# Patient Record
Sex: Female | Born: 1935 | Race: White | Hispanic: No | Smoking: Never smoker
Health system: Southern US, Community
[De-identification: ages and names within clinical notes are randomized; demographics above are authoritative.]

## PROBLEM LIST (undated history)

## (undated) DIAGNOSIS — K759 Inflammatory liver disease, unspecified: Secondary | ICD-10-CM

## (undated) DIAGNOSIS — M1990 Unspecified osteoarthritis, unspecified site: Secondary | ICD-10-CM

## (undated) DIAGNOSIS — E785 Hyperlipidemia, unspecified: Secondary | ICD-10-CM

## (undated) DIAGNOSIS — I10 Essential (primary) hypertension: Secondary | ICD-10-CM

## (undated) DIAGNOSIS — T7840XA Allergy, unspecified, initial encounter: Secondary | ICD-10-CM

## (undated) DIAGNOSIS — M899 Disorder of bone, unspecified: Secondary | ICD-10-CM

## (undated) DIAGNOSIS — D696 Thrombocytopenia, unspecified: Secondary | ICD-10-CM

## (undated) DIAGNOSIS — K219 Gastro-esophageal reflux disease without esophagitis: Secondary | ICD-10-CM

## (undated) DIAGNOSIS — F0390 Unspecified dementia without behavioral disturbance: Secondary | ICD-10-CM

## (undated) DIAGNOSIS — M949 Disorder of cartilage, unspecified: Secondary | ICD-10-CM

## (undated) HISTORY — DX: Hyperlipidemia, unspecified: E78.5

## (undated) HISTORY — DX: Disorder of cartilage, unspecified: M94.9

## (undated) HISTORY — PX: CARDIAC CATHETERIZATION: SHX172

## (undated) HISTORY — DX: Disorder of bone, unspecified: M89.9

## (undated) HISTORY — DX: Thrombocytopenia, unspecified: D69.6

## (undated) HISTORY — DX: Gastro-esophageal reflux disease without esophagitis: K21.9

## (undated) HISTORY — DX: Allergy, unspecified, initial encounter: T78.40XA

## (undated) HISTORY — DX: Essential (primary) hypertension: I10

## (undated) HISTORY — DX: Unspecified osteoarthritis, unspecified site: M19.90

## (undated) HISTORY — PX: ESOPHAGOGASTRODUODENOSCOPY: SHX1529

## (undated) HISTORY — DX: Inflammatory liver disease, unspecified: K75.9

---

## 2000-10-11 ENCOUNTER — Other Ambulatory Visit: Admission: RE | Admit: 2000-10-11 | Discharge: 2000-10-11 | Payer: Self-pay | Admitting: Family Medicine

## 2000-11-19 ENCOUNTER — Encounter: Admission: RE | Admit: 2000-11-19 | Discharge: 2000-11-19 | Payer: Self-pay | Admitting: Family Medicine

## 2000-11-19 ENCOUNTER — Encounter: Payer: Self-pay | Admitting: Family Medicine

## 2003-01-03 ENCOUNTER — Ambulatory Visit (HOSPITAL_COMMUNITY): Admission: RE | Admit: 2003-01-03 | Discharge: 2003-01-03 | Payer: Self-pay | Admitting: Gastroenterology

## 2003-01-03 ENCOUNTER — Encounter: Payer: Self-pay | Admitting: Gastroenterology

## 2004-12-29 ENCOUNTER — Ambulatory Visit: Payer: Self-pay | Admitting: Family Medicine

## 2005-01-01 ENCOUNTER — Ambulatory Visit: Payer: Self-pay | Admitting: Family Medicine

## 2005-01-27 ENCOUNTER — Ambulatory Visit: Payer: Self-pay | Admitting: Family Medicine

## 2005-04-09 ENCOUNTER — Ambulatory Visit: Payer: Self-pay | Admitting: Family Medicine

## 2005-06-03 ENCOUNTER — Ambulatory Visit: Payer: Self-pay | Admitting: Family Medicine

## 2005-07-16 ENCOUNTER — Ambulatory Visit: Payer: Self-pay | Admitting: Family Medicine

## 2005-09-04 ENCOUNTER — Ambulatory Visit: Payer: Self-pay | Admitting: Family Medicine

## 2005-09-07 ENCOUNTER — Ambulatory Visit: Payer: Self-pay | Admitting: Family Medicine

## 2005-09-21 ENCOUNTER — Ambulatory Visit: Payer: Self-pay | Admitting: Family Medicine

## 2005-10-08 ENCOUNTER — Ambulatory Visit: Payer: Self-pay | Admitting: Family Medicine

## 2005-11-24 ENCOUNTER — Ambulatory Visit: Payer: Self-pay | Admitting: Family Medicine

## 2006-02-09 ENCOUNTER — Ambulatory Visit: Payer: Self-pay | Admitting: Family Medicine

## 2006-03-10 ENCOUNTER — Ambulatory Visit: Payer: Self-pay | Admitting: Family Medicine

## 2006-06-04 ENCOUNTER — Ambulatory Visit: Payer: Self-pay | Admitting: Family Medicine

## 2006-09-09 ENCOUNTER — Ambulatory Visit: Payer: Self-pay | Admitting: Family Medicine

## 2006-10-01 ENCOUNTER — Ambulatory Visit: Payer: Self-pay | Admitting: Family Medicine

## 2007-10-04 ENCOUNTER — Ambulatory Visit: Payer: Self-pay | Admitting: Family Medicine

## 2008-08-10 ENCOUNTER — Ambulatory Visit: Payer: Self-pay | Admitting: Family Medicine

## 2008-08-10 DIAGNOSIS — D696 Thrombocytopenia, unspecified: Secondary | ICD-10-CM

## 2008-08-10 DIAGNOSIS — E785 Hyperlipidemia, unspecified: Secondary | ICD-10-CM | POA: Insufficient documentation

## 2008-08-10 DIAGNOSIS — M810 Age-related osteoporosis without current pathological fracture: Secondary | ICD-10-CM

## 2008-08-10 DIAGNOSIS — K219 Gastro-esophageal reflux disease without esophagitis: Secondary | ICD-10-CM | POA: Insufficient documentation

## 2008-08-10 DIAGNOSIS — I10 Essential (primary) hypertension: Secondary | ICD-10-CM | POA: Insufficient documentation

## 2008-08-10 DIAGNOSIS — J309 Allergic rhinitis, unspecified: Secondary | ICD-10-CM | POA: Insufficient documentation

## 2008-08-11 ENCOUNTER — Encounter: Payer: Self-pay | Admitting: Family Medicine

## 2008-08-14 ENCOUNTER — Encounter: Payer: Self-pay | Admitting: Family Medicine

## 2008-08-20 ENCOUNTER — Telehealth (INDEPENDENT_AMBULATORY_CARE_PROVIDER_SITE_OTHER): Payer: Self-pay | Admitting: *Deleted

## 2008-08-21 LAB — CONVERTED CEMR LAB
AST: 33 units/L (ref 0–37)
Albumin: 4.2 g/dL (ref 3.5–5.2)
Alkaline Phosphatase: 56 units/L (ref 39–117)
Basophils Absolute: 0 10*3/uL (ref 0.0–0.1)
Basophils Relative: 0.2 % (ref 0.0–3.0)
CO2: 28 meq/L (ref 19–32)
Calcium: 9.3 mg/dL (ref 8.4–10.5)
Cholesterol: 180 mg/dL (ref 0–200)
Creatinine, Ser: 0.9 mg/dL (ref 0.4–1.2)
Eosinophils Absolute: 0.2 10*3/uL (ref 0.0–0.7)
Eosinophils Relative: 2.7 % (ref 0.0–5.0)
GFR calc Af Amer: 78 mL/min
GFR calc non Af Amer: 64 mL/min
Hemoglobin: 13.1 g/dL (ref 12.0–15.0)
Lymphocytes Relative: 34.7 % (ref 12.0–46.0)
MCHC: 33.4 g/dL (ref 30.0–36.0)
MCV: 89.9 fL (ref 78.0–100.0)
Neutro Abs: 3.9 10*3/uL (ref 1.4–7.7)
Neutrophils Relative %: 53 % (ref 43.0–77.0)
RBC: 4.34 M/uL (ref 3.87–5.11)
Sodium: 139 meq/L (ref 135–145)
TSH: 1.78 microintl units/mL (ref 0.35–5.50)
Total Protein: 7.7 g/dL (ref 6.0–8.3)
WBC: 7.3 10*3/uL (ref 4.5–10.5)

## 2008-12-24 ENCOUNTER — Telehealth: Payer: Self-pay | Admitting: Family Medicine

## 2009-01-23 ENCOUNTER — Telehealth (INDEPENDENT_AMBULATORY_CARE_PROVIDER_SITE_OTHER): Payer: Self-pay | Admitting: *Deleted

## 2009-06-07 ENCOUNTER — Encounter (INDEPENDENT_AMBULATORY_CARE_PROVIDER_SITE_OTHER): Payer: Self-pay | Admitting: *Deleted

## 2009-07-11 ENCOUNTER — Telehealth: Payer: Self-pay | Admitting: Family Medicine

## 2009-07-17 ENCOUNTER — Telehealth: Payer: Self-pay | Admitting: Family Medicine

## 2009-12-09 ENCOUNTER — Telehealth: Payer: Self-pay | Admitting: Family Medicine

## 2009-12-11 ENCOUNTER — Ambulatory Visit: Payer: Self-pay | Admitting: Family Medicine

## 2009-12-14 LAB — CONVERTED CEMR LAB
AST: 33 units/L (ref 0–37)
Alkaline Phosphatase: 60 units/L (ref 39–117)
Basophils Relative: 0.6 % (ref 0.0–3.0)
Bilirubin, Direct: 0.1 mg/dL (ref 0.0–0.3)
Chloride: 102 meq/L (ref 96–112)
Cholesterol: 169 mg/dL (ref 0–200)
Eosinophils Absolute: 0.1 10*3/uL (ref 0.0–0.7)
Eosinophils Relative: 2.1 % (ref 0.0–5.0)
GFR calc non Af Amer: 64.03 mL/min (ref >60)
Glucose, Bld: 102 mg/dL — ABNORMAL HIGH (ref 70–99)
Hemoglobin: 13.1 g/dL (ref 12.0–15.0)
LDL Cholesterol: 57 mg/dL (ref 0–99)
Lymphocytes Relative: 27.3 % (ref 12.0–46.0)
MCHC: 33.2 g/dL (ref 30.0–36.0)
Monocytes Relative: 8.4 % (ref 3.0–12.0)
Neutro Abs: 4.2 10*3/uL (ref 1.4–7.7)
Neutrophils Relative %: 61.6 % (ref 43.0–77.0)
Phosphorus: 3.7 mg/dL (ref 2.3–4.6)
Potassium: 4.1 meq/L (ref 3.5–5.1)
RBC: 4.29 M/uL (ref 3.87–5.11)
Sodium: 137 meq/L (ref 135–145)
Total CHOL/HDL Ratio: 2
Total Protein: 8.3 g/dL (ref 6.0–8.3)
VLDL: 39.8 mg/dL (ref 0.0–40.0)
WBC: 6.8 10*3/uL (ref 4.5–10.5)

## 2009-12-16 ENCOUNTER — Encounter (INDEPENDENT_AMBULATORY_CARE_PROVIDER_SITE_OTHER): Payer: Self-pay | Admitting: *Deleted

## 2009-12-17 ENCOUNTER — Encounter: Payer: Self-pay | Admitting: Family Medicine

## 2010-02-25 ENCOUNTER — Telehealth: Payer: Self-pay | Admitting: Family Medicine

## 2010-02-25 ENCOUNTER — Ambulatory Visit: Payer: Self-pay | Admitting: Family Medicine

## 2010-02-26 ENCOUNTER — Encounter: Payer: Self-pay | Admitting: Family Medicine

## 2010-07-01 ENCOUNTER — Ambulatory Visit: Payer: Self-pay | Admitting: Family Medicine

## 2010-07-01 DIAGNOSIS — E559 Vitamin D deficiency, unspecified: Secondary | ICD-10-CM

## 2010-07-01 DIAGNOSIS — Z9181 History of falling: Secondary | ICD-10-CM

## 2010-07-03 LAB — CONVERTED CEMR LAB
ALT: 21 units/L (ref 0–35)
BUN: 14 mg/dL (ref 6–23)
Basophils Absolute: 0 10*3/uL (ref 0.0–0.1)
Chloride: 102 meq/L (ref 96–112)
Cholesterol: 173 mg/dL (ref 0–200)
Creatinine, Ser: 0.7 mg/dL (ref 0.4–1.2)
Direct LDL: 81 mg/dL
Eosinophils Absolute: 0.2 10*3/uL (ref 0.0–0.7)
GFR calc non Af Amer: 81.41 mL/min (ref >60)
HCT: 37.1 % (ref 36.0–46.0)
Lymphs Abs: 2.7 10*3/uL (ref 0.7–4.0)
MCV: 90.3 fL (ref 78.0–100.0)
Monocytes Absolute: 0.8 10*3/uL (ref 0.1–1.0)
Phosphorus: 3.8 mg/dL (ref 2.3–4.6)
Platelets: 120 10*3/uL — ABNORMAL LOW (ref 150.0–400.0)
RDW: 13.8 % (ref 11.5–14.6)
Sodium: 139 meq/L (ref 135–145)
Total CHOL/HDL Ratio: 3
Triglycerides: 228 mg/dL — ABNORMAL HIGH (ref 0.0–149.0)
VLDL: 45.6 mg/dL — ABNORMAL HIGH (ref 0.0–40.0)

## 2010-09-15 ENCOUNTER — Ambulatory Visit: Payer: Self-pay | Admitting: Family Medicine

## 2010-09-16 ENCOUNTER — Telehealth: Payer: Self-pay | Admitting: Family Medicine

## 2010-09-16 LAB — CONVERTED CEMR LAB
Basophils Absolute: 0 10*3/uL (ref 0.0–0.1)
Lymphocytes Relative: 32.9 % (ref 12.0–46.0)
Lymphs Abs: 2.4 10*3/uL (ref 0.7–4.0)
Monocytes Relative: 8.1 % (ref 3.0–12.0)
Neutrophils Relative %: 56.8 % (ref 43.0–77.0)
Platelets: 117 10*3/uL — ABNORMAL LOW (ref 150.0–400.0)
RDW: 13.6 % (ref 11.5–14.6)
Vit D, 25-Hydroxy: 38 ng/mL (ref 30–89)

## 2010-10-27 ENCOUNTER — Encounter: Payer: Self-pay | Admitting: Family Medicine

## 2010-12-29 ENCOUNTER — Encounter: Payer: Self-pay | Admitting: Family Medicine

## 2010-12-29 ENCOUNTER — Ambulatory Visit
Admission: RE | Admit: 2010-12-29 | Discharge: 2010-12-29 | Payer: Self-pay | Source: Home / Self Care | Attending: Family Medicine | Admitting: Family Medicine

## 2010-12-29 ENCOUNTER — Other Ambulatory Visit: Payer: Self-pay | Admitting: Family Medicine

## 2010-12-29 DIAGNOSIS — K120 Recurrent oral aphthae: Secondary | ICD-10-CM | POA: Insufficient documentation

## 2010-12-29 DIAGNOSIS — G3184 Mild cognitive impairment, so stated: Secondary | ICD-10-CM | POA: Insufficient documentation

## 2010-12-29 DIAGNOSIS — F0390 Unspecified dementia without behavioral disturbance: Secondary | ICD-10-CM | POA: Insufficient documentation

## 2010-12-30 LAB — CBC WITH DIFFERENTIAL/PLATELET
Basophils Absolute: 0 10*3/uL (ref 0.0–0.1)
Basophils Relative: 0.6 % (ref 0.0–3.0)
Eosinophils Absolute: 0.2 10*3/uL (ref 0.0–0.7)
Eosinophils Relative: 2.5 % (ref 0.0–5.0)
HCT: 39.6 % (ref 36.0–46.0)
Hemoglobin: 13.4 g/dL (ref 12.0–15.0)
Lymphocytes Relative: 31 % (ref 12.0–46.0)
Lymphs Abs: 2.1 10*3/uL (ref 0.7–4.0)
MCHC: 33.8 g/dL (ref 30.0–36.0)
MCV: 87.3 fl (ref 78.0–100.0)
Monocytes Absolute: 0.6 10*3/uL (ref 0.1–1.0)
Monocytes Relative: 8.8 % (ref 3.0–12.0)
Neutro Abs: 3.9 10*3/uL (ref 1.4–7.7)
Neutrophils Relative %: 57.1 % (ref 43.0–77.0)
Platelets: 126 10*3/uL — ABNORMAL LOW (ref 150.0–400.0)
RBC: 4.53 Mil/uL (ref 3.87–5.11)
RDW: 13.6 % (ref 11.5–14.6)
WBC: 6.8 10*3/uL (ref 4.5–10.5)

## 2010-12-30 LAB — SEDIMENTATION RATE: Sed Rate: 21 mm/hr (ref 0–22)

## 2010-12-30 LAB — T4, FREE: Free T4: 0.65 ng/dL (ref 0.60–1.60)

## 2010-12-30 LAB — LIPID PANEL
Cholesterol: 176 mg/dL (ref 0–200)
HDL: 61.8 mg/dL (ref >39.00)
LDL Cholesterol: 81 mg/dL (ref 0–99)
Total CHOL/HDL Ratio: 3
Triglycerides: 168 mg/dL — ABNORMAL HIGH (ref 0.0–149.0)
VLDL: 33.6 mg/dL (ref 0.0–40.0)

## 2010-12-30 LAB — RENAL FUNCTION PANEL
Albumin: 4.2 g/dL (ref 3.5–5.2)
BUN: 15 mg/dL (ref 6–23)
CO2: 27 mEq/L (ref 19–32)
Calcium: 9.6 mg/dL (ref 8.4–10.5)
Chloride: 103 mEq/L (ref 96–112)
Creatinine, Ser: 1 mg/dL (ref 0.4–1.2)
GFR: 59.99 mL/min — ABNORMAL LOW (ref >60.00)
Glucose, Bld: 85 mg/dL (ref 70–99)
Phosphorus: 3.9 mg/dL (ref 2.3–4.6)
Potassium: 4.4 mEq/L (ref 3.5–5.1)
Sodium: 139 mEq/L (ref 135–145)

## 2010-12-30 LAB — B12 AND FOLATE PANEL
Folate: >20 ng/mL
Vitamin B-12: 387 pg/mL (ref 211–911)

## 2010-12-30 LAB — AST: AST: 29 U/L (ref 0–37)

## 2010-12-30 LAB — ALT: ALT: 23 U/L (ref 0–35)

## 2010-12-30 LAB — TSH: TSH: 2.71 u[IU]/mL (ref 0.35–5.50)

## 2010-12-31 ENCOUNTER — Telehealth: Payer: Self-pay | Admitting: Family Medicine

## 2011-01-03 LAB — CONVERTED CEMR LAB: Vit D, 25-Hydroxy: 23 ng/mL — ABNORMAL LOW (ref 30–89)

## 2011-01-20 NOTE — Miscellaneous (Signed)
Summary: Flu vaccine   Clinical Lists Changes  Observations: Added new observation of FLU VAX: Historical (10/21/2010 16:53)      Influenza Immunization History:    Influenza # 1:  Historical (10/21/2010) Received form from [facility], [address] [place], [place]

## 2011-01-20 NOTE — Progress Notes (Signed)
Summary: Request med for allergies and sinus  Phone Note Call from Patient Call back at [phone]   Caller: Patient Call For: Judith Part MD Summary of Call: [facility] on [address] does not have Allergra as prescription drug since it went over the counter. I spoke with rebecca at [facility] aid and she said they could not get it since went OTC. Lurena Joiner said generic Claritin and generic Zyrtec are available for prescription if Dr Milinda Antis wanted to use those as an option. Pt said cannot afford OTC and wants [facility] to call in prescription med for sinus and allergies. Pt uses [facility] on [address]. [phone]. Please advise.  Initial call taken by: Lewanda Rife LPN,  September 16, 2010 4:28 PM  Follow-up for Phone Call        she is most likely going to have to use one of these meds otc ... just so she knows store brands (like [facility] or [facility] ) are ok and may be cheaper she can also use her nasonex ns (let me know if she needs px)- this should help as well Follow-up by: Judith Part MD,  September 16, 2010 4:53 PM  Additional Follow-up for Phone Call Additional follow up Details #1::        Patient notified as instructed by telephone. Lewanda Rife LPN  September 16, 2010 5:15 PM

## 2011-01-20 NOTE — Assessment & Plan Note (Signed)
Summary: 6 month follow up /rbh   Vital Signs:  Patient profile:   77 year old female Height:      60 inches Weight:      151.50 pounds BMI:     29.69 Temp:     98 degrees F oral Pulse rate:   72 / minute Pulse rhythm:   regular BP sitting:   146 / 70  (left arm) Cuff size:   regular  Vitals Entered By: Lewanda Rife LPN (July 01, 2010 2:09 PM) CC: six month f/u   History of Present Illness: here for f/u of chronic health problems incl HTN and lipids/ OP and low platelets and low vit D  had a fall the other day and got a knot on her head  slid out of her shoes with hose and tripped - was wearing sandals  she has since thrown the shoes away  tumbled face down on the floor  head feels better - lump is almost gone -- still a little tender no headache or nausea   otherwise doing ok -- more tired as she gets older    has had 2 rounds of high dose vit d currently takes none -- forgot   last LDL on zocor was 57- good control  bp at arrival is 146.70  OP - did not get her bone density test last time-- does not have a ride so cannot go for a while    last platelet ct was 140 improved  no bruising or easy bleeding that she has noticed   Allergies: 1)  ! Ace Inhibitors 2)  ! Fosamax  Past History:  Past Surgical History: Last updated: 08/10/2008 ENG 01 [place] abd nl 03 2D echo nl EF 95 heart cath 04 colonosc hemorroids 04 EGD gastritis, HH, duodenitis  05 cataract sx 05 abd [place] neg  comp fx LS -- OP (dexa 99 OP)   Family History: Last updated: 08/10/2008 mother- alzheimers   Social History: Last updated: 07/01/2010 does word puzzles to keep memory sharp  enjoys reading is active but no longer drives   Past Medical History: HTN  wrist fractures , OP hyperlipidemia all rhinitis  thrombocytomenia- mild  pulm HTN GERD (hx of duodenal ulcer) hepatitis  OA osteopenia  cardiol [facility]  [facility]o [facility]   Social History: does word puzzles to keep  memory sharp  enjoys reading is active but no longer drives   Review of Systems General:  Denies fatigue, loss of appetite, and malaise. Eyes:  Denies blurring and eye irritation. CV:  Denies chest pain or discomfort, palpitations, shortness of breath with exertion, and swelling of feet. Resp:  Denies cough, shortness of breath, and wheezing. GI:  Denies abdominal pain, change in bowel habits, and indigestion. GU:  Denies dysuria and urinary frequency. MS:  Complains of stiffness; denies cramps and muscle weakness. Derm:  Denies lesion(s), poor wound healing, and rash. Neuro:  Denies numbness and tingling. Psych:  Denies anxiety and depression; more trouble with short term memory- but mild. Endo:  Denies cold intolerance, excessive thirst, excessive urination, and heat intolerance. Heme:  Denies abnormal bruising and bleeding.  Physical Exam  General:  well appearing elderly female Head:  normocephalic and no abnormalities observed.  very small lump on L side of head - mildly tender without bruise no facial changes  Eyes:  vision grossly intact, pupils equal, pupils round, and pupils reactive to light.  no conjunctival pallor, injection or icterus  Nose:  no nasal discharge.  Mouth:  pharynx pink and moist.   Neck:  supple with full rom and no masses or thyromegally, no JVD or carotid bruit  Chest Wall:  No deformities, masses, or tenderness noted. Lungs:  Normal respiratory effort, chest expands symmetrically. Lungs are clear to auscultation, no crackles or wheezes. Heart:  Normal rate and regular rhythm. S1 and S2 normal without gallop, murmur, click, rub or other extra sounds. Abdomen:  Bowel sounds positive,abdomen soft and non-tender without masses, organomegaly or hernias noted. no renal bruits  Msk:  No deformity or scoliosis noted of thoracic or lumbar spine.  some OA changes  no joint tenderness Pulses:  plus one dp pulses  Extremities:  No clubbing, cyanosis, edema, or  deformity noted with normal full range of motion of all joints.   Neurologic:  sensation intact to light touch, gait normal, and DTRs symmetrical and normal.  balance is not comprimised  Skin:  Intact without suspicious lesions or rashes fair complexion without pallor  no ecchymoses Cervical Nodes:  No lymphadenopathy noted Inguinal Nodes:  No significant adenopathy Psych:  normal affect, talkative and pleasant  pt talks about general memory problems but is very mentally sharp today     Impression & Recommendations:  Problem # 1:  UNSPECIFIED VITAMIN D DEFICIENCY (ICD-268.9) Assessment Unchanged pt has not been compliant with dosing explained imp to her bone and overall health will start 2000 international units per day check level today (did have 2 rounds of high dose tx) Orders: Venipuncture (54098) TLB-Lipid Panel (80061-LIPID) TLB-Renal Function Panel (80069-RENAL) TLB-ALT (SGPT) (84460-ALT) TLB-AST (SGOT) (84450-SGOT) TLB-TSH (Thyroid Stimulating Hormone) (84443-TSH) T-Vitamin D (25-Hydroxy) (11914-78295) Specimen Handling (62130)  Problem # 2:  OSTEOPOROSIS (ICD-733.00) Assessment: Unchanged with need for f/u dexa - but pt unable to go until she has a ride she has the order when she gets the chance rev ca and vit D req  continues boniva Her updated medication list for this problem includes:    Boniva 150 Mg Tabs (Ibandronate sodium) .Marland Kitchen... 1 by mouth once monthly    Calcium 600/vitamin D 600-200 Mg-unit Tabs (Calcium carbonate-vitamin d) .Marland Kitchen... Take 1 tablet by mouth two times a day    Vitamin D (ergocalciferol) 50000 Unit Caps (Ergocalciferol) .Marland Kitchen... Take one by mouth weekly x 10 weeks    Vitamin D 2000 Unit Tabs (Cholecalciferol) .Marland Kitchen... Take one tablet daily  Orders: Venipuncture (86578) TLB-Lipid Panel (80061-LIPID) TLB-Renal Function Panel (80069-RENAL) TLB-ALT (SGPT) (84460-ALT) TLB-AST (SGOT) (84450-SGOT) TLB-TSH (Thyroid Stimulating Hormone)  (84443-TSH)  Problem # 3:  THROMBOCYTOPENIA, CHRONIC (ICD-287.5) Assessment: Improved check labs today- platelet up to 140 last time no bleeding or bruising  Orders: Venipuncture (46962) TLB-Lipid Panel (80061-LIPID) TLB-Renal Function Panel (80069-RENAL) TLB-ALT (SGPT) (84460-ALT) TLB-AST (SGOT) (84450-SGOT) TLB-TSH (Thyroid Stimulating Hormone) (84443-TSH) TLB-CBC Platelet - w/Differential (85025-CBCD)  Problem # 4:  ESSENTIAL HYPERTENSION (ICD-401.9) Assessment: Improved  this is stable without med change reminded to avoid salt in diet  Her updated medication list for this problem includes:    Toprol Xl 50 Mg Tb24 (Metoprolol succinate) .Marland Kitchen... Take 1 tablet by mouth once a day    Diovan 80 Mg Tabs (Valsartan) .Marland Kitchen... 1 by mouth once daily  Orders: Venipuncture (95284) TLB-Lipid Panel (80061-LIPID) TLB-Renal Function Panel (80069-RENAL) TLB-ALT (SGPT) (84460-ALT) TLB-AST (SGOT) (84450-SGOT) TLB-TSH (Thyroid Stimulating Hormone) (84443-TSH)  BP today: 146/70-- re check 140/80 at rest  Prior BP: 154/74 (12/11/2009)  Labs Reviewed: K+: 4.1 (12/11/2009) Creat: : 0.9 (12/11/2009)   Chol: 169 (12/11/2009)   HDL: 72.50 (12/11/2009)  LDL: 57 (12/11/2009)   TG: 199.0 (12/11/2009)  Problem # 5:  HYPERLIPIDEMIA (ICD-272.4) Assessment: Unchanged  this has been well controlled on simvastatin and diet  lab today and update  f/u 6 mo  Her updated medication list for this problem includes:    Simvastatin 20 Mg Tabs (Simvastatin) .Marland Kitchen... Take 1 tablet by mouth once a day  Orders: Venipuncture (95284) TLB-Lipid Panel (80061-LIPID) TLB-Renal Function Panel (80069-RENAL) TLB-ALT (SGPT) (84460-ALT) TLB-AST (SGOT) (84450-SGOT) TLB-TSH (Thyroid Stimulating Hormone) (84443-TSH)  Labs Reviewed: SGOT: 33 (12/11/2009)   SGPT: 27 (12/11/2009)   HDL:72.50 (12/11/2009), 67.6 (08/10/2008)  LDL:57 (12/11/2009), 83 (08/10/2008)  Chol:169 (12/11/2009), 180 (08/10/2008)  Trig:199.0 (12/11/2009),  145 (08/10/2008)  Problem # 6:  FALL, HX OF (ICD-V15.88) Assessment: New full recovery with bump on head diminished and no fx  disc safety in shoe wear and risk of fx with OP  overall balance is fair for her age   Complete Medication List: 1)  Boniva 150 Mg Tabs (Ibandronate sodium) .Marland Kitchen.. 1 by mouth once monthly 2)  Simvastatin 20 Mg Tabs (Simvastatin) .... Take 1 tablet by mouth once a day 3)  Cod Liver Oil Caps (Cod liver oil) .... Take 1 capsule by mouth once a day as needed 4)  Allegra 180 Mg Tabs (Fexofenadine hcl) .... Take 1 tablet by mouth once a day as needed 5)  Toprol Xl 50 Mg Tb24 (Metoprolol succinate) .... Take 1 tablet by mouth once a day 6)  Calcium 600/vitamin D 600-200 Mg-unit Tabs (Calcium carbonate-vitamin d) .... Take 1 tablet by mouth two times a day 7)  Nasonex 50 Mcg/act Susp (Mometasone furoate) .... As needed 8)  Relafen 500 Mg  .... As needed 9)  Diovan 80 Mg Tabs (Valsartan) .Marland Kitchen.. 1 by mouth once daily 10)  Vitamin D (ergocalciferol) 50000 Unit Caps (Ergocalciferol) .... Take one by mouth weekly x 10 weeks 11)  Vitamin D 2000 Unit Tabs (Cholecalciferol) .... Take one tablet daily  Patient Instructions: 1)  you absolutely have to take calcium 1200 mg daily ( 600 in am 600 in pm)  2)  and you need 2000 international units of vitamin D per day  3)  this is really important for your bones and may make you feel better too  4)  go for your bone density test when you have a chance 5)  labs today 6)  follow up with me in about 6 months   Current Allergies (reviewed today): ! ACE INHIBITORS ! [facility]

## 2011-01-20 NOTE — Miscellaneous (Signed)
Summary: Vitamin D 2000 iu update on med list  Medications Added VITAMIN D 2000 UNIT TABS (CHOLECALCIFEROL) take one tablet daily       Clinical Lists Changes  Medications: Added new medication of VITAMIN D 2000 UNIT TABS (CHOLECALCIFEROL) take one tablet daily     Current Allergies: ! ACE INHIBITORS ! FOSAMAX

## 2011-01-20 NOTE — Progress Notes (Signed)
Summary: rash on leg  Phone Note Call from Patient Call back at Home Phone [phone]   Caller: Patient Call For: Judith Part MD Summary of Call: Patient says that she has been having rash on her legs right above ankle. This has been going on for quite some time. She says that it gets worse the more she scratches it. I suggested that she make an appointment, but she denied it. She says that she would just like for you to either call her in soemething for the rash or giver her suggestions on what to use for it. Uses [facility] [address] if needed. Initial call taken by: Melody Comas,  February 25, 2010 2:17 PM  Follow-up for Phone Call        I cannot tx without evaluating to see what type of rash  she can try some cort aid otc but if no imp f/u  Follow-up by: Judith Part MD,  February 25, 2010 2:33 PM  Additional Follow-up for Phone Call Additional follow up Details #1::        Patient notified as instructed by telephone. Lewanda Rife LPN  February 26, 980 3:16 PM

## 2011-01-22 NOTE — Assessment & Plan Note (Signed)
Summary: ROA FOR 6 MONTH FOLLOW-UP/JRR   Vital Signs:  Patient profile:   77 year old female Height:      60 inches Weight:      147.25 pounds BMI:     28.86 Temp:     97.7 degrees F oral Pulse rate:   76 / minute Pulse rhythm:   regular BP sitting:   156 / 72  (left arm) Cuff size:   regular  Vitals Entered By: Lewanda Rife LPN (December 29, 2010 2:15 PM) CC: six month f/u, tongue bothers her and pt wants to discuss memory.   History of Present Illness: here for f/u of HTN and lpids and thrombocytopenia  also acute problem of toungue bothering her and memory problem   wt is down 4 lb  HTN - first bp check is 156/72 is not entirely sure she took her med this am    lipids were good in july on statin takes statin every day  diet is fair overall    due for platelet check - watching asymptomatic thrombocytopenia  not bruising easily   in past she used to take vit B12  will do some memory labs this am  forgetting to take her medicines  is a Product/process development scientist -- forgets more when she worries  occ forgets to leave light on in the kitchen   some trouble sleeping at times  gets confused when she gets upset   may have forgotton to take ca and vit D   stopped allegra since it went over the counter   her toungue is bothering her -- soreness , over sides of her toungue  occ burns her tougue         Allergies: 1)  ! Ace Inhibitors 2)  ! Fosamax  Past History:  Past Medical History: Last updated: 07/01/2010 HTN  wrist fractures , OP hyperlipidemia all rhinitis  thrombocytomenia- mild  pulm HTN GERD (hx of duodenal ulcer) hepatitis  OA osteopenia  cardiol [facility] [place] [facility]   Past Surgical History: Last updated: 08/10/2008 ENG 01 [place] abd nl 03 2D echo nl EF 95 heart cath 04 colonosc hemorroids 04 EGD gastritis, HH, duodenitis  05 cataract sx 05 abd [place] neg  comp fx LS -- OP (dexa 99 OP)   Family History: Last updated: 08/10/2008 mother-  alzheimers   Social History: Last updated: 07/01/2010 does word puzzles to keep memory sharp  enjoys reading is active but no longer drives   Review of Systems General:  Denies fatigue, loss of appetite, and malaise. Eyes:  Denies blurring and eye irritation. ENT:  Denies postnasal drainage and sore throat. CV:  Denies chest pain or discomfort, lightheadness, palpitations, shortness of breath with exertion, and swelling of feet. Resp:  Denies cough, shortness of breath, and wheezing. GI:  Denies abdominal pain, bloody stools, change in bowel habits, and indigestion. MS:  Denies cramps and muscle weakness. Derm:  Denies itching, lesion(s), poor wound healing, and rash. Neuro:  Complains of memory loss; denies falling down, headaches, numbness, and tingling. Psych:  Complains of anxiety; denies panic attacks, sense of great danger, and suicidal thoughts/plans. Endo:  Denies cold intolerance, excessive thirst, excessive urination, and heat intolerance. Heme:  Denies abnormal bruising and bleeding.  Physical Exam  General:  well appearing elderly female Head:  normocephalic, atraumatic, and no abnormalities observed.  no sinus tenderness  Eyes:  vision grossly intact, pupils equal, pupils round, and pupils reactive to light.  no conjunctival pallor, injection  or icterus  Ears:  R ear normal and L ear normal.   Nose:  no nasal discharge.   Mouth:  pharynx pink and moist.   some irritation on sides of tougue (suspect from malfitting dentures) throat is clear no thrush or coating noted  Neck:  supple with full rom and no masses or thyromegally, no JVD or carotid bruit  Chest Wall:  No deformities, masses, or tenderness noted. Lungs:  Normal respiratory effort, chest expands symmetrically. Lungs are clear to auscultation, no crackles or wheezes. Heart:  Normal rate and regular rhythm. S1 and S2 normal without gallop, murmur, click, rub or other extra sounds. Abdomen:  Bowel sounds  positive,abdomen soft and non-tender without masses, organomegaly or hernias noted. Msk:  No deformity or scoliosis noted of thoracic or lumbar spine.  some OA changes  no joint tenderness Pulses:  plus one dp pulses  Extremities:  No clubbing, cyanosis, edema, or deformity noted with normal full range of motion of all joints.   Neurologic:  sensation intact to light touch, gait normal, and DTRs symmetrical and normal.  balance is not comprimised  Skin:  Intact without suspicious lesions or rashes Cervical Nodes:  No lymphadenopathy noted Psych:  pt is forgetful - short term memory  is very confused about medicines- moreso when they are explained to her  tends not to listen or concentrate - with flight of ideas and frequent interruptions (interjects things randomly into conversation) no signs of depression - but may be a bit anxious     Impression & Recommendations:  Problem # 1:  MEMORY LOSS (ICD-780.93) Assessment New pt is concerned about this and seems easily confused- esp about meds  will start needing some help at home labs today for memory loss f/u with friend or family member to do MMS exam and disc further  Orders: Venipuncture (40981) TLB-Lipid Panel (80061-LIPID) TLB-Renal Function Panel (80069-RENAL) TLB-CBC Platelet - w/Differential (85025-CBCD) TLB-TSH (Thyroid Stimulating Hormone) (84443-TSH) TLB-B12 + Folate Pnl (19147_82956-O13/YQM) TLB-ALT (SGPT) (84460-ALT) TLB-AST (SGOT) (84450-SGOT) TLB-T4 (Thyrox), Free [id number]) TLB-Sedimentation Rate (ESR) (85652-ESR) T-Vitamin D (25-Hydroxy) (52841-32440)  Problem # 2:  UNSPECIFIED VITAMIN D DEFICIENCY (ICD-268.9) Assessment: Deteriorated pt forgets to take her vit D otc stressed imp of this  rev med list in detail check level Orders: Venipuncture (10272) TLB-Lipid Panel (80061-LIPID) TLB-Renal Function Panel (80069-RENAL) TLB-CBC Platelet - w/Differential (85025-CBCD) TLB-TSH (Thyroid Stimulating Hormone)  (84443-TSH) TLB-B12 + Folate Pnl (53664_40347-Q25/ZDG) TLB-ALT (SGPT) (84460-ALT) TLB-AST (SGOT) (84450-SGOT) TLB-T4 (Thyrox), Free (38756-EP3I) TLB-Sedimentation Rate (ESR) (85652-ESR) T-Vitamin D (25-Hydroxy) (95188-41660) Specimen Handling (63016)  Problem # 3:  THROMBOCYTOPENIA, CHRONIC (ICD-287.5) Assessment: Unchanged check cbc  no bruising or other symptoms mild and being observed   Problem # 4:  ESSENTIAL HYPERTENSION (ICD-401.9) Assessment: Deteriorated  bp up -- probably forgot her med this am- she is not sure  will re check at next visit  Her updated medication list for this problem includes:    Toprol Xl 50 Mg Tb24 (Metoprolol succinate) .Marland Kitchen... Take 1 tablet by mouth once a day    Diovan 80 Mg Tabs (Valsartan) .Marland Kitchen... 1 by mouth once daily  Orders: Venipuncture (01093) TLB-Lipid Panel (80061-LIPID) TLB-Renal Function Panel (80069-RENAL) TLB-CBC Platelet - w/Differential (85025-CBCD) TLB-TSH (Thyroid Stimulating Hormone) (84443-TSH) TLB-B12 + Folate Pnl (23557_32202-R42/HCW) TLB-ALT (SGPT) (84460-ALT) TLB-AST (SGOT) (84450-SGOT) TLB-T4 (Thyrox), Free [phone]) TLB-Sedimentation Rate (ESR) (85652-ESR) T-Vitamin D (25-Hydroxy) (17616-07371)  BP today: 156/72 Prior BP: 146/70 (07/01/2010)  Labs Reviewed: K+: 4.3 (07/01/2010) Creat: : 0.7 (07/01/2010)   Chol: 173 (  07/01/2010)   HDL: 66.30 (07/01/2010)   LDL: 57 (12/11/2009)   TG: 228.0 (07/01/2010)  Problem # 5:  HYPERLIPIDEMIA (ICD-272.4) Assessment: Unchanged  lipds today on statin and fair diet rev low sat fat diet  Her updated medication list for this problem includes:    Simvastatin 20 Mg Tabs (Simvastatin) .Marland Kitchen... Take 1 tablet by mouth once a day  Orders: Venipuncture (16109) TLB-Lipid Panel (80061-LIPID) TLB-Renal Function Panel (80069-RENAL) TLB-CBC Platelet - w/Differential (85025-CBCD) TLB-TSH (Thyroid Stimulating Hormone) (84443-TSH) TLB-B12 + Folate Pnl (60454_09811-B14/NWG) TLB-ALT (SGPT)  (84460-ALT) TLB-AST (SGOT) (84450-SGOT) TLB-T4 (Thyrox), Free [phone]) TLB-Sedimentation Rate (ESR) (85652-ESR) T-Vitamin D (25-Hydroxy) [id number])  Labs Reviewed: SGOT: 27 (07/01/2010)   SGPT: 21 (07/01/2010)   HDL:66.30 (07/01/2010), 72.50 (12/11/2009)  LDL:57 (12/11/2009), 83 (08/10/2008)  Chol:173 (07/01/2010), 169 (12/11/2009)  Trig:228.0 (07/01/2010), 199.0 (12/11/2009)  Problem # 6:  ORAL APHTHAE (ICD-528.2) Assessment: New I suspect this is intermittent from malfitting dentures  pt cannot afford new ones at this time she will see dentist to work out payment plan to get new dentures fitted  trial of dukes magic mouthwash also checking B12 and folate  Complete Medication List: 1)  Boniva 150 Mg Tabs (Ibandronate sodium) .Marland Kitchen.. 1 by mouth once monthly 2)  Simvastatin 20 Mg Tabs (Simvastatin) .... Take 1 tablet by mouth once a day 3)  Toprol Xl 50 Mg Tb24 (Metoprolol succinate) .... Take 1 tablet by mouth once a day 4)  Calcium 600/vitamin D 600-200 Mg-unit Tabs (Calcium carbonate-vitamin d) .... Take 1 tablet by mouth two times a day 5)  Nasonex 50 Mcg/act Susp (Mometasone furoate) .... As needed 6)  Diovan 80 Mg Tabs (Valsartan) .Marland Kitchen.. 1 by mouth once daily 7)  Vitamin D 2000 Unit Tabs (Cholecalciferol) .... Take one tablet daily 8)  Dukes Magic Mouthwash  .... Swish and spit 1 teaspoon two times a day as needed for mouth discomfort  Patient Instructions: 1)  let me know when you get home if you took your med or not -- just leave a messager with the triage nurse  2)  follow up with your dentist if you do not think dentures fit  3)  try the magic mouthwash for mouth discomfort - here is a px  4)  labs today- for cholesterol , platelet count, vitaminD and some labs for memory loss 5)  please follow up with a friend or family member (this is most important) to discuss memory loss when able (30 min appt)  6)  we will review your labs at next visit as well  7)  you must get  back on your calcium and vitamin D ! 8)  follow your medicine list  9)  put your pill box where you can see it more easily Prescriptions: DUKES MAGIC MOUTHWASH swish and spit 1 teaspoon two times a day as needed for mouth discomfort  #120cc x 0   Entered and Authorized by:   Judith Part MD   Signed by:   Judith Part MD on 12/29/2010   Method used:   Print then Give to Patient   RxID:   [id number]    Orders Added: 1)  Venipuncture [66440] 2)  TLB-Lipid Panel [80061-LIPID] 3)  TLB-Renal Function Panel [80069-RENAL] 4)  TLB-CBC Platelet - w/Differential [85025-CBCD] 5)  TLB-TSH (Thyroid Stimulating Hormone) [84443-TSH] 6)  TLB-B12 + Folate Pnl [82746_82607-B12/FOL] 7)  TLB-ALT (SGPT) [84460-ALT] 8)  TLB-AST (SGOT) [84450-SGOT] 9)  TLB-T4 (Thyrox), Free [34742-VZ5G] 10)  TLB-Sedimentation Rate (ESR) [85652-ESR] 11)  T-Vitamin  D (25-Hydroxy) [phone] 12)  Specimen Handling [99000] 13)  Est. Patient Level IV [27253]    Current Allergies (reviewed today): ! ACE INHIBITORS ! FOSAMAX

## 2011-01-22 NOTE — Progress Notes (Signed)
Summary: does pt need to be seen again?  Phone Note Call from Patient   Caller: Daughter Amanda Salas [phone] Call For: Judith Part MD Summary of Call: Pt's daughter is asking if pt needs to come in for memory test.  Pt was seen on monday. Initial call taken by: Lowella Petties CMA, AAMA,  December 31, 2010 10:49 AM  Follow-up for Phone Call        I asked her to follow up with friend or family membert to review labs and do memory test  I am worried about her memory -- and have a difficult time communicating things with her please f/u with pt whenever your schedule allows (please make appt 30 min)  Follow-up by: Judith Part MD,  December 31, 2010 12:15 PM  Additional Follow-up for Phone Call Additional follow up Details #1::        Left message for Amanda Salas  to call back. Lewanda Rife LPN  December 31, 2010 1:32 PM   Amanda Salas called back and said she would talk with pt and then call back and schedule a 30 min appt with Dr. Milinda Antis.Lewanda Rife LPN  December 31, 2010 1:48 PM

## 2011-01-23 NOTE — Letter (Signed)
Summary: Patient Assessment Form/[employer]  Patient Assessment Form/[employer]   Imported By: Lanelle Bal 12/23/2009 12:20:37  _____________________________________________________________________  External Attachment:    Type:   Image     Comment:   External Document

## 2011-06-06 ENCOUNTER — Other Ambulatory Visit: Payer: Self-pay | Admitting: Family Medicine

## 2011-08-07 ENCOUNTER — Other Ambulatory Visit: Payer: Self-pay | Admitting: Family Medicine

## 2011-08-07 NOTE — Telephone Encounter (Signed)
[facility] request refill for Diovan 80mg  #30 x 1.Spoke with pt and she has to get a ride to her appts; so she will ck with the person that will bring her to the appt about available times she could bring pt and pt will call back for appt.

## 2011-10-06 ENCOUNTER — Other Ambulatory Visit: Payer: Self-pay | Admitting: Family Medicine

## 2011-11-04 ENCOUNTER — Encounter: Payer: Self-pay | Admitting: Internal Medicine

## 2011-11-04 ENCOUNTER — Ambulatory Visit (INDEPENDENT_AMBULATORY_CARE_PROVIDER_SITE_OTHER): Payer: PRIVATE HEALTH INSURANCE | Admitting: Internal Medicine

## 2011-11-04 VITALS — BP 190/80 | HR 93 | Temp 98.2°F | Ht 60.0 in | Wt 145.0 lb

## 2011-11-04 DIAGNOSIS — L259 Unspecified contact dermatitis, unspecified cause: Secondary | ICD-10-CM

## 2011-11-04 DIAGNOSIS — R413 Other amnesia: Secondary | ICD-10-CM

## 2011-11-04 DIAGNOSIS — B351 Tinea unguium: Secondary | ICD-10-CM

## 2011-11-04 DIAGNOSIS — Z23 Encounter for immunization: Secondary | ICD-10-CM

## 2011-11-04 DIAGNOSIS — I10 Essential (primary) hypertension: Secondary | ICD-10-CM

## 2011-11-04 LAB — CBC WITH DIFFERENTIAL/PLATELET
Basophils Absolute: 0 10*3/uL (ref 0.0–0.1)
Eosinophils Absolute: 0.2 10*3/uL (ref 0.0–0.7)
HCT: 39.2 % (ref 36.0–46.0)
Hemoglobin: 12.9 g/dL (ref 12.0–15.0)
Lymphs Abs: 2.5 10*3/uL (ref 0.7–4.0)
MCHC: 32.9 g/dL (ref 30.0–36.0)
Monocytes Relative: 7.3 % (ref 3.0–12.0)
Neutro Abs: 5.1 10*3/uL (ref 1.4–7.7)
RDW: 14.9 % — ABNORMAL HIGH (ref 11.5–14.6)

## 2011-11-04 MED ORDER — METOPROLOL SUCCINATE ER 50 MG PO TB24: 50.0000 mg | ORAL_TABLET | Freq: Every day | ORAL | Status: DC

## 2011-11-04 MED ORDER — PREDNISONE 20 MG PO TABS: 40.0000 mg | ORAL_TABLET | Freq: Every day | ORAL | Status: AC

## 2011-11-04 MED ORDER — VALSARTAN 80 MG PO TABS: 40.0000 mg | ORAL_TABLET | Freq: Every day | ORAL | Status: DC

## 2011-11-04 NOTE — Assessment & Plan Note (Signed)
Persists Hasn't kept up with follow up Will stop statin now in case this could be related Follow up in 1 month with Dr Milinda Antis

## 2011-11-04 NOTE — Progress Notes (Signed)
Addended by: Sueanne Margarita on: 11/04/2011 12:37 PM   Modules accepted: Orders

## 2011-11-04 NOTE — Progress Notes (Signed)
  Subjective:    Patient ID: Amanda Salas, female    DOB: 10-13-1927, 77 y.o.   MRN: [id number]  HPI Rash started on calves about 1-2 weeks ago Mild and then worsened Knows she has poison oak or ivy in yard--may have been exposed right before rash started  Had dye from black shoe also come out on her foot   Bad itching Has tried soaking in hot water and various OTC creams  Bothered by thick right great toenail Unable to cut it herself  Still notes memory loss Not necessarily worse than last visit in January Ran out of one of her meds this week, but doesn't know which one  Current Outpatient Prescriptions on File Prior to Visit  Medication Sig Dispense Refill  . DIOVAN 80 MG tablet take 1 tablet by mouth once daily  --NEEDS APPOINTMENT  30 tablet  1    Allergies  Allergen Reactions  . Ace Inhibitors     REACTION: cough  . Alendronate Sodium     REACTION: pill got stuck    Past Medical History  Diagnosis Date  . Hypertension   . Hyperlipidemia   . Allergy   . Thrombocytopenia, unspecified   . GERD (gastroesophageal reflux disease)   . Arthritis   . Disorder of bone and cartilage, unspecified   . Hepatitis   . Cataract     Past Surgical History  Procedure Date  . Esophagogastroduodenoscopy   . Cardiac catheterization     Family History  Problem Relation Age of Onset  . Alzheimer's disease Mother     History   Social History  . Marital Status: Legally Separated    Spouse Name: N/A    Number of Children: 1  . Years of Education: N/A   Occupational History  . Not on file.   Social History Main Topics  . Smoking status: Never Smoker   . Smokeless tobacco: Never Used  . Alcohol Use: No  . Drug Use: No  . Sexually Active:    Other Topics Concern  . Not on file   Social History Narrative   does word puzzles to keep memory sharp enjoys readingis active but no longer drives    Review of Systems Sleep is not great---esp with itching No  problems with depression    Objective:   Physical Exam  Constitutional: She appears well-developed and well-nourished. No distress.  Cardiovascular:       Feet are warm  Neurological:       Knows November 15?,  Dr Royden Purl office Vague on all history details  Skin:       Multiple red papules that are not blanchable--mostly right calf Confluent rash on inner right ankle and distal dorsal right foot Slight rash on left calf  Psychiatric: She has a normal mood and affect. Her behavior is normal.          Assessment & Plan:

## 2011-11-04 NOTE — Assessment & Plan Note (Signed)
Likely diagnosis on right foot No generalized rash----does have petechial look but I think that is from the scratching Will treat with prednisone Check CBC now to be sure platelets are okay

## 2011-11-04 NOTE — Assessment & Plan Note (Signed)
BP Readings from Last 3 Encounters:  11/04/11 190/80  12/29/10 156/72  07/01/10 146/70   She ran out of BP med (I think) We will refill and have it rechecked at follow up

## 2011-11-04 NOTE — Assessment & Plan Note (Signed)
Right great toenail trimmed some

## 2011-11-04 NOTE — Patient Instructions (Signed)
Please stop the simvastatin Continue the other meds Recheck with Dr Milinda Antis in 1 month---please bring a friend or family member with you

## 2011-11-29 ENCOUNTER — Other Ambulatory Visit: Payer: Self-pay | Admitting: Family Medicine

## 2011-12-04 ENCOUNTER — Ambulatory Visit (INDEPENDENT_AMBULATORY_CARE_PROVIDER_SITE_OTHER): Payer: PRIVATE HEALTH INSURANCE | Admitting: Family Medicine

## 2011-12-04 ENCOUNTER — Encounter: Payer: Self-pay | Admitting: Family Medicine

## 2011-12-04 VITALS — BP 140/80 | HR 74 | Temp 98.5°F | Wt 145.0 lb

## 2011-12-04 DIAGNOSIS — L259 Unspecified contact dermatitis, unspecified cause: Secondary | ICD-10-CM

## 2011-12-04 DIAGNOSIS — R413 Other amnesia: Secondary | ICD-10-CM

## 2011-12-04 DIAGNOSIS — E559 Vitamin D deficiency, unspecified: Secondary | ICD-10-CM

## 2011-12-04 DIAGNOSIS — I10 Essential (primary) hypertension: Secondary | ICD-10-CM

## 2011-12-04 DIAGNOSIS — E785 Hyperlipidemia, unspecified: Secondary | ICD-10-CM

## 2011-12-04 LAB — COMPREHENSIVE METABOLIC PANEL
AST: 21 U/L (ref 0–37)
Albumin: 4 g/dL (ref 3.5–5.2)
Alkaline Phosphatase: 71 U/L (ref 39–117)
BUN: 14 mg/dL (ref 6–23)
Creat: 0.87 mg/dL (ref 0.50–1.10)
Potassium: 4.2 mEq/L (ref 3.5–5.3)
Total Bilirubin: 0.3 mg/dL (ref 0.3–1.2)

## 2011-12-04 LAB — LIPID PANEL
Cholesterol: 172 mg/dL (ref 0–200)
HDL: 64 mg/dL (ref >39)
LDL Cholesterol: 64 mg/dL (ref 0–99)
Triglycerides: 221 mg/dL — ABNORMAL HIGH (ref <150)
VLDL: 44 mg/dL — ABNORMAL HIGH (ref 0–40)

## 2011-12-04 LAB — SEDIMENTATION RATE: Sed Rate: 17 mm/hr (ref 0–22)

## 2011-12-04 MED ORDER — MOMETASONE FUROATE 0.1 % EX CREA: TOPICAL_CREAM | Freq: Every day | CUTANEOUS | Status: AC

## 2011-12-04 MED ORDER — MOMETASONE FUROATE 50 MCG/ACT NA SUSP: 2.0000 | NASAL | Status: DC | PRN

## 2011-12-04 NOTE — Progress Notes (Signed)
Subjective:    Patient ID: Amanda Salas, female    DOB: 09-02-1927, 77 y.o.   MRN: [id number]  HPI Here for f/u of memory loss/ HTN / dermatitis and other problems   Was tx for contact derm with prednisone Now her arms look red at night and wakes herself up scratching  Does not think it is anxiety  Foot is better  grandaughter had some thing like it from school --- ? Was scabies  Thinks that is what she had - is really unsure   No itching between fingers    Noted memory problems and likely not taking medicines  She notes memory is poor  Worse if she worry/ anxious  Living by herself  Does keep a pill box   Mother had alzheimers - and others in her family    bp is   140/80  Today-- overall better  No cp or palpitations or headaches or edema  No side effects to medicines    Wt is stable  Lab Results  Component Value Date   CHOL 176 12/29/2010   CHOL 173 07/01/2010   CHOL 169 12/11/2009   Lab Results  Component Value Date   HDL 61.80 12/29/2010   HDL 66.30 07/01/2010   HDL 91.47 12/11/2009   Lab Results  Component Value Date   LDLCALC 81 12/29/2010   LDLCALC 57 12/11/2009   LDLCALC 83 08/10/2008   Lab Results  Component Value Date   TRIG 168.0* 12/29/2010   TRIG 228.0* 07/01/2010   TRIG 199.0* 12/11/2009   Lab Results  Component Value Date   CHOLHDL 3 12/29/2010   CHOLHDL 3 07/01/2010   CHOLHDL 2 12/11/2009   Lab Results  Component Value Date   LDLDIRECT 81.0 07/01/2010    About due to check  Took her off chol med -- forgot to do that   Patient Active Problem List  Diagnoses  . UNSPECIFIED VITAMIN D DEFICIENCY  . HYPERLIPIDEMIA  . THROMBOCYTOPENIA, CHRONIC  . ESSENTIAL HYPERTENSION  . ALLERGIC RHINITIS  . GERD  . OSTEOPOROSIS  . FALL, HX OF  . ORAL APHTHAE  . MEMORY LOSS  . Contact dermatitis  . Mycotic toenails   Past Medical History  Diagnosis Date  . Hypertension   . Hyperlipidemia   . Allergy   . Thrombocytopenia, unspecified   . GERD  (gastroesophageal reflux disease)   . Arthritis   . Disorder of bone and cartilage, unspecified   . Hepatitis   . Cataract    Past Surgical History  Procedure Date  . Esophagogastroduodenoscopy   . Cardiac catheterization    History  Substance Use Topics  . Smoking status: Never Smoker   . Smokeless tobacco: Never Used  . Alcohol Use: No   Family History  Problem Relation Age of Onset  . Alzheimer's disease Mother    Allergies  Allergen Reactions  . Ace Inhibitors     REACTION: cough  . Alendronate Sodium     REACTION: pill got stuck   Current Outpatient Prescriptions on File Prior to Visit  Medication Sig Dispense Refill  . ibandronate (BONIVA) 150 MG tablet take 1 tablet by mouth MONTHLY  1 tablet  11  . metoprolol (TOPROL-XL) 50 MG 24 hr tablet Take 1 tablet (50 mg total) by mouth daily.  30 tablet  1  . valsartan (DIOVAN) 80 MG tablet Take 0.5 tablets (40 mg total) by mouth daily.  30 tablet  1  Review of Systems Review of Systems  Constitutional: Negative for fever, appetite change, fatigue and unexpected weight change.  Eyes: Negative for pain and visual disturbance.  Respiratory: Negative for cough and shortness of breath.   Cardiovascular: Negative for cp or palpitations    Gastrointestinal: Negative for nausea, diarrhea and constipation.  Genitourinary: Negative for urgency and frequency.  Skin: Negative for pallor or rash   Neurological: Negative for weakness, light-headedness, numbness and headaches. pos for short term memory loss and also low threshold to get confused  Hematological: Negative for adenopathy. Does not bruise/bleed easily.  Psychiatric/Behavioral: Negative for dysphoric mood. Pos for significant anxiety         Objective:   Physical Exam  Constitutional: She appears well-developed and well-nourished. No distress.       Elderly and well appearing  Gets confused easily  HENT:  Head: Normocephalic and atraumatic.    Mouth/Throat: Oropharynx is clear and moist.  Eyes: Conjunctivae and EOM are normal. Pupils are equal, round, and reactive to light. No scleral icterus.  Neck: Normal range of motion. Neck supple. No JVD present. Carotid bruit is not present. No thyromegaly present.  Cardiovascular: Normal rate, regular rhythm, normal heart sounds and intact distal pulses.  Exam reveals no gallop.   Pulmonary/Chest: Effort normal and breath sounds normal. No respiratory distress. She has no wheezes. She exhibits no tenderness.  Abdominal: Soft. Bowel sounds are normal. She exhibits no distension, no abdominal bruit and no mass. There is no tenderness.  Musculoskeletal: Normal range of motion. She exhibits no edema.  Lymphadenopathy:    She has no cervical adenopathy.  Neurological: She is alert. She has normal reflexes. She displays no tremor. No cranial nerve deficit. She exhibits normal muscle tone. Coordination normal.  Skin: Skin is warm and dry. No rash noted. No erythema. No pallor.  Psychiatric: She has a normal mood and affect.       Gets confused easily when talking about her medicines  Pleasant and talkative  Tangential/ changes subjects often and interrupts (? Due to hearing loss or innatention)          Assessment & Plan:

## 2011-12-04 NOTE — Assessment & Plan Note (Signed)
Discussed eliminating scented products alltogether and keeping cool  Had imp for a while on prednisone Given px for elocon cream to try  Will disc further at f/u ? Anxiety playing a role

## 2011-12-04 NOTE — Assessment & Plan Note (Signed)
Disc this in length Both easy confusion and short term memory loss I think she is confused about her meds  Will f/u 2-3 wk with her daughter to discuss it in detail and do MMS  Labs for memory today Expressed concern to keep her safe

## 2011-12-04 NOTE — Assessment & Plan Note (Signed)
D level today for bone health and general well being

## 2011-12-04 NOTE — Patient Instructions (Addendum)
Start using the elocon cream on rash as needed once daily  Keep cool- you will itch more if warm Get zyrtec 10 mg over the counter and take one daily for itching Labs today for memory loss and other problems Follow up in 2-3 weeks with a family member or friend -- for 30 minute visit -- to discuss memory problems -this is very important Stop the simvastatin/ zocor (your cholesterol medicine)

## 2011-12-04 NOTE — Assessment & Plan Note (Signed)
Due for labs On statin though told to stop it due to memory problem (she forgot to stop it ) Reiterated this  Disc at f/u

## 2011-12-04 NOTE — Assessment & Plan Note (Signed)
Much better now that she is taking her medicine Will continue to monitor Lab today

## 2011-12-05 LAB — CBC WITH DIFFERENTIAL/PLATELET
Basophils Absolute: 0 10*3/uL (ref 0.0–0.1)
Basophils Relative: 0 % (ref 0–1)
Eosinophils Absolute: 0.3 10*3/uL (ref 0.0–0.7)
HCT: 38.2 % (ref 36.0–46.0)
MCH: 29.3 pg (ref 26.0–34.0)
MCHC: 33.2 g/dL (ref 30.0–36.0)
Monocytes Absolute: 0.9 10*3/uL (ref 0.1–1.0)
Neutro Abs: 3.5 10*3/uL (ref 1.7–7.7)
RDW: 14.5 % (ref 11.5–15.5)

## 2011-12-06 ENCOUNTER — Telehealth: Payer: Self-pay | Admitting: Family Medicine

## 2011-12-06 DIAGNOSIS — E559 Vitamin D deficiency, unspecified: Secondary | ICD-10-CM

## 2011-12-06 MED ORDER — ERGOCALCIFEROL 1.25 MG (50000 UT) PO CAPS: 50000.0000 [IU] | ORAL_CAPSULE | ORAL | Status: AC

## 2011-12-06 NOTE — Telephone Encounter (Signed)
Labs are ok but vitamin D is low  Px written for call in  For weekly D for 12 weeks Will disc further at f/u

## 2011-12-07 NOTE — Telephone Encounter (Signed)
Patient notified as instructed by telephone. Medication phoned to [facility] pharmacy as instructed.Pt already has appt 12/25/11 at 3:15pm

## 2011-12-17 ENCOUNTER — Other Ambulatory Visit: Payer: Self-pay | Admitting: Internal Medicine

## 2011-12-25 ENCOUNTER — Ambulatory Visit: Payer: PRIVATE HEALTH INSURANCE | Admitting: Family Medicine

## 2012-02-01 ENCOUNTER — Other Ambulatory Visit: Payer: Self-pay | Admitting: Internal Medicine

## 2012-02-01 ENCOUNTER — Other Ambulatory Visit: Payer: Self-pay | Admitting: *Deleted

## 2012-02-01 NOTE — Telephone Encounter (Signed)
Patient called requesting a refill on her Diovan. Is it okay to refill medication?

## 2012-02-01 NOTE — Telephone Encounter (Signed)
I already did it

## 2012-02-01 NOTE — Telephone Encounter (Signed)
Will refill electronically  

## 2012-02-02 NOTE — Telephone Encounter (Signed)
No answer and no v/m at pts only contact #. Will try again.

## 2012-02-02 NOTE — Telephone Encounter (Signed)
Spoke with pt and she has been taking Diovan 80 mg one tablet daily since she started med. Instructions on bottle are 1/2 tab daily but pt never read instructions. Pt said today her vision is blurred and slight swimmy headed and head "feels funny"(pt can not explain any more than that) Pt said it might be allergies.Please advise about Diovan.

## 2012-02-02 NOTE — Telephone Encounter (Signed)
Have her go down to 1/2 pill - call in if needed  At last visit in dec we disc memory problems and she was supposed to f/u with a family member in 2-3 weeks - if not done already sched that appt

## 2012-02-19 ENCOUNTER — Telehealth: Payer: Self-pay | Admitting: Family Medicine

## 2012-02-19 MED ORDER — VALSARTAN 80 MG PO TABS: 80.0000 mg | ORAL_TABLET | Freq: Every day | ORAL | Status: DC

## 2012-02-19 NOTE — Telephone Encounter (Signed)
I think she should be taking 80 mg once daily - please call that in if needed

## 2012-02-19 NOTE — Telephone Encounter (Signed)
Patient's daughter Whitney Muse would like to have the dosage of her mother's DIOVAN clarified.  She said she has two different amounts that should be taken.

## 2012-02-19 NOTE — Telephone Encounter (Signed)
Spoke with pts daughter and pt has been taking Diovan 80 mg one tablet by mouth daily. Medication phoned to [facility] pharmacy as instructed.

## 2012-02-28 ENCOUNTER — Other Ambulatory Visit: Payer: Self-pay | Admitting: Internal Medicine

## 2012-07-19 ENCOUNTER — Other Ambulatory Visit: Payer: Self-pay | Admitting: *Deleted

## 2012-07-19 MED ORDER — METOPROLOL SUCCINATE ER 50 MG PO TB24: ORAL_TABLET | ORAL | Status: DC

## 2012-07-21 ENCOUNTER — Other Ambulatory Visit: Payer: Self-pay

## 2012-07-28 ENCOUNTER — Other Ambulatory Visit: Payer: Self-pay | Admitting: *Deleted

## 2012-07-28 MED ORDER — METOPROLOL SUCCINATE ER 50 MG PO TB24: ORAL_TABLET | ORAL | Status: DC

## 2012-08-15 ENCOUNTER — Other Ambulatory Visit: Payer: Self-pay | Admitting: *Deleted

## 2012-08-15 MED ORDER — METOPROLOL SUCCINATE ER 50 MG PO TB24: ORAL_TABLET | ORAL | Status: DC

## 2012-11-11 ENCOUNTER — Telehealth: Payer: Self-pay | Admitting: *Deleted

## 2012-11-11 NOTE — Telephone Encounter (Signed)
Faxed refill request from [place] [facility] for simvastatin 20 mg's, last filled 10/12/12.  This is no longer on patient's med list.  Please advise on refill.

## 2012-11-13 NOTE — Telephone Encounter (Signed)
Please talk to pt and a family member She was going to stop it with ? Of whether it affected her memory  Unsure if she stayed on it or not  If she is on it -please refil for 6 mo, thanks

## 2012-11-14 MED ORDER — SIMVASTATIN 20 MG PO TABS: 20.0000 mg | ORAL_TABLET | Freq: Every day | ORAL | Status: DC

## 2012-11-14 MED ORDER — IBANDRONATE SODIUM 150 MG PO TABS: 150.0000 mg | ORAL_TABLET | ORAL | Status: DC

## 2012-11-14 NOTE — Telephone Encounter (Signed)
Pt said she never stop taking the simvastatin, she stopped for 1 day and she said she felt bad off of the med so she restarted it the next day, Rx refill sent to [facility]

## 2012-12-08 ENCOUNTER — Other Ambulatory Visit: Payer: Self-pay | Admitting: *Deleted

## 2012-12-08 MED ORDER — METOPROLOL SUCCINATE ER 50 MG PO TB24: ORAL_TABLET | ORAL | Status: DC

## 2013-01-09 ENCOUNTER — Other Ambulatory Visit: Payer: Self-pay | Admitting: *Deleted

## 2013-02-01 ENCOUNTER — Other Ambulatory Visit: Payer: Self-pay | Admitting: *Deleted

## 2013-02-01 MED ORDER — METOPROLOL SUCCINATE ER 50 MG PO TB24: ORAL_TABLET | ORAL | Status: DC

## 2013-02-01 MED ORDER — VALSARTAN 80 MG PO TABS: 80.0000 mg | ORAL_TABLET | Freq: Every day | ORAL | Status: DC

## 2013-02-28 ENCOUNTER — Other Ambulatory Visit: Payer: Self-pay | Admitting: *Deleted

## 2013-02-28 MED ORDER — VALSARTAN 80 MG PO TABS: 80.0000 mg | ORAL_TABLET | Freq: Every day | ORAL | Status: DC

## 2013-02-28 MED ORDER — METOPROLOL SUCCINATE ER 50 MG PO TB24: ORAL_TABLET | ORAL | Status: DC

## 2013-02-28 NOTE — Addendum Note (Signed)
Addended by: Baldomero Lamy on: 02/28/2013 09:18 AM   Modules accepted: Orders

## 2013-03-03 ENCOUNTER — Encounter: Payer: Self-pay | Admitting: Family Medicine

## 2013-03-03 ENCOUNTER — Ambulatory Visit (INDEPENDENT_AMBULATORY_CARE_PROVIDER_SITE_OTHER): Payer: Medicare PPO | Admitting: Family Medicine

## 2013-03-03 ENCOUNTER — Ambulatory Visit: Payer: PRIVATE HEALTH INSURANCE | Admitting: Family Medicine

## 2013-03-03 VITALS — BP 146/74 | HR 76 | Temp 98.3°F | Ht 60.5 in | Wt 142.8 lb

## 2013-03-03 DIAGNOSIS — E559 Vitamin D deficiency, unspecified: Secondary | ICD-10-CM

## 2013-03-03 DIAGNOSIS — E785 Hyperlipidemia, unspecified: Secondary | ICD-10-CM

## 2013-03-03 DIAGNOSIS — D696 Thrombocytopenia, unspecified: Secondary | ICD-10-CM

## 2013-03-03 DIAGNOSIS — R413 Other amnesia: Secondary | ICD-10-CM

## 2013-03-03 DIAGNOSIS — M810 Age-related osteoporosis without current pathological fracture: Secondary | ICD-10-CM

## 2013-03-03 DIAGNOSIS — I10 Essential (primary) hypertension: Secondary | ICD-10-CM

## 2013-03-03 DIAGNOSIS — Z23 Encounter for immunization: Secondary | ICD-10-CM

## 2013-03-03 LAB — CBC WITH DIFFERENTIAL/PLATELET
Basophils Absolute: 0 10*3/uL (ref 0.0–0.1)
Eosinophils Absolute: 0.4 10*3/uL (ref 0.0–0.7)
Eosinophils Relative: 5 % (ref 0–5)
HCT: 36.3 % (ref 36.0–46.0)
Lymphocytes Relative: 35 % (ref 12–46)
Lymphs Abs: 2.4 10*3/uL (ref 0.7–4.0)
MCH: 28.4 pg (ref 26.0–34.0)
MCV: 83.8 fL (ref 78.0–100.0)
Monocytes Absolute: 0.8 10*3/uL (ref 0.1–1.0)
Platelets: 157 10*3/uL (ref 150–400)
RDW: 14.5 % (ref 11.5–15.5)

## 2013-03-03 LAB — COMPREHENSIVE METABOLIC PANEL
ALT: 16 U/L (ref 0–35)
BUN: 18 mg/dL (ref 6–23)
CO2: 29 mEq/L (ref 19–32)
Calcium: 9.3 mg/dL (ref 8.4–10.5)
Chloride: 105 mEq/L (ref 96–112)
Creat: 1.27 mg/dL — ABNORMAL HIGH (ref 0.50–1.10)
Total Bilirubin: 0.4 mg/dL (ref 0.3–1.2)

## 2013-03-03 LAB — LIPID PANEL
Cholesterol: 159 mg/dL (ref 0–200)
HDL: 58 mg/dL (ref >39)
Total CHOL/HDL Ratio: 2.7 Ratio
Triglycerides: 192 mg/dL — ABNORMAL HIGH (ref <150)

## 2013-03-03 LAB — TSH: TSH: 2.2 u[IU]/mL (ref 0.350–4.500)

## 2013-03-03 NOTE — Patient Instructions (Addendum)
Flu shot today if we have one  If you are interested in a shingles/zoster vaccine - call your insurance to check on coverage,( you should not get it within 1 month of other vaccines) , then call us for a prescription  for it to take to a pharmacy that gives the shot , or make a nurse visit to get it here depending on your coverage Get some vitamin D over the counter 1000 iu daily  No change in medicines Labs today

## 2013-03-03 NOTE — Progress Notes (Signed)
Subjective:    Patient ID: Amanda Salas, female    DOB: October 08, 1927, 79 y.o.   MRN: [id number]  HPI Here for f/u of chronic conditions Is doing very well   Wt is down 3 lb with bmi of 27 She really wanted to loose weight - but pretty much eats what she wants for the most part  Is not as active as she gets older    Memory- is gradually getting worse , but not too bothersome to her  Mood is very good - no depression   Was falling in the past -but not recently  Tends to climb - has stopped this   bp is stable today  No cp or palpitations or headaches or edema  No side effects to medicines  BP Readings from Last 3 Encounters:  03/03/13 146/74  12/04/11 140/80  11/04/11 190/80     Hyperlipidemia  On zocor-no problems with that  Eats what she wants  Due for labs   OP On boniva Has D Def Due for dexa - but she declines  Has a walker in the attic she needs to get down  Not taking her vitamin D  She does get up in night to go to the bathroom   She wants a flu shot if we have one   Declines a mammogram   Patient Active Problem List  Diagnosis  . UNSPECIFIED VITAMIN D DEFICIENCY  . HYPERLIPIDEMIA  . THROMBOCYTOPENIA, CHRONIC  . ESSENTIAL HYPERTENSION  . ALLERGIC RHINITIS  . GERD  . OSTEOPOROSIS  . FALL, HX OF  . ORAL APHTHAE  . MEMORY LOSS  . Contact dermatitis  . Mycotic toenails   Past Medical History  Diagnosis Date  . Hypertension   . Hyperlipidemia   . Allergy   . Thrombocytopenia, unspecified   . GERD (gastroesophageal reflux disease)   . Arthritis   . Disorder of bone and cartilage, unspecified   . Hepatitis   . Cataract    Past Surgical History  Procedure Laterality Date  . Esophagogastroduodenoscopy    . Cardiac catheterization     History  Substance Use Topics  . Smoking status: Never Smoker   . Smokeless tobacco: Never Used  . Alcohol Use: No   Family History  Problem Relation Age of Onset  . Alzheimer's disease Mother     Allergies  Allergen Reactions  . Ace Inhibitors     REACTION: cough  . Alendronate Sodium     REACTION: pill got stuck   Current Outpatient Prescriptions on File Prior to Visit  Medication Sig Dispense Refill  . ibandronate (BONIVA) 150 MG tablet Take 1 tablet (150 mg total) by mouth every 30 (thirty) days. Take in the morning with a full glass of water on empty stomach & do not take anything else by mouth or lie down for the next 30 min  1 tablet  5  . metoprolol succinate (TOPROL-XL) 50 MG 24 hr tablet Take one by mouth daily.  30 tablet  0  . mometasone (NASONEX) 50 MCG/ACT nasal spray Place 2 sprays into the nose as needed.  17 g  3  . simvastatin (ZOCOR) 20 MG tablet Take 1 tablet (20 mg total) by mouth at bedtime.  90 tablet  1  . valsartan (DIOVAN) 80 MG tablet Take 1 tablet (80 mg total) by mouth daily.  30 tablet  0   No current facility-administered medications on file prior to visit.        Review  of Systems Review of Systems  Constitutional: Negative for fever, appetite change, fatigue and unexpected weight change.  Eyes: Negative for pain and visual disturbance.  Respiratory: Negative for cough and shortness of breath.   Cardiovascular: Negative for cp or palpitations    Gastrointestinal: Negative for nausea, diarrhea and constipation.  Genitourinary: Negative for urgency and frequency.  Skin: Negative for pallor or rash   Neurological: Negative for weakness, light-headedness, numbness and headaches.  Hematological: Negative for adenopathy. Does not bruise/bleed easily.  Psychiatric/Behavioral: Negative for dysphoric mood. The patient is not nervous/anxious.  pos for poor short term memory       Objective:   Physical Exam  Constitutional: She appears well-developed and well-nourished. No distress.  Frail appearing elderly female  HENT:  Head: Normocephalic and atraumatic.  Right Ear: External ear normal.  Left Ear: External ear normal.  Mouth/Throat:  Oropharynx is clear and moist.  Eyes: Conjunctivae and EOM are normal. Pupils are equal, round, and reactive to light.  Neck: Normal range of motion. Neck supple. No JVD present. Carotid bruit is not present. No thyromegaly present.  Cardiovascular: Normal rate, regular rhythm and intact distal pulses.  Exam reveals no gallop.   Pulmonary/Chest: Effort normal and breath sounds normal. No respiratory distress. She has no wheezes.  Abdominal: Soft. Bowel sounds are normal. She exhibits no distension, no abdominal bruit and no mass. There is no tenderness.  Musculoskeletal: She exhibits no edema and no tenderness.  Lymphadenopathy:    She has no cervical adenopathy.  Neurological: She is alert. She has normal reflexes. No cranial nerve deficit. She exhibits normal muscle tone. Coordination normal.  Skin: Skin is warm and dry. No rash noted. No erythema. No pallor.  Psychiatric: She has a normal mood and affect.          Assessment & Plan:

## 2013-03-05 MED ORDER — METOPROLOL SUCCINATE ER 50 MG PO TB24: ORAL_TABLET | ORAL | Status: DC

## 2013-03-05 MED ORDER — VALSARTAN 80 MG PO TABS: 80.0000 mg | ORAL_TABLET | Freq: Every day | ORAL | Status: DC

## 2013-03-05 MED ORDER — MOMETASONE FUROATE 50 MCG/ACT NA SUSP: 2.0000 | NASAL | Status: DC | PRN

## 2013-03-05 MED ORDER — SIMVASTATIN 20 MG PO TABS: 20.0000 mg | ORAL_TABLET | Freq: Every day | ORAL | Status: DC

## 2013-03-05 NOTE — Assessment & Plan Note (Signed)
Slowly worsening - declines med at this time, gets help with medicines from family and doing fairly well

## 2013-03-05 NOTE — Assessment & Plan Note (Signed)
Lab today  No bruising or bleeding  

## 2013-03-05 NOTE — Addendum Note (Signed)
Addended by: Roxy Manns A on: 03/05/2013 07:33 PM   Modules accepted: Orders

## 2013-03-05 NOTE — Assessment & Plan Note (Signed)
bp in fair control at this time  No changes needed  Disc lifstyle change with low sodium diet and exercise  Lab today 

## 2013-03-05 NOTE — Assessment & Plan Note (Signed)
Level today-not taking D currently

## 2013-03-05 NOTE — Assessment & Plan Note (Signed)
Pt declines further dexa at this time Rev ca and D and she needs to start taking D and check level Disc fall risk and I do recommend using walker at all times now

## 2013-03-06 ENCOUNTER — Telehealth: Payer: Self-pay

## 2013-03-06 MED ORDER — ZOSTER VACCINE LIVE 19400 UNT/0.65ML ~~LOC~~ SOLR: 0.6500 mL | Freq: Once | SUBCUTANEOUS | Status: DC

## 2013-03-06 NOTE — Telephone Encounter (Signed)
Pt notified Rx sent to pharm

## 2013-03-06 NOTE — Telephone Encounter (Signed)
I sent it to [place] 

## 2013-03-06 NOTE — Telephone Encounter (Signed)
Pt request shingles vaccine rx sent to [facility] pharmacy. Pt request call back when done.

## 2013-03-29 ENCOUNTER — Telehealth: Payer: Self-pay

## 2013-03-29 NOTE — Telephone Encounter (Signed)
Pts daughter Kurtis Bushman request name of med that was discontinued. Left v/m for Jacki Cones to call back.

## 2013-03-29 NOTE — Telephone Encounter (Signed)
Just hold the boniva- in fact please take it off her med list to eliminate confusion-thanks

## 2013-03-29 NOTE — Telephone Encounter (Signed)
Jacki Cones, pts daughter wanted to verify which med pt is to hold; advised according to lab results pt to hold Boniva. Jacki Cones wants to know if that is the only med she needs to hold and when does Dr Milinda Antis think she will restart the Boniva.Please advise.

## 2013-03-29 NOTE — Telephone Encounter (Signed)
Pt's daughter notified and Boniva taken off of med list

## 2013-04-26 ENCOUNTER — Other Ambulatory Visit: Payer: Self-pay | Admitting: *Deleted

## 2013-04-26 MED ORDER — SIMVASTATIN 20 MG PO TABS: 20.0000 mg | ORAL_TABLET | Freq: Every day | ORAL | Status: DC

## 2013-05-02 ENCOUNTER — Other Ambulatory Visit: Payer: Medicare PPO

## 2013-05-03 ENCOUNTER — Other Ambulatory Visit (INDEPENDENT_AMBULATORY_CARE_PROVIDER_SITE_OTHER): Payer: Medicare PPO

## 2013-05-03 DIAGNOSIS — N289 Disorder of kidney and ureter, unspecified: Secondary | ICD-10-CM

## 2013-05-05 ENCOUNTER — Encounter: Payer: Self-pay | Admitting: *Deleted

## 2013-10-17 ENCOUNTER — Telehealth: Payer: Self-pay

## 2013-10-17 NOTE — Telephone Encounter (Signed)
Amanda Salas pts daughter left v/m; pt having back pain for 2 weeks with occas on and off lower abd pain; pt said she cannot come to office. Amanda Salas wants to bring urine specimen to office.pt has had no known injury. No burning or pain when urinates and no fever; no confusion.pt has noticed some frequency of urine.Please advise. [facility]

## 2013-10-17 NOTE — Telephone Encounter (Signed)
Spoke with pt's daughter and financial issues are not the problem, pt said she is in so much pain that she doesn't want to even get up and get dressed to come to an appt, I advise daughter that Dr. Milinda Antis really thinks she needs to be seen, daughter said she is going to go over to her house tomorrow and try to help her get up and dressed and when she is over there she will call to schedule an appt tomorrow, declined to schedule appt now until speaks with mother

## 2013-10-17 NOTE — Telephone Encounter (Signed)
If the pt can't come in due to copay we can let her set up a payment plan or waive the copay like Dr. Milinda Antis said.  Please see if finances are the reason she cannot come in.

## 2013-10-17 NOTE — Telephone Encounter (Signed)
I feel much more comfortable seeing her in the office - if this is a $ issue can a copay be waived or a payment plan established - we are not supposed to do ua's without recent visit ... Can you run this by Hansel Starling also ?

## 2013-10-18 NOTE — Telephone Encounter (Signed)
I understand-please keep me posted

## 2013-11-22 ENCOUNTER — Ambulatory Visit (INDEPENDENT_AMBULATORY_CARE_PROVIDER_SITE_OTHER): Payer: Medicare PPO | Admitting: Family Medicine

## 2013-11-22 ENCOUNTER — Ambulatory Visit (INDEPENDENT_AMBULATORY_CARE_PROVIDER_SITE_OTHER)
Admission: RE | Admit: 2013-11-22 | Discharge: 2013-11-22 | Disposition: A | Discharge status: Home / Self Care | Payer: Medicare PPO | Source: Ambulatory Visit | Attending: Family Medicine | Admitting: Family Medicine

## 2013-11-22 ENCOUNTER — Encounter: Payer: Self-pay | Admitting: Family Medicine

## 2013-11-22 VITALS — BP 138/78 | HR 71 | Temp 97.8°F | Ht 58.5 in | Wt 121.8 lb

## 2013-11-22 DIAGNOSIS — M545 Low back pain: Secondary | ICD-10-CM

## 2013-11-22 DIAGNOSIS — Z23 Encounter for immunization: Secondary | ICD-10-CM

## 2013-11-22 LAB — POCT URINALYSIS DIPSTICK
Bilirubin, UA: NEGATIVE
Blood, UA: NEGATIVE
Glucose, UA: NEGATIVE
Spec Grav, UA: <=1.005

## 2013-11-22 NOTE — Patient Instructions (Signed)
Tylenol regular strength - you can take 2 pills up to every 4 hours -no more than that  You can try some mild heat on back - for 10 minutes at a time  Walking is the best thing to do  I would feel better if you used your walker- due to both the back pain and also balance problems Back xray today  We will make a plan when the report returns- we will call you  Follow up for annual exam with labs prior in April please

## 2013-11-22 NOTE — Progress Notes (Signed)
Pre-visit discussion using our clinic review tool. No additional management support is needed unless otherwise documented below in the visit note.  

## 2013-11-22 NOTE — Progress Notes (Signed)
Subjective:    Patient ID: Amanda Salas, female    DOB: 1927-12-12, 79 y.o.   MRN: [id number]  HPI Here for low back pain  Has complaining for about 8 weeks   Back pain - is an ache in lower back - both sides  Some pain does go down to her legs  She stayed in bed for a while -- now is up and around and a bit better   No numbness or weakness  Using arthritis strength tylenol - every 8 hours or the short acting  No new bowel or bladder changes or incontinence   Not using heat or ice   Has not had flu shot yet- needs one    Not getting up and moving like she was with age  Also appetitie is less and she does not eat as much  Is happy that she lost wt Has cut back on ice cream and cookies and sweets   Having some trouble with meds- family is beginning to organize and admin those She does confuse easily   Patient Active Problem List   Diagnosis Date Noted  . Low back pain 11/22/2013  . Contact dermatitis 11/04/2011  . 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 UNSPECIFIED VITAMIN D DEFICIENCY 07/01/2010  . FALL, HX OF 07/01/2010  . HYPERLIPIDEMIA 08/10/2008  . THROMBOCYTOPENIA, CHRONIC 08/10/2008  . ESSENTIAL HYPERTENSION 08/10/2008  . ALLERGIC RHINITIS 08/10/2008  . GERD 08/10/2008  . OSTEOPOROSIS 08/10/2008   Past Medical History  Diagnosis Date  . Hypertension   . Hyperlipidemia   . Allergy   . Thrombocytopenia, unspecified   . GERD (gastroesophageal reflux disease)   . Arthritis   . Disorder of bone and cartilage, unspecified   . Hepatitis   . Cataract    Past Surgical History  Procedure Laterality Date  . Esophagogastroduodenoscopy    . Cardiac catheterization     History  Substance Use Topics  . Smoking status: Never Smoker   . Smokeless tobacco: Never Used  . Alcohol Use: No   Family History  Problem Relation Age of Onset  . Alzheimer's disease Mother    Allergies  Allergen Reactions  . Ace Inhibitors     REACTION: cough    . Alendronate Sodium     REACTION: pill got stuck   Current Outpatient Prescriptions on File Prior to Visit  Medication Sig Dispense Refill  . cholecalciferol (VITAMIN D) 1000 UNITS tablet Take 1,000 Units by mouth daily.      . metoprolol succinate (TOPROL-XL) 50 MG 24 hr tablet Take one by mouth daily.  30 tablet  11  . simvastatin (ZOCOR) 20 MG tablet Take 1 tablet (20 mg total) by mouth at bedtime.  90 tablet  3  . valsartan (DIOVAN) 80 MG tablet Take 1 tablet (80 mg total) by mouth daily.  30 tablet  11  . zoster vaccine live, PF, (ZOSTAVAX) 14782 UNT/0.65ML injection Inject 19,400 Units into the skin once.  1 vial  0   No current facility-administered medications on file prior to visit.    Review of Systems Review of Systems  Constitutional: Negative for fever, appetite change, fatigue and unexpected weight change.  Eyes: Negative for pain and visual disturbance.  Respiratory: Negative for cough and shortness of breath.   Cardiovascular: Negative for cp or palpitations    Gastrointestinal: Negative for nausea, diarrhea and constipation.  Genitourinary: Negative for urgency and frequency.  Skin: Negative for pallor or rash  MSK pos for back pain  Neurological: Negative for weakness, light-headedness, numbness and headaches.  Hematological: Negative for adenopathy. Does not bruise/bleed easily.  Psychiatric/Behavioral: Negative for dysphoric mood. The patient is nervous/anxious.  pos for short term memory loss        Objective:   Physical Exam  Constitutional: She appears well-developed and well-nourished. No distress.  Frail appearing elderly female  HENT:  Head: Normocephalic and atraumatic.  Mouth/Throat: Oropharynx is clear and moist.  Eyes: Conjunctivae and EOM are normal. Pupils are equal, round, and reactive to light. Right eye exhibits no discharge. Left eye exhibits no discharge. No scleral icterus.  Neck: Normal range of motion. Neck supple. No JVD present.  Carotid bruit is not present. No thyromegaly present.  Cardiovascular: Normal rate.   Pulmonary/Chest: Effort normal and breath sounds normal.  Abdominal: Soft. Bowel sounds are normal.  No suprapubic tenderness or fullness    Musculoskeletal: She exhibits tenderness. She exhibits no edema.       Lumbar back: She exhibits tenderness, bony tenderness and spasm. She exhibits no edema and no deformity.  Tender over L3-4 and also in para lumbar musculature bilaterally  Pos SLR on the L for some leg pain  Gait is labored No foot drop  Nl rom knees/ hips   No acute neuro changes  Lymphadenopathy:    She has no cervical adenopathy.  Neurological: She is alert. She has normal reflexes. No cranial nerve deficit. She exhibits normal muscle tone. Coordination normal.  Skin: Skin is warm and dry. No rash noted. No erythema. No pallor.  Psychiatric: Her mood appears anxious. Her affect is not blunt and not labile.          Assessment & Plan:

## 2013-11-23 NOTE — Assessment & Plan Note (Signed)
Suspect some deg disc/ joint pain or OA at her age - poss some radiculopathy but no acute neuro symptoms Films today Long disc re: safe use of tylenol and app dosing  Adv heat for 10 min intervals and walking with a walker

## 2013-11-26 ENCOUNTER — Telehealth: Payer: Self-pay | Admitting: Family Medicine

## 2013-11-26 MED ORDER — CALCITONIN (SALMON) 200 UNIT/ACT NA SOLN: 1.0000 | Freq: Every day | NASAL | Status: DC

## 2013-11-26 NOTE — Telephone Encounter (Signed)
I will send that to [facility] pharmacy  Have her f/u as planned and then we wil disc other therapies as well

## 2013-11-26 NOTE — Telephone Encounter (Signed)
Message copied by Judy Pimple on Sun Nov 26, 2013  4:43 PM ------      Message from: Shon Millet      Created: Fri Nov 24, 2013  5:06 PM       Pt's daughter notified of Dr. Royden Purl comments and f/u appt scheduled for 12/26/13, daughter does want her to try the nasal spray and another med to help with OP, pt uses [facility], please advise ------

## 2013-11-27 NOTE — Telephone Encounter (Signed)
Pt advised and states an understanding 

## 2013-12-26 ENCOUNTER — Encounter: Payer: Self-pay | Admitting: Family Medicine

## 2013-12-26 ENCOUNTER — Ambulatory Visit (INDEPENDENT_AMBULATORY_CARE_PROVIDER_SITE_OTHER): Payer: Medicare PPO | Admitting: Family Medicine

## 2013-12-26 ENCOUNTER — Ambulatory Visit: Payer: Medicare PPO | Admitting: Family Medicine

## 2013-12-26 VITALS — BP 132/76 | HR 54 | Temp 98.2°F | Ht 58.5 in | Wt 124.2 lb

## 2013-12-26 DIAGNOSIS — M810 Age-related osteoporosis without current pathological fracture: Secondary | ICD-10-CM

## 2013-12-26 DIAGNOSIS — S22009A Unspecified fracture of unspecified thoracic vertebra, initial encounter for closed fracture: Secondary | ICD-10-CM

## 2013-12-26 DIAGNOSIS — S22080A Wedge compression fracture of T11-T12 vertebra, initial encounter for closed fracture: Secondary | ICD-10-CM

## 2013-12-26 DIAGNOSIS — Z8781 Personal history of (healed) traumatic fracture: Secondary | ICD-10-CM | POA: Insufficient documentation

## 2013-12-26 DIAGNOSIS — S32010A Wedge compression fracture of first lumbar vertebra, initial encounter for closed fracture: Principal | ICD-10-CM

## 2013-12-26 MED ORDER — RALOXIFENE HCL 60 MG PO TABS: 60.0000 mg | ORAL_TABLET | Freq: Every day | ORAL | Status: DC

## 2013-12-26 NOTE — Patient Instructions (Signed)
Please start using a walker to prevent falls and help you get up when needed  Get at least 600 mg of calcium twice daily  Get at least 2000 iu of vitamin D daily  Continue the miacalcin nasal spray  Start evista one pill daily- if any side effects or problems let me know  Schedule annual exam for late April with labs prior (we will probably order a bone density test at that visit)    Osteoporosis Throughout your life, your body breaks down old bone and replaces it with new bone. As you get older, your body does not replace bone as quickly as it breaks it down. By the age of 26 years, most people begin to gradually lose bone because of the imbalance between bone loss and replacement. Some people lose more bone than others. Bone loss beyond a specified normal degree is considered osteoporosis.  Osteoporosis affects the strength and durability of your bones. The inside of the ends of your bones and your flat bones, like the bones of your pelvis, look like honeycomb, filled with tiny open spaces. As bone loss occurs, your bones become less dense. This means that the open spaces inside your bones become bigger and the walls between these spaces become thinner. This makes your bones weaker. Bones of a person with osteoporosis can become so weak that they can break (fracture) during minor accidents, such as a simple fall. CAUSES  The following factors have been associated with the development of osteoporosis:  Smoking.  Drinking more than 2 alcoholic drinks several days per week.  Long-term use of certain medicines:  Corticosteroids.  Chemotherapy medicines.  Thyroid medicines.  Antiepileptic medicines.  Gonadal hormone suppression medicine.  Immunosuppression medicine.  Being underweight.  Lack of physical activity.  Lack of exposure to the sun. This can lead to vitamin D deficiency.  Certain medical conditions:  Certain inflammatory bowel diseases, such as Crohn disease and  ulcerative colitis.  Diabetes.  Hyperthyroidism.  Hyperparathyroidism. RISK FACTORS Anyone can develop osteoporosis. However, the following factors can increase your risk of developing osteoporosis:  Gender Women are at higher risk than men.  Age Being older than 33 years increases your risk.  Ethnicity White and Asian people have an increased risk.  Weight Being extremely underweight can increase your risk of osteoporosis.  Family history of osteoporosis Having a family member who has developed osteoporosis can increase your risk. SYMPTOMS  Usually, people with osteoporosis have no symptoms.  DIAGNOSIS  Signs during a physical exam that may prompt your caregiver to suspect osteoporosis include:  Decreased height. This is usually caused by the compression of the bones that form your spine (vertebrae) because they have weakened and become fractured.  A curving or rounding of the upper back (kyphosis). To confirm signs of osteoporosis, your caregiver may request a procedure that uses 2 low-dose X-ray beams with different levels of energy to measure your bone mineral density (dual-energy X-ray absorptiometry [DXA]). Also, your caregiver may check your level of vitamin D. TREATMENT  The goal of osteoporosis treatment is to strengthen bones in order to decrease the risk of bone fractures. There are different types of medicines available to help achieve this goal. Some of these medicines work by slowing the processes of bone loss. Some medicines work by increasing bone density. Treatment also involves making sure that your levels of calcium and vitamin D are adequate. PREVENTION  There are things you can do to help prevent osteoporosis. Adequate intake of calcium and  vitamin D can help you achieve optimal bone mineral density. Regular exercise can also help, especially resistance and weight-bearing activities. If you smoke, quitting smoking is an important part of osteoporosis  prevention. MAKE SURE YOU:  Understand these instructions.  Will watch your condition.  Will get help right away if you are not doing well or get worse. FOR MORE INFORMATION www.osteo.org and [employer].ee Document Released: 09/16/2005 Document Revised: 04/03/2013 Document Reviewed: 11/21/2011 [facility] Patient Information 2014 [place], [place].

## 2013-12-26 NOTE — Progress Notes (Signed)
Subjective:    Patient ID: Amanda Salas, female    DOB: 07/14/1927, 80 y.o.   MRN: [id number]  HPI Here for f/u of back pain and OP  Spinal xrays showed partial comp fx - worst at T12-L2  Now on miacalcin ns She takes 2 regular strength tylenol several times per day  She feels most comfortable in a straight backed chair   Has hx of OP- had declined further dexa at last discussion Intol of fosamax in the past   Last D level was low at 21   She plans on starting to use a walker to prevent falls   Patient Active Problem List   Diagnosis Date Noted  . Compression fracture of thoracolumbar vertebra 12/26/2013  . Low back pain 11/22/2013  . Contact dermatitis 11/04/2011  . 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 UNSPECIFIED VITAMIN D DEFICIENCY 07/01/2010  . FALL, HX OF 07/01/2010  . HYPERLIPIDEMIA 08/10/2008  . THROMBOCYTOPENIA, CHRONIC 08/10/2008  . ESSENTIAL HYPERTENSION 08/10/2008  . ALLERGIC RHINITIS 08/10/2008  . GERD 08/10/2008  . OSTEOPOROSIS 08/10/2008   Past Medical History  Diagnosis Date  . Hypertension   . Hyperlipidemia   . Allergy   . Thrombocytopenia, unspecified   . GERD (gastroesophageal reflux disease)   . Arthritis   . Disorder of bone and cartilage, unspecified   . Hepatitis   . Cataract    Past Surgical History  Procedure Laterality Date  . Esophagogastroduodenoscopy    . Cardiac catheterization     History  Substance Use Topics  . Smoking status: Never Smoker   . Smokeless tobacco: Never Used  . Alcohol Use: No   Family History  Problem Relation Age of Onset  . Alzheimer's disease Mother    Allergies  Allergen Reactions  . Ace Inhibitors     REACTION: cough  . Alendronate Sodium     REACTION: pill got stuck   Current Outpatient Prescriptions on File Prior to Visit  Medication Sig Dispense Refill  . calcitonin, salmon, (MIACALCIN/FORTICAL) 200 UNIT/ACT nasal spray Place 1 spray into alternate nostrils  daily.  3.7 mL  12  . cholecalciferol (VITAMIN D) 1000 UNITS tablet Take 1,000 Units by mouth daily.      . metoprolol succinate (TOPROL-XL) 50 MG 24 hr tablet Take one by mouth daily.  30 tablet  11  . simvastatin (ZOCOR) 20 MG tablet Take 1 tablet (20 mg total) by mouth at bedtime.  90 tablet  3  . valsartan (DIOVAN) 80 MG tablet Take 1 tablet (80 mg total) by mouth daily.  30 tablet  11  . zoster vaccine live, PF, (ZOSTAVAX) 41962 UNT/0.65ML injection Inject 19,400 Units into the skin once.  1 vial  0   No current facility-administered medications on file prior to visit.    Review of Systems Review of Systems  Constitutional: Negative for fever, appetite change, fatigue and unexpected weight change.  Eyes: Negative for pain and visual disturbance.  Respiratory: Negative for cough and shortness of breath.   Cardiovascular: Negative for cp or palpitations    Gastrointestinal: Negative for nausea, diarrhea and constipation.  Genitourinary: Negative for urgency and frequency.  Skin: Negative for pallor or rash   MSK pos for mid to low back pain-worse after standing Neurological: Negative for weakness, light-headedness, numbness and headaches.  Hematological: Negative for adenopathy. Does not bruise/bleed easily.  Psychiatric/Behavioral: Negative for dysphoric mood. The patient is not nervous/anxious.  Objective:   Physical Exam  Constitutional: She appears well-developed and well-nourished. No distress.  Elderly frail app female  HENT:  Head: Normocephalic and atraumatic.  Eyes: Conjunctivae and EOM are normal. Pupils are equal, round, and reactive to light.  Neck: Normal range of motion. Neck supple. No thyromegaly present.  Cardiovascular: Normal rate and regular rhythm.   Pulmonary/Chest: Effort normal and breath sounds normal.  Musculoskeletal: She exhibits tenderness.  Low TS and upper LS tenderness with limited rom - improved from last time Mild kyphosis     Lymphadenopathy:    She has no cervical adenopathy.  Neurological: She is alert. She has normal reflexes.  Skin: Skin is warm and dry. No rash noted.  Psychiatric: She has a normal mood and affect.          Assessment & Plan:

## 2013-12-26 NOTE — Progress Notes (Signed)
Pre-visit discussion using our clinic review tool. No additional management support is needed unless otherwise documented below in the visit note.  

## 2013-12-27 NOTE — Assessment & Plan Note (Signed)
Long disc re: OP  Will continue miacalcin ns  Add evista  Tylenol prn pain  Will update if no further imp

## 2013-12-27 NOTE — Assessment & Plan Note (Signed)
Pt declines dexa lately  Intol of fosamax  Disc need for calcium/ vitamin D/ wt bearing exercise and bone density test every 2 y to monitor Disc safety/ fracture risk in detail   Continue miacalcin Add evista Add D 1000 iu  dexa in spring when feeling up to it  Disc pros / cons of different tx to prev fx

## 2014-01-03 ENCOUNTER — Telehealth: Payer: Self-pay | Admitting: *Deleted

## 2014-01-03 NOTE — Telephone Encounter (Signed)
Done and in IN box 

## 2014-01-03 NOTE — Telephone Encounter (Signed)
Received prior auth request. Auth paperwork obtained and placed in your inbox.   

## 2014-01-04 NOTE — Telephone Encounter (Signed)
Amanda Salas pts daughter left v/m to ck on status of prior auth; Amanda Salas request med to be approved thru ins or change medication. Amanda Salas did not leave contact #.

## 2014-01-05 NOTE — Telephone Encounter (Signed)
I have not received a response from [employer] of an approval or denial. I resubmitted paperwork via fax.

## 2014-03-09 ENCOUNTER — Other Ambulatory Visit: Payer: Self-pay | Admitting: Family Medicine

## 2014-03-23 ENCOUNTER — Other Ambulatory Visit: Payer: Self-pay | Admitting: Family Medicine

## 2014-04-09 ENCOUNTER — Telehealth: Payer: Self-pay | Admitting: Family Medicine

## 2014-04-09 DIAGNOSIS — I10 Essential (primary) hypertension: Secondary | ICD-10-CM

## 2014-04-09 DIAGNOSIS — M810 Age-related osteoporosis without current pathological fracture: Secondary | ICD-10-CM

## 2014-04-09 DIAGNOSIS — E785 Hyperlipidemia, unspecified: Secondary | ICD-10-CM

## 2014-04-09 DIAGNOSIS — D696 Thrombocytopenia, unspecified: Secondary | ICD-10-CM

## 2014-04-09 DIAGNOSIS — E559 Vitamin D deficiency, unspecified: Secondary | ICD-10-CM

## 2014-04-09 NOTE — Telephone Encounter (Signed)
Message copied by Abner Greenspan on Mon Apr 09, 2014  9:57 PM ------      Message from: Ellamae Sia      Created: Wed Apr 04, 2014  3:47 PM      Regarding: Lab orders for Tuesday, 4.21.15       Patient is scheduled for CPX labs, please order future labs, Thanks , Terri       ------

## 2014-04-10 ENCOUNTER — Other Ambulatory Visit: Payer: Medicare PPO

## 2014-04-12 ENCOUNTER — Other Ambulatory Visit: Payer: Medicare PPO

## 2014-04-12 ENCOUNTER — Other Ambulatory Visit (INDEPENDENT_AMBULATORY_CARE_PROVIDER_SITE_OTHER): Payer: Medicare HMO

## 2014-04-12 DIAGNOSIS — D696 Thrombocytopenia, unspecified: Secondary | ICD-10-CM | POA: Diagnosis not present

## 2014-04-12 DIAGNOSIS — E785 Hyperlipidemia, unspecified: Secondary | ICD-10-CM

## 2014-04-12 DIAGNOSIS — E559 Vitamin D deficiency, unspecified: Secondary | ICD-10-CM

## 2014-04-12 DIAGNOSIS — M810 Age-related osteoporosis without current pathological fracture: Secondary | ICD-10-CM

## 2014-04-12 DIAGNOSIS — I10 Essential (primary) hypertension: Secondary | ICD-10-CM | POA: Diagnosis not present

## 2014-04-12 LAB — CBC WITH DIFFERENTIAL/PLATELET
BASOS ABS: 0 10*3/uL (ref 0.0–0.1)
Basophils Relative: 0.6 % (ref 0.0–3.0)
EOS PCT: 1.7 % (ref 0.0–5.0)
Eosinophils Absolute: 0.1 10*3/uL (ref 0.0–0.7)
HCT: 34.5 % — ABNORMAL LOW (ref 36.0–46.0)
HEMOGLOBIN: 11.3 g/dL — AB (ref 12.0–15.0)
LYMPHS PCT: 28.7 % (ref 12.0–46.0)
Lymphs Abs: 2.2 10*3/uL (ref 0.7–4.0)
MCHC: 32.7 g/dL (ref 30.0–36.0)
MCV: 88.5 fl (ref 78.0–100.0)
MONOS PCT: 8.2 % (ref 3.0–12.0)
Monocytes Absolute: 0.6 10*3/uL (ref 0.1–1.0)
NEUTROS ABS: 4.7 10*3/uL (ref 1.4–7.7)
Neutrophils Relative %: 60.8 % (ref 43.0–77.0)
Platelets: 128 10*3/uL — ABNORMAL LOW (ref 150.0–400.0)
RBC: 3.89 Mil/uL (ref 3.87–5.11)
RDW: 13.9 % (ref 11.5–14.6)
WBC: 7.7 10*3/uL (ref 4.5–10.5)

## 2014-04-12 LAB — COMPREHENSIVE METABOLIC PANEL
ALT: 14 U/L (ref 0–35)
AST: 23 U/L (ref 0–37)
Albumin: 3.9 g/dL (ref 3.5–5.2)
Alkaline Phosphatase: 44 U/L (ref 39–117)
BUN: 15 mg/dL (ref 6–23)
CALCIUM: 9 mg/dL (ref 8.4–10.5)
CHLORIDE: 103 meq/L (ref 96–112)
CO2: 23 meq/L (ref 19–32)
CREATININE: 1 mg/dL (ref 0.4–1.2)
GFR: 54.21 mL/min — ABNORMAL LOW (ref >60.00)
Glucose, Bld: 94 mg/dL (ref 70–99)
Potassium: 4.4 mEq/L (ref 3.5–5.1)
Sodium: 135 mEq/L (ref 135–145)
Total Bilirubin: 0.5 mg/dL (ref 0.3–1.2)
Total Protein: 7 g/dL (ref 6.0–8.3)

## 2014-04-12 LAB — LIPID PANEL
Cholesterol: 124 mg/dL (ref 0–200)
HDL: 73.9 mg/dL (ref >39.00)
LDL Cholesterol: 31 mg/dL (ref 0–99)
Total CHOL/HDL Ratio: 2
Triglycerides: 96 mg/dL (ref 0.0–149.0)
VLDL: 19.2 mg/dL (ref 0.0–40.0)

## 2014-04-12 LAB — TSH: TSH: 1.19 u[IU]/mL (ref 0.35–5.50)

## 2014-04-13 LAB — VITAMIN D 25 HYDROXY (VIT D DEFICIENCY, FRACTURES): VIT D 25 HYDROXY: 31 ng/mL (ref 30–89)

## 2014-04-17 ENCOUNTER — Other Ambulatory Visit: Payer: Self-pay | Admitting: Family Medicine

## 2014-04-17 ENCOUNTER — Encounter: Payer: Self-pay | Admitting: Family Medicine

## 2014-04-17 ENCOUNTER — Ambulatory Visit (INDEPENDENT_AMBULATORY_CARE_PROVIDER_SITE_OTHER): Payer: Commercial Managed Care - HMO | Admitting: Family Medicine

## 2014-04-17 VITALS — BP 110/72 | HR 70 | Temp 98.1°F | Ht 58.75 in | Wt 125.5 lb

## 2014-04-17 DIAGNOSIS — D696 Thrombocytopenia, unspecified: Secondary | ICD-10-CM

## 2014-04-17 DIAGNOSIS — Z0000 Encounter for general adult medical examination without abnormal findings: Secondary | ICD-10-CM | POA: Insufficient documentation

## 2014-04-17 DIAGNOSIS — D649 Anemia, unspecified: Secondary | ICD-10-CM | POA: Insufficient documentation

## 2014-04-17 DIAGNOSIS — Z1211 Encounter for screening for malignant neoplasm of colon: Secondary | ICD-10-CM | POA: Insufficient documentation

## 2014-04-17 DIAGNOSIS — E785 Hyperlipidemia, unspecified: Secondary | ICD-10-CM

## 2014-04-17 DIAGNOSIS — M810 Age-related osteoporosis without current pathological fracture: Secondary | ICD-10-CM

## 2014-04-17 DIAGNOSIS — I10 Essential (primary) hypertension: Secondary | ICD-10-CM

## 2014-04-17 DIAGNOSIS — E559 Vitamin D deficiency, unspecified: Secondary | ICD-10-CM

## 2014-04-17 MED ORDER — SIMVASTATIN 20 MG PO TABS: 20.0000 mg | ORAL_TABLET | Freq: Every day | ORAL | Status: DC

## 2014-04-17 MED ORDER — ZOSTER VACCINE LIVE 19400 UNT/0.65ML ~~LOC~~ SOLR: 0.6500 mL | Freq: Once | SUBCUTANEOUS | Status: DC

## 2014-04-17 NOTE — Progress Notes (Signed)
Pre visit review using our clinic review tool, if applicable. No additional management support is needed unless otherwise documented below in the visit note. 

## 2014-04-17 NOTE — Patient Instructions (Addendum)
Take the shingles vaccine px to [facility] and get it if you have not had it  Make a nurse appt for 4-6 weeks for a prevnar vaccine  Also make non fasting lab appt in 4-6 weeks to re check anemia  Eat a healthy balanced diet  Work on a living will/ advance directive  Please do the IFOB stool card  If you are interested in trying aricept for memory- let us know

## 2014-04-17 NOTE — Progress Notes (Signed)
Subjective:    Patient ID: Amanda Salas, female    DOB: March 30, 1927, 80 y.o.   MRN: [id number]  HPI I have personally reviewed the Medicare Annual Wellness questionnaire and have noted 1. The patient's medical and social history 2. Their use of alcohol, tobacco or illicit drugs 3. Their current medications and supplements 4. The patient's functional ability including ADL's, fall risks, home safety risks and hearing or visual             impairment. 5. Diet and physical activities 6. Evidence for depression or mood disorders  The patients weight, height, BMI have been recorded in the chart and visual acuity is per eye clinic.  I have made referrals, counseling and provided education to the patient based review of the above and I have provided the pt with a written personalized care plan for preventive services.  She was anxious about coming to the doctor today  If her back did not hurt all the time - she would feel good  Tylenol helps - she watches her dose Also on miacalcin for her comp fx   See scanned forms.  Routine anticipatory guidance given to patient.  See health maintenance. Colon cancer screening-has been a while since colonosc -was clear , stopped due to age  Breast cancer screening- has not had a mammogram in a while , at her age she wants to stop  Self breast exam- no lumps or changes  Flu vaccine 12/14  Tetanus vaccine 12/10  Pneumovax 12/06  Zoster vaccine- never filled the px for shingles vaccine - needs a px for that   Advance directive - does not have - she wants to work on that with her daughter- and was given packet Cognitive function addressed- see scanned forms- and if abnormal then additional documentation follows. -- keeping an eye on her memory (we have discussed that)- short term memory declines with age but declines med or treatment- according to daughter she forgets little things and then remembers quickly after  Suspect mild cognitive impairment    Actually thinks it is age related and to be expected  Family is not worried about safety   PMH and SH reviewed  Meds, vitals, and allergies reviewed.   ROS: See HPI.  Otherwise negative.    Wt is up 1 lb  bmi is 25 Overall stable after some wt loss a year ago   OP Last visit put off dexa - she declines again  Has had a comp fx  D level is 31- better  On miacalcin and evista currently  bp is stable today  No cp or palpitations or headaches or edema  No side effects to medicines  BP Readings from Last 3 Encounters:  04/17/14 110/72  12/26/13 132/76  11/22/13 138/78     Hyperlipidemia Lab Results  Component Value Date   CHOL 124 04/12/2014   CHOL 159 03/03/2013   CHOL 172 12/04/2011   Lab Results  Component Value Date   HDL 73.90 04/12/2014   HDL 58 03/03/2013   HDL 64 12/04/2011   Lab Results  Component Value Date   LDLCALC 31 04/12/2014   LDLCALC 63 03/03/2013   LDLCALC 64 12/04/2011   Lab Results  Component Value Date   TRIG 96.0 04/12/2014   TRIG 192* 03/03/2013   TRIG 221* 12/04/2011   Lab Results  Component Value Date   CHOLHDL 2 04/12/2014   CHOLHDL 2.7 03/03/2013   CHOLHDL 2.7 12/04/2011   Lab Results  Component Value Date   LDLDIRECT 81.0 07/01/2010   zocor and diet   TTP Now also a bit anemic Lab Results  Component Value Date   WBC 7.7 04/12/2014   HGB 11.3* 04/12/2014   HCT 34.5* 04/12/2014   MCV 88.5 04/12/2014   PLT 128.0* 04/12/2014     Patient Active Problem List   Diagnosis Date Noted  . Encounter for Medicare annual wellness exam 04/17/2014  . Colon cancer screening 04/17/2014  . Anemia 04/17/2014  . Compression fracture of thoracolumbar vertebra 12/26/2013  . Low back pain 11/22/2013  . Contact dermatitis 11/04/2011  . 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 UNSPECIFIED VITAMIN D DEFICIENCY 07/01/2010  . FALL, HX OF 07/01/2010  . HYPERLIPIDEMIA 08/10/2008  . THROMBOCYTOPENIA, CHRONIC 08/10/2008  . ESSENTIAL  HYPERTENSION 08/10/2008  . ALLERGIC RHINITIS 08/10/2008  . GERD 08/10/2008  . OSTEOPOROSIS 08/10/2008   Past Medical History  Diagnosis Date  . Hypertension   . Hyperlipidemia   . Allergy   . Thrombocytopenia, unspecified   . GERD (gastroesophageal reflux disease)   . Arthritis   . Disorder of bone and cartilage, unspecified   . Hepatitis   . Cataract    Past Surgical History  Procedure Laterality Date  . Esophagogastroduodenoscopy    . Cardiac catheterization     History  Substance Use Topics  . Smoking status: Never Smoker   . Smokeless tobacco: Never Used  . Alcohol Use: No   Family History  Problem Relation Age of Onset  . Alzheimer's disease Mother    Allergies  Allergen Reactions  . Ace Inhibitors     REACTION: cough  . Alendronate Sodium     REACTION: pill got stuck   Current Outpatient Prescriptions on File Prior to Visit  Medication Sig Dispense Refill  . calcitonin, salmon, (MIACALCIN/FORTICAL) 200 UNIT/ACT nasal spray Place 1 spray into alternate nostrils daily.  3.7 mL  12  . cholecalciferol (VITAMIN D) 1000 UNITS tablet Take 1,000 Units by mouth daily.      . metoprolol succinate (TOPROL-XL) 50 MG 24 hr tablet TAKE 1 TABLET BY MOUTH ONCE A DAY  30 tablet  0  . raloxifene (EVISTA) 60 MG tablet Take 1 tablet (60 mg total) by mouth daily.  30 tablet  11  . valsartan (DIOVAN) 80 MG tablet TAKE 1 TABLET BY MOUTH ONCE A DAY  30 tablet  1   No current facility-administered medications on file prior to visit.    Review of Systems    Review of Systems  Constitutional: Negative for fever, appetite change, fatigue and unexpected weight change.  Eyes: Negative for pain and visual disturbance.  Respiratory: Negative for cough and shortness of breath.   Cardiovascular: Negative for cp or palpitations    Gastrointestinal: Negative for nausea, diarrhea and constipation.  Genitourinary: Negative for urgency and frequency.  Skin: Negative for pallor or rash   MSK  pos for chronic back pain  Neurological: Negative for weakness, light-headedness, numbness and headaches.  Hematological: Negative for adenopathy. Does not bruise/bleed easily.  Psychiatric/Behavioral: Negative for dysphoric mood. The patient is nervous/anxious.      Objective:   Physical Exam  Constitutional: She appears well-developed and well-nourished. No distress.  HENT:  Head: Normocephalic and atraumatic.  Right Ear: External ear normal.  Left Ear: External ear normal.  Mouth/Throat: Oropharynx is clear and moist.  Eyes: Conjunctivae and EOM are normal. Pupils are equal, round, and reactive to light. No scleral icterus.  Neck:  Normal range of motion. Neck supple. No JVD present. Carotid bruit is not present. No thyromegaly present.  Cardiovascular: Normal rate, regular rhythm, normal heart sounds and intact distal pulses.  Exam reveals no gallop.   Pulmonary/Chest: Effort normal and breath sounds normal. No respiratory distress. She has no wheezes. She exhibits no tenderness.  Abdominal: Soft. Bowel sounds are normal. She exhibits no distension, no abdominal bruit and no mass. There is no tenderness.  Genitourinary: No breast swelling, tenderness, discharge or bleeding.  Musculoskeletal: Normal range of motion. She exhibits no edema and no tenderness.  Lymphadenopathy:    She has no cervical adenopathy.  Neurological: She is alert. She has normal reflexes. No cranial nerve deficit. She exhibits normal muscle tone. Coordination normal.  Skin: Skin is warm and dry. No rash noted. No erythema. No pallor.  Fair complexion w/o pallor\  Thickened toenails that are well trimmed   Psychiatric: Her mood appears anxious.  Pleasant and talkative            Assessment & Plan:

## 2014-04-18 NOTE — Assessment & Plan Note (Signed)
New/ mild/ may be diet or age rel  No symptoms  Re check in a mo with iron studies IfOb card was given-not interested in colonosc at her age

## 2014-04-18 NOTE — Assessment & Plan Note (Signed)
IFOB card given  

## 2014-04-18 NOTE — Assessment & Plan Note (Signed)
Disc goals for lipids and reasons to control them Rev labs with pt Rev low sat fat diet in detail Statin and diet  

## 2014-04-18 NOTE — Assessment & Plan Note (Signed)
bp in fair control at this time  BP Readings from Last 1 Encounters:  04/17/14 110/72   No changes needed Disc lifstyle change with low sodium diet and exercise  Lab rev

## 2014-04-18 NOTE — Assessment & Plan Note (Signed)
Reviewed health habits including diet and exercise and skin cancer prevention Reviewed appropriate screening tests for age  Also reviewed health mt list, fam hx and immunization status , as well as social and family history   Declines much health mt at her age Amanda Salas to work on living will

## 2014-04-18 NOTE — Assessment & Plan Note (Signed)
Vitamin D level is therapeutic with current supplementation Disc importance of this to bone and overall health  

## 2014-04-18 NOTE — Assessment & Plan Note (Signed)
Stable to imp platelets but slt anemia  IFOB given  Re check in a mo with iron studies Disc need for a balanced diet

## 2014-04-18 NOTE — Assessment & Plan Note (Signed)
Pt declines dexa  Has had spinal comp fx On miacalcin and evista and ca and D D level is tx Disc need for calcium/ vitamin D/ wt bearing exercise and bone density test every 2 y to monitor Disc safety/ fracture risk in detail

## 2014-04-23 ENCOUNTER — Other Ambulatory Visit: Payer: Self-pay | Admitting: Family Medicine

## 2014-05-23 ENCOUNTER — Other Ambulatory Visit: Payer: Self-pay | Admitting: Family Medicine

## 2014-05-30 ENCOUNTER — Ambulatory Visit: Payer: Commercial Managed Care - HMO

## 2014-05-30 ENCOUNTER — Other Ambulatory Visit: Payer: Commercial Managed Care - HMO

## 2014-06-01 ENCOUNTER — Ambulatory Visit (INDEPENDENT_AMBULATORY_CARE_PROVIDER_SITE_OTHER): Payer: Commercial Managed Care - HMO

## 2014-06-01 ENCOUNTER — Other Ambulatory Visit (INDEPENDENT_AMBULATORY_CARE_PROVIDER_SITE_OTHER): Payer: Commercial Managed Care - HMO

## 2014-06-01 DIAGNOSIS — D649 Anemia, unspecified: Secondary | ICD-10-CM

## 2014-06-01 DIAGNOSIS — Z23 Encounter for immunization: Secondary | ICD-10-CM

## 2014-06-01 LAB — CBC WITH DIFFERENTIAL/PLATELET
Basophils Absolute: 0 10*3/uL (ref 0.0–0.1)
Basophils Relative: 0.7 % (ref 0.0–3.0)
Eosinophils Absolute: 0.2 10*3/uL (ref 0.0–0.7)
Eosinophils Relative: 2.2 % (ref 0.0–5.0)
HCT: 36.5 % (ref 36.0–46.0)
Hemoglobin: 11.9 g/dL — ABNORMAL LOW (ref 12.0–15.0)
Lymphocytes Relative: 28.3 % (ref 12.0–46.0)
Lymphs Abs: 2 10*3/uL (ref 0.7–4.0)
MCHC: 32.7 g/dL (ref 30.0–36.0)
MCV: 89.4 fl (ref 78.0–100.0)
Monocytes Absolute: 0.7 10*3/uL (ref 0.1–1.0)
Monocytes Relative: 10.4 % (ref 3.0–12.0)
NEUTROS ABS: 4.1 10*3/uL (ref 1.4–7.7)
Neutrophils Relative %: 58.4 % (ref 43.0–77.0)
Platelets: 150 10*3/uL (ref 150.0–400.0)
RBC: 4.08 Mil/uL (ref 3.87–5.11)
RDW: 15.4 % (ref 11.5–15.5)
WBC: 7.1 10*3/uL (ref 4.0–10.5)

## 2014-06-01 LAB — FERRITIN: Ferritin: 71.9 ng/mL (ref 10.0–291.0)

## 2014-06-01 LAB — IRON: Iron: 65 ug/dL (ref 42–145)

## 2014-06-04 ENCOUNTER — Encounter: Payer: Self-pay | Admitting: Family Medicine

## 2014-10-22 ENCOUNTER — Other Ambulatory Visit: Payer: Self-pay | Admitting: Family Medicine

## 2014-10-22 ENCOUNTER — Other Ambulatory Visit (INDEPENDENT_AMBULATORY_CARE_PROVIDER_SITE_OTHER): Payer: Commercial Managed Care - HMO

## 2014-10-22 DIAGNOSIS — Z1211 Encounter for screening for malignant neoplasm of colon: Secondary | ICD-10-CM

## 2014-10-22 LAB — FECAL OCCULT BLOOD, GUAIAC: FECAL OCCULT BLD: NEGATIVE

## 2014-10-23 LAB — FECAL OCCULT BLOOD, IMMUNOCHEMICAL: Fecal Occult Bld: NEGATIVE

## 2014-10-24 ENCOUNTER — Encounter: Payer: Self-pay | Admitting: *Deleted

## 2014-10-30 ENCOUNTER — Ambulatory Visit: Payer: Commercial Managed Care - HMO

## 2014-10-30 ENCOUNTER — Ambulatory Visit (INDEPENDENT_AMBULATORY_CARE_PROVIDER_SITE_OTHER): Payer: Commercial Managed Care - HMO | Admitting: *Deleted

## 2014-10-30 DIAGNOSIS — Z23 Encounter for immunization: Secondary | ICD-10-CM

## 2014-11-20 ENCOUNTER — Other Ambulatory Visit: Payer: Self-pay | Admitting: Family Medicine

## 2014-12-31 ENCOUNTER — Telehealth: Payer: Self-pay | Admitting: Family Medicine

## 2014-12-31 NOTE — Telephone Encounter (Signed)
Electronic refill request, no recent/future appt., please advise  

## 2014-12-31 NOTE — Telephone Encounter (Signed)
Please schedule annual exam after 4/28 and refill until then

## 2015-01-01 NOTE — Telephone Encounter (Signed)
Shapale, Can you please go ahead and schedule this so you know how long exactly to refill the medication for.  Thank you

## 2015-01-02 NOTE — Telephone Encounter (Signed)
Left voicemail requesting pt to call office back 

## 2015-01-03 NOTE — Telephone Encounter (Signed)
Left 2nd voicemail requesting pt to call office and also mailed letter requesting pt to call us back

## 2015-01-08 NOTE — Telephone Encounter (Signed)
CPE scheduled and med refill

## 2015-01-21 ENCOUNTER — Other Ambulatory Visit: Payer: Self-pay | Admitting: Family Medicine

## 2015-02-19 ENCOUNTER — Other Ambulatory Visit: Payer: Self-pay | Admitting: Family Medicine

## 2015-04-23 ENCOUNTER — Encounter: Payer: Self-pay | Admitting: Family Medicine

## 2015-04-23 ENCOUNTER — Ambulatory Visit (INDEPENDENT_AMBULATORY_CARE_PROVIDER_SITE_OTHER): Payer: Commercial Managed Care - HMO | Admitting: Family Medicine

## 2015-04-23 VITALS — BP 130/70 | HR 69 | Temp 98.3°F | Ht 59.0 in | Wt 124.2 lb

## 2015-04-23 DIAGNOSIS — I10 Essential (primary) hypertension: Secondary | ICD-10-CM | POA: Diagnosis not present

## 2015-04-23 DIAGNOSIS — M810 Age-related osteoporosis without current pathological fracture: Secondary | ICD-10-CM

## 2015-04-23 DIAGNOSIS — Z0000 Encounter for general adult medical examination without abnormal findings: Secondary | ICD-10-CM | POA: Diagnosis not present

## 2015-04-23 DIAGNOSIS — E559 Vitamin D deficiency, unspecified: Secondary | ICD-10-CM

## 2015-04-23 DIAGNOSIS — E785 Hyperlipidemia, unspecified: Secondary | ICD-10-CM | POA: Diagnosis not present

## 2015-04-23 DIAGNOSIS — L603 Nail dystrophy: Secondary | ICD-10-CM | POA: Diagnosis not present

## 2015-04-23 NOTE — Assessment & Plan Note (Signed)
Check D level today  Disc imp to bone and overall health Has OP with spinal comp fx in the past

## 2015-04-23 NOTE — Assessment & Plan Note (Signed)
Hx of comp fx  Declines further dexa  On miacalcin and evista-no problems Disc need for calcium/ vitamin D/ wt bearing exercise and bone density test every 2 y to monitor Disc safety/ fracture risk in detail   No recent falls or fx

## 2015-04-23 NOTE — Progress Notes (Signed)
Subjective:    Patient ID: Amanda Salas, female    DOB: 01-29-1927, 81 y.o.   MRN: [id number]  HPI Here for annual medicare wellness visit as well as chronic/acute medical problems   I have personally reviewed the Medicare Annual Wellness questionnaire and have noted 1. The patient's medical and social history 2. Their use of alcohol, tobacco or illicit drugs 3. Their current medications and supplements 4. The patient's functional ability including ADL's, fall risks, home safety risks and hearing or visual             impairment. 5. Diet and physical activities 6. Evidence for depression or mood disorders  The patients weight, height, BMI have been recorded in the chart and visual acuity is per eye clinic.  I have made referrals, counseling and provided education to the patient based review of the above and I have provided the pt with a written personalized care plan for preventive services. Reviewed and updated provider list, see scanned forms.  Feeling good overall   See scanned forms.  Routine anticipatory guidance given to patient.  See health maintenance. Colon cancer screening IFOB nov 2015 -no symptoms  Breast cancer screening declines mammogram -does not want to look for cancer  Self breast exam-no lumps or changes  Flu vaccine 11/15  Tetanus vaccine 12/10 Pneumovax 6/15- had both 23 and the 13  Zoster vaccine - was sent to [facility]- unsure if she had it  Declines bone density test - has OP and is being treated  Advance directive she has a living will /POA - unsure if it was notarized (daugher is POA) Cognitive function addressed- see scanned forms- and if abnormal then additional documentation follows.  Has some short term memory changes (family does not think it has changed)  Does very little math but does word puzzles  No confusion /has not become lost in familiar places  Her family thinks her memory is great for her age  30 up on current events and stays  social   PMH and SH reviewed  Meds, vitals, and allergies reviewed.   ROS: See HPI.  Otherwise negative.    Wt is down 1 lb with bmi of 25 Appetite is ok   bp is up today on first check  No cp or palpitations or headaches or edema  No side effects to medicines  BP Readings from Last 3 Encounters:  04/23/15 140/64  04/17/14 110/72  12/26/13 132/76      OP- on evista and also miacalcin  Vit D def- due for labs    Hyperlipidemia = due for labs  zocor and diet   Has a dystrophic toenail on R great toe- was removed in the past   Patient Active Problem List   Diagnosis Date Noted  . Encounter for Medicare annual wellness exam 04/17/2014  . Colon cancer screening 04/17/2014  . Anemia 04/17/2014  . Compression fracture of thoracolumbar vertebra 12/26/2013  . Low back pain 11/22/2013  . Contact dermatitis 11/04/2011  . 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 UNSPECIFIED VITAMIN D DEFICIENCY 07/01/2010  . FALL, HX OF 07/01/2010  . HYPERLIPIDEMIA 08/10/2008  . THROMBOCYTOPENIA, CHRONIC 08/10/2008  . ESSENTIAL HYPERTENSION 08/10/2008  . ALLERGIC RHINITIS 08/10/2008  . GERD 08/10/2008  . OSTEOPOROSIS 08/10/2008   Past Medical History  Diagnosis Date  . Hypertension   . Hyperlipidemia   . Allergy   . Thrombocytopenia, unspecified   . GERD (gastroesophageal reflux disease)   . Arthritis   .  Disorder of bone and cartilage, unspecified   . Hepatitis   . Cataract    Past Surgical History  Procedure Laterality Date  . Esophagogastroduodenoscopy    . Cardiac catheterization     History  Substance Use Topics  . Smoking status: Never Smoker   . Smokeless tobacco: Never Used  . Alcohol Use: No   Family History  Problem Relation Age of Onset  . Alzheimer's disease Mother    Allergies  Allergen Reactions  . Ace Inhibitors     REACTION: cough  . Alendronate Sodium     REACTION: pill got stuck   Current Outpatient Prescriptions on File Prior to  Visit  Medication Sig Dispense Refill  . calcitonin, salmon, (MIACALCIN/FORTICAL) 200 UNIT/ACT nasal spray USE 1 SPRAY INTO ALTERNATE NOSTRILS DAILY 3.7 mL 4  . cholecalciferol (VITAMIN D) 1000 UNITS tablet Take 1,000 Units by mouth daily.    . metoprolol succinate (TOPROL-XL) 50 MG 24 hr tablet TAKE 1 TABLET BY MOUTH ONCE A DAY 30 tablet 2  . raloxifene (EVISTA) 60 MG tablet TAKE 1 TABLET BY MOUTH ONCE A DAY 30 tablet 2  . simvastatin (ZOCOR) 20 MG tablet Take 1 tablet (20 mg total) by mouth at bedtime. 90 tablet 3  . valsartan (DIOVAN) 80 MG tablet TAKE 1 TABLET BY MOUTH ONCE A DAY 30 tablet 2  . zoster vaccine live, PF, (ZOSTAVAX) 69485 UNT/0.65ML injection Inject 19,400 Units into the skin once. 1 vial 0   No current facility-administered medications on file prior to visit.     Review of Systems    Review of Systems  Constitutional: Negative for fever, appetite change, fatigue and unexpected weight change.  Eyes: Negative for pain and visual disturbance.  Respiratory: Negative for cough and shortness of breath.   Cardiovascular: Negative for cp or palpitations    Gastrointestinal: Negative for nausea, diarrhea and constipation.  Genitourinary: Negative for urgency and frequency.  Skin: Negative for pallor or rash   MSK pos for chronic low back pain  Neurological: Negative for weakness, light-headedness, numbness and headaches.  Hematological: Negative for adenopathy. Does not bruise/bleed easily.  Psychiatric/Behavioral: Negative for dysphoric mood. The patient is not nervous/anxious.      Objective:   Physical Exam  Constitutional: She appears well-developed and well-nourished. No distress.  Frail appearing elderly female   HENT:  Head: Normocephalic and atraumatic.  Right Ear: External ear normal.  Left Ear: External ear normal.  Nose: Nose normal.  Mouth/Throat: Oropharynx is clear and moist.  Eyes: Conjunctivae and EOM are normal. Pupils are equal, round, and reactive  to light. Right eye exhibits no discharge. Left eye exhibits no discharge. No scleral icterus.  Neck: Normal range of motion. Neck supple. No JVD present. Carotid bruit is not present. No thyromegaly present.  Cardiovascular: Normal rate, regular rhythm, normal heart sounds and intact distal pulses.  Exam reveals no gallop.   Pulmonary/Chest: Effort normal and breath sounds normal. No respiratory distress. She has no wheezes. She has no rales.  Abdominal: Soft. Bowel sounds are normal. She exhibits no distension and no mass. There is no tenderness.  Genitourinary:  Declines breast exam  Musculoskeletal: She exhibits no edema or tenderness.  Lymphadenopathy:    She has no cervical adenopathy.  Neurological: She is alert. She has normal reflexes. No cranial nerve deficit. She exhibits normal muscle tone. Coordination normal.  Skin: Skin is warm and dry. No rash noted. No erythema. No pallor.  Right great toenail is dystrophic and thick-  growing straight up   Psychiatric: She has a normal mood and affect.  Mentally sharp for her age 66 and talkative           Assessment & Plan:   Problem List Items Addressed This Visit      Cardiovascular and Mediastinum   Essential hypertension    bp in fair control at this time  BP Readings from Last 1 Encounters:  04/23/15 130/70   No changes needed Disc lifstyle change with low sodium diet and exercise  Labs today       Relevant Orders   CBC with Differential/Platelet   Comprehensive metabolic panel   TSH   Lipid panel     Musculoskeletal and Integument   Dystrophic nail - Primary    Large overgrown R great toenail (removed in the past) Needs to be filed down  She does not want to - but still recommended f/u with podiatrist       Osteoporosis    Hx of comp fx  Declines further dexa  On miacalcin and evista-no problems Disc need for calcium/ vitamin D/ wt bearing exercise and bone density test every 2 y to monitor Disc  safety/ fracture risk in detail   No recent falls or fx           Other   Encounter for Medicare annual wellness exam    Reviewed health habits including diet and exercise and skin cancer prevention Reviewed appropriate screening tests for age  Also reviewed health mt list, fam hx and immunization status , as well as social and family history   See HPI Labs drawn today  Will see if she has had the zoster vaccine  Daughter thinks she has advance directive        Hyperlipidemia    Labs today  zocor and diet       Relevant Orders   Lipid panel   Vitamin D deficiency    Check D level today  Disc imp to bone and overall health Has OP with spinal comp fx in the past       Relevant Orders   Vit D  25 hydroxy (rtn osteoporosis monitoring)

## 2015-04-23 NOTE — Assessment & Plan Note (Signed)
Large overgrown R great toenail (removed in the past) Needs to be filed down  She does not want to - but still recommended f/u with podiatrist

## 2015-04-23 NOTE — Assessment & Plan Note (Signed)
Labs today  zocor and diet

## 2015-04-23 NOTE — Progress Notes (Signed)
Pre visit review using our clinic review tool, if applicable. No additional management support is needed unless otherwise documented below in the visit note. 

## 2015-04-23 NOTE — Assessment & Plan Note (Signed)
Reviewed health habits including diet and exercise and skin cancer prevention Reviewed appropriate screening tests for age  Also reviewed health mt list, fam hx and immunization status , as well as social and family history   See HPI Labs drawn today  Will see if she has had the zoster vaccine  Daughter thinks she has advance directive

## 2015-04-23 NOTE — Assessment & Plan Note (Signed)
bp in fair control at this time  BP Readings from Last 1 Encounters:  04/23/15 130/70   No changes needed Disc lifstyle change with low sodium diet and exercise  Labs today

## 2015-04-23 NOTE — Patient Instructions (Addendum)
Labs today  Blood pressure is better on 2nd check  See your podiatrist about the toenail  Take care of yourself

## 2015-04-24 LAB — CBC WITH DIFFERENTIAL/PLATELET
BASOS ABS: 0.1 10*3/uL (ref 0.0–0.1)
Basophils Relative: 1.1 % (ref 0.0–3.0)
EOS PCT: 1.9 % (ref 0.0–5.0)
Eosinophils Absolute: 0.1 10*3/uL (ref 0.0–0.7)
HCT: 34.6 % — ABNORMAL LOW (ref 36.0–46.0)
Hemoglobin: 11.6 g/dL — ABNORMAL LOW (ref 12.0–15.0)
LYMPHS PCT: 28.8 % (ref 12.0–46.0)
Lymphs Abs: 2 10*3/uL (ref 0.7–4.0)
MCHC: 33.5 g/dL (ref 30.0–36.0)
MCV: 88.9 fl (ref 78.0–100.0)
Monocytes Absolute: 0.6 10*3/uL (ref 0.1–1.0)
Monocytes Relative: 8.5 % (ref 3.0–12.0)
NEUTROS PCT: 59.7 % (ref 43.0–77.0)
Neutro Abs: 4.2 10*3/uL (ref 1.4–7.7)
PLATELETS: 152 10*3/uL (ref 150.0–400.0)
RBC: 3.89 Mil/uL (ref 3.87–5.11)
RDW: 15.6 % — ABNORMAL HIGH (ref 11.5–15.5)
WBC: 7 10*3/uL (ref 4.0–10.5)

## 2015-04-24 LAB — COMPREHENSIVE METABOLIC PANEL
ALK PHOS: 51 U/L (ref 39–117)
ALT: 12 U/L (ref 0–35)
AST: 21 U/L (ref 0–37)
Albumin: 4 g/dL (ref 3.5–5.2)
BILIRUBIN TOTAL: 0.3 mg/dL (ref 0.2–1.2)
BUN: 16 mg/dL (ref 6–23)
CO2: 28 mEq/L (ref 19–32)
CREATININE: 0.99 mg/dL (ref 0.40–1.20)
Calcium: 9.6 mg/dL (ref 8.4–10.5)
Chloride: 104 mEq/L (ref 96–112)
GFR: 56.61 mL/min — ABNORMAL LOW (ref >60.00)
GLUCOSE: 89 mg/dL (ref 70–99)
POTASSIUM: 4.3 meq/L (ref 3.5–5.1)
Sodium: 136 mEq/L (ref 135–145)
Total Protein: 7.5 g/dL (ref 6.0–8.3)

## 2015-04-24 LAB — LIPID PANEL
CHOLESTEROL: 136 mg/dL (ref 0–200)
HDL: 64.8 mg/dL (ref >39.00)
LDL Cholesterol: 45 mg/dL (ref 0–99)
NonHDL: 71.2
Total CHOL/HDL Ratio: 2
Triglycerides: 129 mg/dL (ref 0.0–149.0)
VLDL: 25.8 mg/dL (ref 0.0–40.0)

## 2015-04-24 LAB — VITAMIN D 25 HYDROXY (VIT D DEFICIENCY, FRACTURES): VITD: 34.56 ng/mL (ref 30.00–100.00)

## 2015-04-24 LAB — TSH: TSH: 2.26 u[IU]/mL (ref 0.35–4.50)

## 2015-04-25 ENCOUNTER — Encounter: Payer: Self-pay | Admitting: *Deleted

## 2015-05-10 ENCOUNTER — Other Ambulatory Visit: Payer: Self-pay | Admitting: Family Medicine

## 2015-07-10 ENCOUNTER — Other Ambulatory Visit: Payer: Self-pay | Admitting: Family Medicine

## 2015-10-28 ENCOUNTER — Other Ambulatory Visit: Payer: Self-pay | Admitting: Family Medicine

## 2016-01-21 ENCOUNTER — Other Ambulatory Visit: Payer: Self-pay | Admitting: Family Medicine

## 2016-02-19 ENCOUNTER — Other Ambulatory Visit: Payer: Self-pay | Admitting: Family Medicine

## 2016-04-15 ENCOUNTER — Other Ambulatory Visit: Payer: Self-pay | Admitting: Family Medicine

## 2016-04-15 NOTE — Telephone Encounter (Signed)
Electronic refill request, no recent or future appts, last OV was 04/23/15, please advise

## 2016-04-15 NOTE — Telephone Encounter (Signed)
Please schedule summer 30 min office visit and refill until then thanks

## 2016-04-16 NOTE — Telephone Encounter (Signed)
appt scheduled and med refilled 

## 2016-05-27 ENCOUNTER — Ambulatory Visit (INDEPENDENT_AMBULATORY_CARE_PROVIDER_SITE_OTHER): Payer: Commercial Managed Care - HMO | Admitting: Family Medicine

## 2016-05-27 ENCOUNTER — Encounter: Payer: Self-pay | Admitting: Family Medicine

## 2016-05-27 VITALS — BP 134/82 | HR 68 | Temp 98.0°F | Ht 59.0 in | Wt 126.8 lb

## 2016-05-27 DIAGNOSIS — E785 Hyperlipidemia, unspecified: Secondary | ICD-10-CM

## 2016-05-27 DIAGNOSIS — I10 Essential (primary) hypertension: Secondary | ICD-10-CM

## 2016-05-27 DIAGNOSIS — E559 Vitamin D deficiency, unspecified: Secondary | ICD-10-CM | POA: Diagnosis not present

## 2016-05-27 DIAGNOSIS — R413 Other amnesia: Secondary | ICD-10-CM

## 2016-05-27 DIAGNOSIS — L989 Disorder of the skin and subcutaneous tissue, unspecified: Secondary | ICD-10-CM

## 2016-05-27 DIAGNOSIS — M810 Age-related osteoporosis without current pathological fracture: Secondary | ICD-10-CM

## 2016-05-27 MED ORDER — METOPROLOL SUCCINATE ER 50 MG PO TB24: 50.0000 mg | ORAL_TABLET | Freq: Every day | ORAL | Status: DC

## 2016-05-27 MED ORDER — CALCITONIN (SALMON) 200 UNIT/ACT NA SOLN: NASAL | Status: DC

## 2016-05-27 MED ORDER — VALSARTAN 80 MG PO TABS: 80.0000 mg | ORAL_TABLET | Freq: Every day | ORAL | Status: DC

## 2016-05-27 MED ORDER — RALOXIFENE HCL 60 MG PO TABS: 60.0000 mg | ORAL_TABLET | Freq: Every day | ORAL | Status: DC

## 2016-05-27 MED ORDER — SIMVASTATIN 20 MG PO TABS: 20.0000 mg | ORAL_TABLET | Freq: Every day | ORAL | Status: DC

## 2016-05-27 NOTE — Patient Instructions (Addendum)
Stop at check out for referral to dermatology  Call [facility] to see if you got a shingles shot  Labs today  Take care of yourself Stay social and active  We will schedule an appointment with Katha Cabal for a medicare question visit

## 2016-05-27 NOTE — Progress Notes (Signed)
Pre visit review using our clinic review tool, if applicable. No additional management support is needed unless otherwise documented below in the visit note. 

## 2016-05-27 NOTE — Progress Notes (Signed)
Subjective:    Patient ID: Amanda Salas, female    DOB: [birth year], 82 y.o.   MRN: [id number]  HPI  Here for f/u of chronic medical problems   Good days and bad days  More sleepy lately  She is a night owl and stays up late 1-3 am and gets up at 7  Goes back and lies down  Does nap during the day   Not a lot of exercise  Does go out and walk the dog and take out the trash Getting outdoors some  Socializes-talking on the phone --loves to talk Family keeps her busy- life is built around the little kids  babysits a lot   Wt is up 2 lb with bmi of 25.5  Eating well and appetite is good but does not like to cook  Eats smaller portions Does like sweets   Zoster vaccine -was sent to [place] in 2015 - unsure if she got it  Family will call to check on that    OP- and pt declines further dexa scans  On evista and miacalcin  Has hx of compression fx  No falls since  No fractures since  Hx of D deficiency   Uses a walker to prevent falls if she is not with anyone (family is not sure she uses it all the time)  bp is stable today  No cp or palpitations or headaches or edema  No side effects to medicines  BP Readings from Last 3 Encounters:  05/27/16 134/82  04/23/15 130/70  04/17/14 110/72     Memory -pt says it is "terrible"  Mentally sharp in general however  Short term memory-forgets names and then remembers them minutes later occ misplaces things  Forgot what time her appt was  No big change in the past year  Overall doing quite well   Cholesterol Lab Results  Component Value Date   CHOL 136 04/23/2015   HDL 64.80 04/23/2015   LDLCALC 45 04/23/2015   LDLDIRECT 81.0 07/01/2010   TRIG 129.0 04/23/2015   CHOLHDL 2 04/23/2015  on zocor and diet  Ate a BBQ sandwhich early   Due for labs today -(not fasting)   Low back still bothers her  It does not really slow her down for the most part     Patient Active Problem List   Diagnosis Date  Noted  . Skin lesion 05/27/2016  . Dystrophic nail 04/23/2015  . Encounter for Medicare annual wellness exam 04/17/2014  . Colon cancer screening 04/17/2014  . Anemia 04/17/2014  . Compression fracture of thoracolumbar vertebra ([place]) 12/26/2013  . Low back pain 11/22/2013  . Contact dermatitis 11/04/2011  . 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 Vitamin D deficiency 07/01/2010  . FALL, HX OF 07/01/2010  . Hyperlipidemia 08/10/2008  . THROMBOCYTOPENIA, CHRONIC 08/10/2008  . Essential hypertension 08/10/2008  . ALLERGIC RHINITIS 08/10/2008  . GERD 08/10/2008  . Osteoporosis 08/10/2008   Past Medical History  Diagnosis Date  . Hypertension   . Hyperlipidemia   . Allergy   . Thrombocytopenia, unspecified ([employer])   . GERD (gastroesophageal reflux disease)   . Arthritis   . Disorder of bone and cartilage, unspecified   . Hepatitis   . Cataract    Past Surgical History  Procedure Laterality Date  . Esophagogastroduodenoscopy    . Cardiac catheterization     Social History  Substance Use Topics  . Smoking status: Never Smoker   . Smokeless tobacco:  Never Used  . Alcohol Use: No   Family History  Problem Relation Age of Onset  . Alzheimer's disease Mother    Allergies  Allergen Reactions  . Ace Inhibitors     REACTION: cough  . Alendronate Sodium     REACTION: pill got stuck   Current Outpatient Prescriptions on File Prior to Visit  Medication Sig Dispense Refill  . cholecalciferol (VITAMIN D) 1000 UNITS tablet Take 1,000 Units by mouth daily.    Marland Kitchen zoster vaccine live, PF, (ZOSTAVAX) 10272 UNT/0.65ML injection Inject 19,400 Units into the skin once. 1 vial 0   No current facility-administered medications on file prior to visit.    Review of Systems Review of Systems  Constitutional: Negative for fever, appetite change, fatigue and unexpected weight change.  Eyes: Negative for pain and visual disturbance.  Respiratory: Negative for cough and  shortness of breath.   Cardiovascular: Negative for cp or palpitations    Gastrointestinal: Negative for nausea, diarrhea and constipation.  Genitourinary: Negative for urgency and frequency.  Skin: Negative for pallor or rash   MSK pos for chronic low back pain  Neurological: Negative for weakness, light-headedness, numbness and headaches. pos for short term memory loss  Hematological: Negative for adenopathy. Does not bruise/bleed easily.  Psychiatric/Behavioral: Negative for dysphoric mood. The patient is sometimes  nervous/anxious.         Objective:   Physical Exam  Constitutional: She appears well-developed and well-nourished. No distress.  Well appearing elderly female  HENT:  Head: Normocephalic and atraumatic.  Mouth/Throat: Oropharynx is clear and moist.  Hearing loss noted  Eyes: Conjunctivae and EOM are normal. Pupils are equal, round, and reactive to light.  Neck: Normal range of motion. Neck supple. No JVD present. Carotid bruit is not present. No thyromegaly present.  Cardiovascular: Normal rate, regular rhythm, normal heart sounds and intact distal pulses.  Exam reveals no gallop.   Pulmonary/Chest: Effort normal and breath sounds normal. No respiratory distress. She has no wheezes. She has no rales.  No crackles  Abdominal: Soft. Bowel sounds are normal. She exhibits no distension, no abdominal bruit and no mass. There is no tenderness.  Musculoskeletal: She exhibits no edema.  Poor rom LS  Gait is steady  Kyphosis noted   Lymphadenopathy:    She has no cervical adenopathy.  Neurological: She is alert. She has normal reflexes. No cranial nerve deficit. She exhibits normal muscle tone. Coordination normal.  Skin: Skin is warm and dry. No rash noted. No pallor.  Abraded lesion resembling SK (brown and raised) on L nasal bridge under glasses     Psychiatric: She has a normal mood and affect.  Cheerful and talkative Attentive           Assessment & Plan:     Problem List Items Addressed This Visit      Cardiovascular and Mediastinum   Essential hypertension - Primary    bp in fair control at this time  BP Readings from Last 1 Encounters:  05/27/16 134/82   No changes needed Disc lifstyle change with low sodium diet and exercise  Labs today        Relevant Medications   valsartan (DIOVAN) 80 MG tablet   simvastatin (ZOCOR) 20 MG tablet   metoprolol succinate (TOPROL-XL) 50 MG 24 hr tablet   Other Relevant Orders   CBC with Differential/Platelet   Comprehensive metabolic panel   TSH   Lipid panel     Musculoskeletal and Integument   Skin  lesion    Resembles irritated SK on L nasal bridge but cannot r/o an early skin cancer  It is bothersome  Will ref to derm for eval  inst to keep it clean and dry       Relevant Orders   Ambulatory referral to Dermatology   Osteoporosis    Post menopausal female on evista and miacalcin with hx of fractures (none recent) and fall risk  She declines further dexa We will continue to tx to prevent fx  Disc need for calcium/ vitamin D/ wt bearing exercise and bone density test every 2 y to monitor Disc safety/ fracture risk in detail        Relevant Medications   raloxifene (EVISTA) 60 MG tablet   calcitonin, salmon, (MIACALCIN/FORTICAL) 200 UNIT/ACT nasal spray     Other   Vitamin D deficiency    Pt has OP Takes D daily Disc imp to bone and overall health      Relevant Orders   VITAMIN D 25 Hydroxy (Vit-D Deficiency, Fractures)   MEMORY LOSS    Pt has symptoms of MCI and family watches her carefully  Still independent with need for reminders  Disc imp of socialization and physical activity for memory preservation and help with cognition Declines medication       Hyperlipidemia    This has been controlled with simvastatin and diet  Disc goals for lipids and reasons to control them Rev labs with pt (from the past)  Rev low sat fat diet in detail       Relevant Medications     valsartan (DIOVAN) 80 MG tablet   simvastatin (ZOCOR) 20 MG tablet   metoprolol succinate (TOPROL-XL) 50 MG 24 hr tablet   Other Relevant Orders   Lipid panel

## 2016-05-28 LAB — CBC WITH DIFFERENTIAL/PLATELET
BASOS ABS: 0.1 10*3/uL (ref 0.0–0.1)
Basophils Relative: 1.2 % (ref 0.0–3.0)
Eosinophils Absolute: 0.2 10*3/uL (ref 0.0–0.7)
Eosinophils Relative: 2.1 % (ref 0.0–5.0)
HEMATOCRIT: 35.2 % — AB (ref 36.0–46.0)
HEMOGLOBIN: 11.5 g/dL — AB (ref 12.0–15.0)
Lymphocytes Relative: 34 % (ref 12.0–46.0)
Lymphs Abs: 2.6 10*3/uL (ref 0.7–4.0)
MCHC: 32.7 g/dL (ref 30.0–36.0)
MCV: 87.9 fl (ref 78.0–100.0)
Monocytes Absolute: 0.6 10*3/uL (ref 0.1–1.0)
Monocytes Relative: 7.6 % (ref 3.0–12.0)
NEUTROS PCT: 55.1 % (ref 43.0–77.0)
Neutro Abs: 4.1 10*3/uL (ref 1.4–7.7)
Platelets: 150 10*3/uL (ref 150.0–400.0)
RBC: 4.01 Mil/uL (ref 3.87–5.11)
RDW: 14.3 % (ref 11.5–15.5)
WBC: 7.5 10*3/uL (ref 4.0–10.5)

## 2016-05-28 LAB — COMPREHENSIVE METABOLIC PANEL
ALBUMIN: 4 g/dL (ref 3.5–5.2)
ALK PHOS: 47 U/L (ref 39–117)
ALT: 9 U/L (ref 0–35)
AST: 18 U/L (ref 0–37)
BILIRUBIN TOTAL: 0.2 mg/dL (ref 0.2–1.2)
BUN: 19 mg/dL (ref 6–23)
CO2: 26 mEq/L (ref 19–32)
Calcium: 9.3 mg/dL (ref 8.4–10.5)
Chloride: 106 mEq/L (ref 96–112)
Creatinine, Ser: 1.02 mg/dL (ref 0.40–1.20)
GFR: 54.55 mL/min — AB (ref >60.00)
GLUCOSE: 91 mg/dL (ref 70–99)
Potassium: 4.6 mEq/L (ref 3.5–5.1)
Sodium: 137 mEq/L (ref 135–145)
Total Protein: 7.1 g/dL (ref 6.0–8.3)

## 2016-05-28 LAB — LIPID PANEL
CHOL/HDL RATIO: 2
Cholesterol: 136 mg/dL (ref 0–200)
HDL: 57 mg/dL (ref >39.00)
LDL Cholesterol: 58 mg/dL (ref 0–99)
NONHDL: 79.17
TRIGLYCERIDES: 104 mg/dL (ref 0.0–149.0)
VLDL: 20.8 mg/dL (ref 0.0–40.0)

## 2016-05-28 LAB — TSH: TSH: 2.69 u[IU]/mL (ref 0.35–4.50)

## 2016-05-28 LAB — VITAMIN D 25 HYDROXY (VIT D DEFICIENCY, FRACTURES): VITD: 39.78 ng/mL (ref 30.00–100.00)

## 2016-05-28 NOTE — Assessment & Plan Note (Signed)
Pt has symptoms of MCI and family watches her carefully  Still independent with need for reminders  Disc imp of socialization and physical activity for memory preservation and help with cognition Declines medication

## 2016-05-28 NOTE — Assessment & Plan Note (Signed)
This has been controlled with simvastatin and diet  Disc goals for lipids and reasons to control them Rev labs with pt (from the past)  Rev low sat fat diet in detail

## 2016-05-28 NOTE — Assessment & Plan Note (Signed)
Pt has OP Takes D daily Disc imp to bone and overall health

## 2016-05-28 NOTE — Assessment & Plan Note (Signed)
Resembles irritated SK on L nasal bridge but cannot r/o an early skin cancer  It is bothersome  Will ref to derm for eval  inst to keep it clean and dry

## 2016-05-28 NOTE — Assessment & Plan Note (Signed)
Post menopausal female on evista and miacalcin with hx of fractures (none recent) and fall risk  She declines further dexa We will continue to tx to prevent fx  Disc need for calcium/ vitamin D/ wt bearing exercise and bone density test every 2 y to monitor Disc safety/ fracture risk in detail

## 2016-05-28 NOTE — Assessment & Plan Note (Signed)
bp in fair control at this time  BP Readings from Last 1 Encounters:  05/27/16 134/82   No changes needed Disc lifstyle change with low sodium diet and exercise  Labs today

## 2016-05-29 ENCOUNTER — Encounter: Payer: Self-pay | Admitting: *Deleted

## 2016-06-11 ENCOUNTER — Telehealth: Payer: Self-pay | Admitting: Family Medicine

## 2016-06-11 NOTE — Telephone Encounter (Signed)
LM for pt to sch AWV with Katha Cabal, [place]

## 2016-07-14 ENCOUNTER — Encounter: Payer: Self-pay | Admitting: Family Medicine

## 2016-08-12 ENCOUNTER — Telehealth: Payer: Self-pay | Admitting: Family Medicine

## 2016-08-12 NOTE — Telephone Encounter (Signed)
Left message on home phone asking pt to call office °Pt needs to schedule medicare wellness appointment before 09/19/16 per brian email dated 08/12/16 °

## 2016-08-28 ENCOUNTER — Ambulatory Visit (INDEPENDENT_AMBULATORY_CARE_PROVIDER_SITE_OTHER): Payer: Commercial Managed Care - HMO

## 2016-08-28 VITALS — BP 124/70 | HR 69 | Temp 97.8°F | Ht 59.0 in | Wt 124.2 lb

## 2016-08-28 DIAGNOSIS — Z0000 Encounter for general adult medical examination without abnormal findings: Secondary | ICD-10-CM | POA: Diagnosis not present

## 2016-08-28 DIAGNOSIS — Z23 Encounter for immunization: Secondary | ICD-10-CM

## 2016-08-28 NOTE — Progress Notes (Signed)
PCP notes:   Health maintenance:  Flu vaccine - administered Shingles - postponed/insurance Bone density - previously declined  Abnormal screenings:   Hearing - failed Mini-Cog score: 11/20  Patient concerns:   Pt has developed another non-healing wound to left side of face near nose. PCP notified. Pt was encouraged to speak to Referral Coordinator at checkout to schedule dermatology referral per PCP instructions.   Nurse concerns:  None  Next PCP appt:   Pt will return in 1 yr to see PCP.   I reviewed health advisor's note, was available for consultation, and agree with documentation and plan.  Loura Pardon MD

## 2016-08-28 NOTE — Patient Instructions (Signed)
Amanda Salas , Thank you for taking time to come for your Medicare Wellness Visit. I appreciate your ongoing commitment to your health goals. Please review the following plan we discussed and let me know if I can assist you in the future.   These are the goals we discussed: Goals    . Increase water intake          Starting 08/28/2016, I will attempt to drink at least 6-8 glasses of water daily.        This is a list of the screening recommended for you and due dates:  Health Maintenance  Topic Date Due  . Shingles Vaccine  08/28/2017*  . DEXA scan (bone density measurement)  08/28/2026*  . Tetanus Vaccine  12/12/2019  . Flu Shot  Completed  . Pneumonia vaccines  Completed  *Topic was postponed. The date shown is not the original due date.   Preventive Care for Adults  A healthy lifestyle and preventive care can promote health and wellness. Preventive health guidelines for adults include the following key practices.  . A routine yearly physical is a good way to check with your health care provider about your health and preventive screening. It is a chance to share any concerns and updates on your health and to receive a thorough exam.  . Visit your dentist for a routine exam and preventive care every 6 months. Brush your teeth twice a day and floss once a day. Good oral hygiene prevents tooth decay and gum disease.  . The frequency of eye exams is based on your age, health, family medical history, use  of contact lenses, and other factors. Follow your health care provider's ecommendations for frequency of eye exams.  . Eat a healthy diet. Foods like vegetables, fruits, whole grains, low-fat dairy products, and lean protein foods contain the nutrients you need without too many calories. Decrease your intake of foods high in solid fats, added sugars, and salt. Eat the right amount of calories for you. Get information about a proper diet from your health care provider, if necessary.  .  Regular physical exercise is one of the most important things you can do for your health. Most adults should get at least 150 minutes of moderate-intensity exercise (any activity that increases your heart rate and causes you to sweat) each week. In addition, most adults need muscle-strengthening exercises on 2 or more days a week.  Silver Sneakers may be a benefit available to you. To determine eligibility, you may visit the website: www.silversneakers.com or contact program at 718-709-5256 Mon-Fri between 8AM-8PM.   . Maintain a healthy weight. The body mass index (BMI) is a screening tool to identify possible weight problems. It provides an estimate of body fat based on height and weight. Your health care provider can find your BMI and can help you achieve or maintain a healthy weight.   For adults 20 years and older: ? A BMI below 18.5 is considered underweight. ? A BMI of 18.5 to 24.9 is normal. ? A BMI of 25 to 29.9 is considered overweight. ? A BMI of 30 and above is considered obese.   . Maintain normal blood lipids and cholesterol levels by exercising and minimizing your intake of saturated fat. Eat a balanced diet with plenty of fruit and vegetables. Blood tests for lipids and cholesterol should begin at age 54 and be repeated every 5 years. If your lipid or cholesterol levels are high, you are over 50, or you are at  high risk for heart disease, you may need your cholesterol levels checked more frequently. Ongoing high lipid and cholesterol levels should be treated with medicines if diet and exercise are not working.  . If you smoke, find out from your health care provider how to quit. If you do not use tobacco, please do not start.  . If you choose to drink alcohol, please do not consume more than 2 drinks per day. One drink is considered to be 12 ounces (355 mL) of beer, 5 ounces (148 mL) of wine, or 1.5 ounces (44 mL) of liquor.  . If you are 71-37 years old, ask your health care  provider if you should take aspirin to prevent strokes.  . Use sunscreen. Apply sunscreen liberally and repeatedly throughout the day. You should seek shade when your shadow is shorter than you. Protect yourself by wearing long sleeves, pants, a wide-brimmed hat, and sunglasses year round, whenever you are outdoors.  . Once a month, do a whole body skin exam, using a mirror to look at the skin on your back. Tell your health care provider of new moles, moles that have irregular borders, moles that are larger than a pencil eraser, or moles that have changed in shape or color.

## 2016-08-28 NOTE — Progress Notes (Signed)
Subjective:   Amanda Salas is a 82 y.o. female who presents for Medicare Annual (Subsequent) preventive examination.  Review of Systems:  N/A Cardiac Risk Factors include: advanced age (>64men, >16 women);dyslipidemia;hypertension;sedentary lifestyle     Objective:     Vitals: BP 124/70 (BP Location: Left Arm, Patient Position: Sitting, Cuff Size: Normal)   Pulse 69   Temp 97.8 F (36.6 C) (Oral)   Ht 4\' 11"  (1.499 m)   Wt 124 lb 4 oz (56.4 kg)   SpO2 97%   BMI 25.10 kg/m   Body mass index is 25.1 kg/m.   Tobacco History  Smoking Status  . Never Smoker  Smokeless Tobacco  . Never Used     Counseling given: No   Past Medical History:  Diagnosis Date  . Allergy   . Arthritis   . Cataract   . Disorder of bone and cartilage, unspecified   . GERD (gastroesophageal reflux disease)   . Hepatitis   . Hyperlipidemia   . Hypertension   . Thrombocytopenia, unspecified ([place])    Past Surgical History:  Procedure Laterality Date  . CARDIAC CATHETERIZATION    . ESOPHAGOGASTRODUODENOSCOPY     Family History  Problem Relation Age of Onset  . Alzheimer's disease Mother    History  Sexual Activity  . Sexual activity: No    Outpatient Encounter Prescriptions as of 08/28/2016  Medication Sig  . calcitonin, salmon, (MIACALCIN/FORTICAL) 200 UNIT/ACT nasal spray USE 1 SPRAY INTO ALTERNATE NOSTRILS DAILY  . cholecalciferol (VITAMIN D) 1000 UNITS tablet Take 1,000 Units by mouth daily.  . metoprolol succinate (TOPROL-XL) 50 MG 24 hr tablet Take 1 tablet (50 mg total) by mouth daily. Take with or immediately following a meal.  . raloxifene (EVISTA) 60 MG tablet Take 1 tablet (60 mg total) by mouth daily.  . simvastatin (ZOCOR) 20 MG tablet Take 1 tablet (20 mg total) by mouth at bedtime.  . valsartan (DIOVAN) 80 MG tablet Take 1 tablet (80 mg total) by mouth daily.  Marland Kitchen zoster vaccine live, PF, (ZOSTAVAX) 60454 UNT/0.65ML injection Inject 19,400 Units into the skin once.     No facility-administered encounter medications on file as of 08/28/2016.     Activities of Daily Living In your present state of health, do you have any difficulty performing the following activities: 08/28/2016  Hearing? N  Vision? N  Difficulty concentrating or making decisions? Y  Walking or climbing stairs? N  Dressing or bathing? N  Doing errands, shopping? Y  Preparing Food and eating ? N  Using the Toilet? N  In the past six months, have you accidently leaked urine? N  Do you have problems with loss of bowel control? N  Managing your Medications? N  Managing your Finances? N  Housekeeping or managing your Housekeeping? N  Some recent data might be hidden    Patient Care Team: Abner Greenspan, MD as PCP - General    Assessment:     Hearing Screening   125Hz  250Hz  500Hz  1000Hz  2000Hz  3000Hz  4000Hz  6000Hz  8000Hz   Right ear:   40 40 40  0    Left ear:   40 0 40  40      Visual Acuity Screening   Right eye Left eye Both eyes  Without correction:     With correction: 20/50 20/25 20/30     Exercise Activities and Dietary recommendations Current Exercise Habits: The patient does not participate in regular exercise at present, Exercise limited by: None identified  Goals    . Increase water intake          Starting 08/28/2016, I will attempt to drink at least 6-8 glasses of water daily.       Fall Risk Fall Risk  08/28/2016 04/23/2015 04/17/2014 03/05/2013  Falls in the past year? No No Yes No  Number falls in past yr: - - 2 or more -  Risk Factor Category  - - High Fall Risk -   Depression Screen PHQ 2/9 Scores 08/28/2016 04/23/2015 04/17/2014 03/05/2013  PHQ - 2 Score 0 0 0 0     Cognitive Testing MMSE - Mini Mental State Exam 08/28/2016  Orientation to time 0  Orientation to time comments pt was unable to provide month or year  Orientation to Place 5  Registration 3  Attention/ Calculation 0  Recall 0  Recall-comments pt was unable to recall 3 of 3 words  Language-  name 2 objects 0  Language- repeat 1  Language- follow 3 step command 2  Language- follow 3 step command-comments pt was unable to follow 1 step of 3 step command  Language- read & follow direction 0  Write a sentence 0  Copy design 0  Total score 11   PLEASE NOTE: A Mini-Cog screen was completed. Maximum score is 20. A value of 0 denotes this part of Folstein MMSE was not completed or the patient failed this part of the Mini-Cog screening.   Mini-Cog Screening Orientation to Time - Max 5 pts Orientation to Place - Max 5 pts Registration - Max 3 pts Recall - Max 3 pts Language Repeat - Max 1 pts Language Follow 3 Step Command - Max 3 pts   Immunization History  Administered Date(s) Administered  . Influenza Split 11/04/2011, 03/03/2013  . Influenza Whole 10/04/2007, 12/06/2008, 10/21/2009, 10/21/2010  . Influenza,inj,Quad PF,36+ Mos 11/22/2013, 10/30/2014, 08/28/2016  . Pneumococcal Conjugate-13 06/01/2014  . Pneumococcal Polysaccharide-23 11/20/2005  . Td 09/19/1998, 12/11/2009   Screening Tests Health Maintenance  Topic Date Due  . ZOSTAVAX  08/28/2017 (Originally 11/30/1989)  . DEXA SCAN  08/28/2026 (Originally 11/30/1994)  . TETANUS/TDAP  12/12/2019  . INFLUENZA VACCINE  Completed  . PNA vac Low Risk Adult  Completed      Plan:     I have personally reviewed and addressed the Medicare Annual Wellness questionnaire and have noted the following in the patient's chart:  A. Medical and social history B. Use of alcohol, tobacco or illicit drugs  C. Current medications and supplements D. Functional ability and status E.  Nutritional status F.  Physical activity G. Advance directives H. List of other physicians I.  Hospitalizations, surgeries, and ER visits in previous 12 months J.  [place] to include hearing, vision, cognitive, depression L. Referrals and appointments - none  In addition, I have reviewed and discussed with patient certain preventive  protocols, quality metrics, and best practice recommendations. A written personalized care plan for preventive services as well as general preventive health recommendations were provided to patient.  See attached scanned questionnaire for additional information.   Signed,   Lindell Noe, MHA, BS, LPN Health Advisor

## 2016-08-28 NOTE — Progress Notes (Signed)
Pre visit review using our clinic review tool, if applicable. No additional management support is needed unless otherwise documented below in the visit note. 

## 2016-10-01 DIAGNOSIS — C44319 Basal cell carcinoma of skin of other parts of face: Secondary | ICD-10-CM | POA: Diagnosis not present

## 2016-10-01 DIAGNOSIS — L821 Other seborrheic keratosis: Secondary | ICD-10-CM | POA: Diagnosis not present

## 2016-10-01 DIAGNOSIS — D485 Neoplasm of uncertain behavior of skin: Secondary | ICD-10-CM | POA: Diagnosis not present

## 2016-12-24 DIAGNOSIS — C44319 Basal cell carcinoma of skin of other parts of face: Secondary | ICD-10-CM | POA: Diagnosis not present

## 2017-01-14 DIAGNOSIS — C44319 Basal cell carcinoma of skin of other parts of face: Secondary | ICD-10-CM | POA: Diagnosis not present

## 2017-05-14 ENCOUNTER — Ambulatory Visit: Payer: Commercial Managed Care - HMO | Admitting: Family Medicine

## 2017-05-19 ENCOUNTER — Telehealth: Payer: Self-pay | Admitting: Family Medicine

## 2017-05-19 NOTE — Telephone Encounter (Signed)
PLEASE NOTE: All timestamps contained within this report are represented as [place] Standard Time. CONFIDENTIALTY NOTICE: This fax transmission is intended only for the addressee. It contains information that is legally privileged, confidential or otherwise protected from use or disclosure. If you are not the intended recipient, you are strictly prohibited from reviewing, disclosing, copying using or disseminating any of this information or taking any action in reliance on or regarding this information. If you have received this fax in error, please notify us immediately by telephone so that we can arrange for its return to [place]. Phone: [phone], Toll-Free: [phone], Fax: [phone] Page: 1 of 2 Call Id: [id number] [place] Patient Name: Amanda Salas Gender: Female DOB: [birth year] Age: 83 Y 74 M 19 D Return Phone Number: [phone] (Primary), [phone] (Secondary) City/State/Zip: [place] Client [facility] Client Client Site [facility], Roque Lias - MD Who Is Calling Patient / Member / Family / Caregiver Call Type Triage / Clinical Caller Name [name] Relationship To Patient Daughter Return Phone Number [phone] (Secondary) Chief Complaint Cough Reason for Call Symptomatic / Request for [place] states her Mother has a cough and congestion. It has been going on for four days. No fever. Appointment Disposition EMR Appointment Not Necessary Info pasted into Epic Yes Nurse Assessment Nurse: Ronnald Ramp, RN, Miranda Date/Time (Eastern Time): 05/19/2017 9:24:10 AM Confirm and document reason for call. If symptomatic, describe symptoms. ---Caller states her mother has had a cough and congestion for 4 days. Does the PT have any chronic conditions? (i.e. diabetes, asthma,  etc.) ---Yes List chronic conditions. ---HTN, High Cholesterol, Allergies Guidelines Guideline Title Affirmed Question Cough - Acute Productive Cough Disp. Time Eilene Ghazi Time) Disposition Final User 05/19/2017 9:30:52 AM Home Care Yes Ronnald Ramp, RN, [place] Advice Given Per Guideline HOME CARE: You should be able to treat this at home. REASSURANCE: * It doesn't sound like a serious cough. COUGH MEDICINES: * OTC COUGH SYRUPS: The most common cough suppressant in OTC cough medications is dextromethorphan. Often the letters 'DM' appear in the name. * OTC COUGH DROPS: Cough drops can help a lot, especially for mild coughs. They reduce coughing by soothing your irritated throat and removing that tickle sensation in the back of the throat. Cough drops also have the advantage of portability - you can carry them with you. * HOME REMEDY - HARD CANDY: Hard candy works just as well as medicine-flavored OTC cough drops. People who have diabetes should use sugar-free candy. * HOME REMEDY - HONEY: This old home remedy has been shown to help decrease coughing at night. The adult dosage is 2 teaspoons (10 ml) at bedtime. Honey should not be given to infants under one year of age. HUMIDIFIER: If the air is dry, use a humidifier in the bedroom. (Reason: dry air makes coughs worse) PREVENT DEHYDRATION: * Drink adequate liquids. * This will help soothe an irritated or dry throat and loosen up the phlegm. EXPECTED COURSE: Viral bronchitis causes a cough for 1 to 3 weeks. Sometimes you may cough up lots of phlegm (mucus). The mucus can normally be white, gray, yellow or green. CALL BACK IF: * Cough lasts over 3 weeks * Continuous coughing persists over 2 hours after cough treatment * Difficulty breathing occurs * Fever over 103 F (39.4 C) * Fever lasts over 3 days * You become worse. CARE ADVICE  given per Cough - Acute Productive (Adult) guideline. REASSURANCE: PLEASE NOTE: All timestamps contained within this  report are represented as [place] Standard Time. CONFIDENTIALTY NOTICE: This fax transmission is intended only for the addressee. It contains information that is legally privileged, confidential or otherwise protected from use or disclosure. If you are not the intended recipient, you are strictly prohibited from reviewing, disclosing, copying using or disseminating any of this information or taking any action in reliance on or regarding this information. If you have received this fax in error, please notify us immediately by telephone so that we can arrange for its return to [place]. Phone: [phone], Toll-Free: [phone], Fax: [phone] Page: 2 of 2 Call Id: [id number] Care Advice Given Per Guideline * It sounds like an uncomplicated cold that we can treat at home. * Colds are very common and may make you feel uncomfortable. FOR A RUNNY NOSE - BLOW YOUR NOSE: * Nasal mucus and discharge help wash viruses and bacteria out of the nose and sinuses. * Blowing your nose helps clean out your nose. Use a handkerchief or a paper tissue. * Introduction: Saline (salt water) nasal irrigation (nasal wash) is an effective and simple home remedy for treating stuffy nose and sinus congestion. The nose can be irrigated by pouring, spraying, or squirting salt water into the nose and then letting it run back out. FOR A STUFFY NOSE - USE NASAL WASHES: * How it Helps: The salt water rinses out excess mucus, washes out any irritants (dust, allergens) that might be present, and moistens the nasal cavity. CALL BACK IF: * Fever lasts over 3 days * Nasal discharge lasts over 10 days * Earache or facial pain develops * You become worse.

## 2017-05-19 NOTE — Telephone Encounter (Signed)
Patient Name: Amanda Salas DOB: Nov 18, 1927 Initial Comment Caller states her Mother has a cough and congestion. It has been going on for four days. No fever. Nurse Assessment Nurse: Ronnald Ramp, RN, Miranda Date/Time (Eastern Time): 05/19/2017 9:24:10 AM Confirm and document reason for call. If symptomatic, describe symptoms. ---Caller states her mother has had a cough and congestion for 4 days. Does the patient have any new or worsening symptoms? ---Yes Will a triage be completed? ---Yes Related visit to physician within the last 2 weeks? ---No Does the PT have any chronic conditions? (i.e. diabetes, asthma, etc.) ---Yes List chronic conditions. ---HTN, High Cholesterol, Allergies Is this a behavioral health or substance abuse call? ---No Guidelines Guideline Title Affirmed Question Affirmed Notes Cough - Acute Productive Cough Final Disposition User Home Care Ronnald Ramp, RN, Miranda Disagree/Comply: Comply

## 2017-05-31 ENCOUNTER — Other Ambulatory Visit: Payer: Self-pay | Admitting: Family Medicine

## 2017-05-31 NOTE — Telephone Encounter (Signed)
Electronic refill request. Last office visit:   05/27/16 Last Filled:    90 tablet 11 05/27/2016  Does the patient need an OV and/or CPE?

## 2017-05-31 NOTE — Telephone Encounter (Signed)
Please schedule a 30 minute f/u with me this summer and refill until then Thanks

## 2017-06-01 NOTE — Telephone Encounter (Signed)
Left voicemail requesting daughter to call the office back and schedule f/u for pt

## 2017-06-02 NOTE — Telephone Encounter (Signed)
Pt scheduled appt with More[facility] and med refilled

## 2017-06-03 ENCOUNTER — Encounter: Payer: Self-pay | Admitting: Family Medicine

## 2017-06-03 ENCOUNTER — Ambulatory Visit (INDEPENDENT_AMBULATORY_CARE_PROVIDER_SITE_OTHER): Payer: Medicare HMO | Admitting: Family Medicine

## 2017-06-03 DIAGNOSIS — S22080S Wedge compression fracture of T11-T12 vertebra, sequela: Secondary | ICD-10-CM

## 2017-06-03 DIAGNOSIS — M545 Low back pain: Secondary | ICD-10-CM | POA: Diagnosis not present

## 2017-06-03 DIAGNOSIS — S32010S Wedge compression fracture of first lumbar vertebra, sequela: Secondary | ICD-10-CM | POA: Diagnosis not present

## 2017-06-03 NOTE — Progress Notes (Signed)
Still on baseline meds.  H/o osteoporosis, on treatment.   H/o fracture in the past, with previous compression fractures noted on imaging. Discussed with patient and family.  H/o intermittent pain but clearly worse in the last 2 weeks, more frequent and more constant.  More AM pain, more trouble getting out of bed due to pain. The pain gets slightly better when she gets up and moves around during the day, as the day goes on.  She has been taking low doses of tylenol for pain, that helps some but only for a brief period of time.  Icy hot seems to help.  No falls.  No vomiting, no diarrhea.  No dysuria. No bruising.   Bilateral lower back pain just above the waistline.  Meds, vitals, and allergies reviewed.   ROS: Per HPI unless specifically indicated in ROS section   nad ncat Memory loss noted, she repeats herself during the conversation. She tells me multiple times during the conversation, "my memory is bad", seemingly without remembering that she just told me that.  Mmm Neck supple, no LA rrr abd soft Back w/o midline tenderness but bilateral lower back is tender in the paraspinal area. No CVA pain. No bruising. No rash. Extremities without edema. Able to bear weight.

## 2017-06-03 NOTE — Patient Instructions (Signed)
Try taking 1 aleve a day with food.  Taking it at night may help more the next AM.  See if that does better.  Take 2 extra tylenol up to 3 times a day.   Continue the nasal spray.  Update [place] as needed.  Take care.  Glad to see you.

## 2017-06-04 NOTE — Assessment & Plan Note (Signed)
>  25 minutes spent in face to face time with patient, >50% spent in counselling or coordination of care. She has known history of compression fractures. I suspect that this is causing her pain now. She could have a new compression fracture. She could have progression of old fractures. Pathophysiology discussed with patient. Discussed with family. She could have subsequent muscle spasm as a downstream effect. Her sensation is intact in the legs. Straight leg raise negative bilaterally. No weakness. Imaging at this point is not needed as she did not want to proceed with more aggressive treatment such as kyphoplasty. Would continue her nasal spray, add on 1 Aleve per day with food. Routine cautions given. Take Tylenol 3 times a day. Continue icy hot for the muscle spasms in her back. Update [place] if not better. See after visit summary. All agree.

## 2017-06-15 ENCOUNTER — Other Ambulatory Visit: Payer: Self-pay | Admitting: Family Medicine

## 2017-06-17 ENCOUNTER — Other Ambulatory Visit: Payer: Self-pay | Admitting: Family Medicine

## 2017-07-07 ENCOUNTER — Encounter: Payer: Self-pay | Admitting: Family Medicine

## 2017-07-07 ENCOUNTER — Ambulatory Visit (INDEPENDENT_AMBULATORY_CARE_PROVIDER_SITE_OTHER): Payer: Medicare HMO | Admitting: Family Medicine

## 2017-07-07 VITALS — BP 135/60 | HR 73 | Temp 98.1°F | Ht 59.0 in | Wt 114.8 lb

## 2017-07-07 DIAGNOSIS — S32010S Wedge compression fracture of first lumbar vertebra, sequela: Secondary | ICD-10-CM | POA: Diagnosis not present

## 2017-07-07 DIAGNOSIS — D649 Anemia, unspecified: Secondary | ICD-10-CM

## 2017-07-07 DIAGNOSIS — I10 Essential (primary) hypertension: Secondary | ICD-10-CM | POA: Diagnosis not present

## 2017-07-07 DIAGNOSIS — E7800 Pure hypercholesterolemia, unspecified: Secondary | ICD-10-CM | POA: Diagnosis not present

## 2017-07-07 DIAGNOSIS — S22080S Wedge compression fracture of T11-T12 vertebra, sequela: Secondary | ICD-10-CM

## 2017-07-07 DIAGNOSIS — D696 Thrombocytopenia, unspecified: Secondary | ICD-10-CM | POA: Diagnosis not present

## 2017-07-07 DIAGNOSIS — E559 Vitamin D deficiency, unspecified: Secondary | ICD-10-CM | POA: Diagnosis not present

## 2017-07-07 DIAGNOSIS — R413 Other amnesia: Secondary | ICD-10-CM

## 2017-07-07 DIAGNOSIS — M8000XS Age-related osteoporosis with current pathological fracture, unspecified site, sequela: Secondary | ICD-10-CM | POA: Diagnosis not present

## 2017-07-07 LAB — COMPREHENSIVE METABOLIC PANEL
ALK PHOS: 59 U/L (ref 33–130)
ALT: 11 U/L (ref 6–29)
AST: 19 U/L (ref 10–35)
Albumin: 4.1 g/dL (ref 3.6–5.1)
BUN: 18 mg/dL (ref 7–25)
CALCIUM: 8.9 mg/dL (ref 8.6–10.4)
CO2: 23 mmol/L (ref 20–31)
Chloride: 103 mmol/L (ref 98–110)
Creat: 0.86 mg/dL (ref 0.60–0.88)
GLUCOSE: 88 mg/dL (ref 65–99)
Potassium: 4.6 mmol/L (ref 3.5–5.3)
Sodium: 136 mmol/L (ref 135–146)
TOTAL PROTEIN: 7 g/dL (ref 6.1–8.1)
Total Bilirubin: 0.3 mg/dL (ref 0.2–1.2)

## 2017-07-07 LAB — CBC WITH DIFFERENTIAL/PLATELET
BASOS PCT: 0 %
Basophils Absolute: 0 cells/uL (ref 0–200)
EOS ABS: 332 {cells}/uL (ref 15–500)
Eosinophils Relative: 4 %
HEMATOCRIT: 35.2 % (ref 35.0–45.0)
HEMOGLOBIN: 11.5 g/dL — AB (ref 11.7–15.5)
LYMPHS ABS: 2573 {cells}/uL (ref 850–3900)
Lymphocytes Relative: 31 %
MCH: 28.7 pg (ref 27.0–33.0)
MCHC: 32.7 g/dL (ref 32.0–36.0)
MCV: 87.8 fL (ref 80.0–100.0)
MONO ABS: 830 {cells}/uL (ref 200–950)
MPV: 11.7 fL (ref 7.5–12.5)
Monocytes Relative: 10 %
NEUTROS ABS: 4565 {cells}/uL (ref 1500–7800)
Neutrophils Relative %: 55 %
Platelets: 178 10*3/uL (ref 140–400)
RBC: 4.01 MIL/uL (ref 3.80–5.10)
RDW: 15.1 % — AB (ref 11.0–15.0)
WBC: 8.3 10*3/uL (ref 3.8–10.8)

## 2017-07-07 LAB — LIPID PANEL
CHOLESTEROL: 150 mg/dL (ref <200)
HDL: 63 mg/dL (ref >50)
LDL Cholesterol: 66 mg/dL (ref <100)
Total CHOL/HDL Ratio: 2.4 Ratio (ref <5.0)
Triglycerides: 104 mg/dL (ref <150)
VLDL: 21 mg/dL (ref <30)

## 2017-07-07 LAB — PHOSPHORUS: PHOSPHORUS: 3.4 mg/dL (ref 2.1–4.3)

## 2017-07-07 LAB — TSH: TSH: 3.28 m[IU]/L

## 2017-07-07 MED ORDER — METOPROLOL SUCCINATE ER 50 MG PO TB24: ORAL_TABLET | ORAL | 3 refills | Status: DC

## 2017-07-07 MED ORDER — SIMVASTATIN 20 MG PO TABS: 20.0000 mg | ORAL_TABLET | Freq: Every day | ORAL | 3 refills | Status: DC

## 2017-07-07 MED ORDER — VALSARTAN 80 MG PO TABS: 80.0000 mg | ORAL_TABLET | Freq: Every day | ORAL | 3 refills | Status: DC

## 2017-07-07 MED ORDER — CALCITONIN (SALMON) 200 UNIT/ACT NA SOLN: NASAL | 3 refills | Status: DC

## 2017-07-07 NOTE — Progress Notes (Signed)
Subjective:    Patient ID: Amanda Salas, female    DOB: [birth year], 83 y.o.   MRN: [id number]  HPI Here for f/u of chronic health problems  Living with grand daughter now -helpful   Wt Readings from Last 3 Encounters:  07/07/17 114 lb 12 oz (52.1 kg)  06/03/17 113 lb 12 oz (51.6 kg)  08/28/16 124 lb 4 oz (56.4 kg)  she cannot remember to eat regulary Family aware so she started protein shakes /lives with grandchild is helping with meals and monitoring  23.18 kg/m  She suffered a compression fracture of TS (suspected)- treating pain with tylenol  Her back still hurts -but improved from what it was   abd hurts also  (has not c/o at home) - may be positional or from her back  No n/v or fever or other symptoms  Constipated off and on (takes metamucil)   OP On evista ad miacalcin  Has had several comp fx in the past and currently thinks one is improving  Last D level was 39 Taking calcium and D and drinking a protein drink daily with extra nutrients   Memory status - MCI Worsening- lives with grandchild in her own apartment -still quite independent  11/20 on last mini cog in the fall  Declined medication -still the case   Saw derm- removed spots on nose and forehead -better now   bp is up on first check today  Better on 2nd check BP: 135/60   No cp or palpitations or headaches or edema  No side effects to medicines  BP Readings from Last 3 Encounters:  07/07/17 (!) 142/70  06/03/17 122/66  08/28/16 124/70     Cholesterol Lab Results  Component Value Date   CHOL 136 05/27/2016   HDL 57.00 05/27/2016   LDLCALC 58 05/27/2016   LDLDIRECT 81.0 07/01/2010   TRIG 104.0 05/27/2016   CHOLHDL 2 05/27/2016   On zocor and diet   Patient Active Problem List   Diagnosis Date Noted  . Skin lesion 05/27/2016  . Dystrophic nail 04/23/2015  . Encounter for Medicare annual wellness exam 04/17/2014  . Colon cancer screening 04/17/2014  . Anemia 04/17/2014  .  Compression fracture of thoracolumbar vertebra ([place]) 12/26/2013  . Low back pain 11/22/2013  . Contact dermatitis 11/04/2011  . 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 Vitamin D deficiency 07/01/2010  . FALL, HX OF 07/01/2010  . Hyperlipidemia 08/10/2008  . THROMBOCYTOPENIA, CHRONIC 08/10/2008  . Essential hypertension 08/10/2008  . ALLERGIC RHINITIS 08/10/2008  . GERD 08/10/2008  . Osteoporosis 08/10/2008   Past Medical History:  Diagnosis Date  . Allergy   . Arthritis   . Cataract   . Disorder of bone and cartilage, unspecified   . GERD (gastroesophageal reflux disease)   . Hepatitis   . Hyperlipidemia   . Hypertension   . Thrombocytopenia, unspecified ([place])    Past Surgical History:  Procedure Laterality Date  . CARDIAC CATHETERIZATION    . ESOPHAGOGASTRODUODENOSCOPY     Social History  Substance Use Topics  . Smoking status: Never Smoker  . Smokeless tobacco: Never Used  . Alcohol use No   Family History  Problem Relation Age of Onset  . Alzheimer's disease Mother    Allergies  Allergen Reactions  . Ace Inhibitors     REACTION: cough  . Alendronate Sodium     REACTION: pill got stuck   Current Outpatient Prescriptions on File Prior to Visit  Medication  Sig Dispense Refill  . raloxifene (EVISTA) 60 MG tablet TAKE 1 TABLET BY MOUTH DAILY 30 tablet 2   No current facility-administered medications on file prior to visit.      Review of Systems Review of Systems  Constitutional: Negative for fever, appetite change, fatigue and unexpected weight change.  Eyes: Negative for pain and visual disturbance.  Respiratory: Negative for cough and shortness of breath.   Cardiovascular: Negative for cp or palpitations    Gastrointestinal: Negative for nausea, diarrhea and constipation.  Genitourinary: Negative for urgency and frequency.  Skin: Negative for pallor or rash   MSK pos for back pain from prev comp fx  Neurological: Negative for  weakness, light-headedness, numbness and headaches.  Hematological: Negative for adenopathy. Does not bruise/bleed easily.  Psychiatric/Behavioral: Negative for dysphoric mood. The patient is not nervous/anxious.  pos for progressive short term memory loss which frustrates her        Objective:   Physical Exam  Constitutional: She appears well-developed and well-nourished. No distress.  Frail appearing elderly female   HENT:  Head: Normocephalic and atraumatic.  Right Ear: External ear normal.  Left Ear: External ear normal.  Nose: Nose normal.  Mouth/Throat: Oropharynx is clear and moist.  Eyes: Pupils are equal, round, and reactive to light. Conjunctivae and EOM are normal. Right eye exhibits no discharge. Left eye exhibits no discharge. No scleral icterus.  Neck: Normal range of motion. Neck supple. No JVD present. Carotid bruit is not present. No thyromegaly present.  Cardiovascular: Normal rate, regular rhythm, normal heart sounds and intact distal pulses.  Exam reveals no gallop.   Pulmonary/Chest: Effort normal and breath sounds normal. No respiratory distress. She has no wheezes. She has no rales.  Abdominal: Soft. Bowel sounds are normal. She exhibits no distension and no mass. There is no tenderness.  Musculoskeletal: She exhibits no edema or tenderness.  Kyphosis noted   Some tenderness over lower TS  Limited flex and ext of TS and LS     Lymphadenopathy:    She has no cervical adenopathy.  Neurological: She is alert. She has normal reflexes. No cranial nerve deficit. She exhibits normal muscle tone. Coordination normal.  Skin: Skin is warm and dry. No rash noted. No erythema. No pallor.  No new bruising or skin changes  Psychiatric: She has a normal mood and affect. Cognition and memory are impaired. She exhibits abnormal recent memory.  Pleasant and talkative  Poor short term memory           Assessment & Plan:   Problem List Items Addressed This Visit       Cardiovascular and Mediastinum   Essential hypertension - Primary    bp in fair control at this time  BP Readings from Last 1 Encounters:  07/07/17 135/60   No changes needed Disc lifstyle change with low sodium diet and exercise  Labs ordered      Relevant Medications   metoprolol succinate (TOPROL-XL) 50 MG 24 hr tablet   simvastatin (ZOCOR) 20 MG tablet   valsartan (DIOVAN) 80 MG tablet   Other Relevant Orders   CBC with Differential/Platelet (Completed)   Comprehensive metabolic panel (Completed)   Lipid panel (Completed)   TSH (Completed)     Musculoskeletal and Integument   Compression fracture of thoracolumbar vertebra ([facility])    Slowly improving -more mobile now  Disc more aggressive tx of OP with Prolia if covered Disc fall risk  Continues tylenol prn for pain  Osteoporosis    Has had another spinal compression fx  On evista and miacalcin  Interested in prolia if it is covered- will look into that for her Continue ca and D Exercise as tolerated       Relevant Medications   calcitonin, salmon, (MIACALCIN/FORTICAL) 200 UNIT/ACT nasal spray   Other Relevant Orders   Phosphorus (Completed)     Other   Anemia    Cbc today      Relevant Orders   CBC with Differential/Platelet (Completed)   Hyperlipidemia    Disc goals for lipids and reasons to control them Rev labs with pt  (last check) Rev low sat fat diet in detail  Lipid panel today zocor and diet       Relevant Medications   metoprolol succinate (TOPROL-XL) 50 MG 24 hr tablet   simvastatin (ZOCOR) 20 MG tablet   valsartan (DIOVAN) 80 MG tablet   Other Relevant Orders   Lipid panel (Completed)   MEMORY LOSS    Age related MCI currently  Living with family member and fairly independent  Not interested in medication  Will continue to watch closely  Safety disc in detail       THROMBOCYTOPENIA, CHRONIC    Cbc today  No new bruising or bleeding       Relevant Orders   CBC with  Differential/Platelet (Completed)   Vitamin D deficiency    Lab today -on supplementation  Has OP with hx of spinal comp fx Disc imp to bone and overall health      Relevant Orders   VITAMIN D 25 Hydroxy (Vit-D Deficiency, Fractures) (Completed)

## 2017-07-07 NOTE — Patient Instructions (Addendum)
Labs today  Take tylenol as needed for back pain If back pain worsens or does not continue to improve please let me know  Make sure you drink fluids regularly   I'm interested in Prolia for your osteoporosis to prevent fractures  I will have one of our staff look into coverage for you   Avoid falling - use a walker if you need to

## 2017-07-08 LAB — VITAMIN D 25 HYDROXY (VIT D DEFICIENCY, FRACTURES): VIT D 25 HYDROXY: 25 ng/mL — AB (ref 30–100)

## 2017-07-08 NOTE — Assessment & Plan Note (Addendum)
bp in fair control at this time  BP Readings from Last 1 Encounters:  07/07/17 135/60   No changes needed Disc lifstyle change with low sodium diet and exercise  Labs ordered

## 2017-07-08 NOTE — Assessment & Plan Note (Signed)
Disc goals for lipids and reasons to control them Rev labs with pt  (last check) Rev low sat fat diet in detail  Lipid panel today zocor and diet

## 2017-07-08 NOTE — Assessment & Plan Note (Signed)
Lab today -on supplementation  Has OP with hx of spinal comp fx Disc imp to bone and overall health

## 2017-07-08 NOTE — Assessment & Plan Note (Signed)
Slowly improving -more mobile now  Disc more aggressive tx of OP with Prolia if covered Disc fall risk  Continues tylenol prn for pain

## 2017-07-08 NOTE — Assessment & Plan Note (Signed)
Age related MCI currently  Living with family member and fairly independent  Not interested in medication  Will continue to watch closely  Safety disc in detail

## 2017-07-08 NOTE — Assessment & Plan Note (Signed)
Cbc today

## 2017-07-08 NOTE — Assessment & Plan Note (Signed)
Cbc today  No new bruising or bleeding

## 2017-07-08 NOTE — Assessment & Plan Note (Signed)
Has had another spinal compression fx  On evista and miacalcin  Interested in prolia if it is covered- will look into that for her Continue ca and D Exercise as tolerated

## 2017-07-13 ENCOUNTER — Telehealth: Payer: Self-pay | Admitting: *Deleted

## 2017-07-13 NOTE — Telephone Encounter (Signed)
Information has been submitted to pts insurance for verification of benefits. Awaiting response for coverage  

## 2017-07-20 ENCOUNTER — Telehealth: Payer: Self-pay

## 2017-07-20 MED ORDER — LOSARTAN POTASSIUM 50 MG PO TABS: 50.0000 mg | ORAL_TABLET | Freq: Every day | ORAL | 1 refills | Status: DC

## 2017-07-20 NOTE — Telephone Encounter (Signed)
Amanda Salas with [facility] left v/m requesting cb about valsartan recall; request substitute med.Please advise.

## 2017-07-20 NOTE — Telephone Encounter (Signed)
Changed to losartan  Sent to her pharmacy  Please let her know

## 2017-07-20 NOTE — Telephone Encounter (Signed)
Left voicemail letting Lovena Le know to switch Rx to losartan and to left pt know

## 2017-07-27 NOTE — Telephone Encounter (Signed)
Form received indicating PA required. Form completed and placed in Dr Marliss Coots inbox for review and signature

## 2017-07-28 NOTE — Telephone Encounter (Signed)
Form completed and faxed back to requested party; awaiting response

## 2017-07-30 NOTE — Telephone Encounter (Signed)
Lm on pts vm requesting a call back 

## 2017-07-30 NOTE — Telephone Encounter (Signed)
Spoke to pt's daughter [name] and advised of out of pocket costs. She states she will discuss it with her mother and contact office back with a decision.

## 2017-07-30 NOTE — Telephone Encounter (Signed)
Verification of benefits have been processed and an approval has been received for pts prolia injection. Pts estimated cost are appx $210 if completed at a nurse visit and $230 if at an office visit. This is only an estimate and cannot be confirmed until benefits are paid. Please advise pt and schedule if needed. If scheduled, once the injection is received, pls contact me back with the date it was received so that I am able to update prolia folder. thanks

## 2017-08-19 ENCOUNTER — Other Ambulatory Visit: Payer: Self-pay | Admitting: Family Medicine

## 2017-09-20 ENCOUNTER — Other Ambulatory Visit: Payer: Self-pay | Admitting: *Deleted

## 2017-09-20 MED ORDER — RALOXIFENE HCL 60 MG PO TABS: 60.0000 mg | ORAL_TABLET | Freq: Every day | ORAL | 1 refills | Status: DC

## 2017-09-20 NOTE — Telephone Encounter (Signed)
Received fax saying pt would like Rx changed to a 90 day supply, done

## 2017-10-05 ENCOUNTER — Telehealth: Payer: Self-pay

## 2017-10-05 MED ORDER — DONEPEZIL HCL 10 MG PO TABS: 10.0000 mg | ORAL_TABLET | Freq: Every day | ORAL | 11 refills | Status: DC

## 2017-10-05 MED ORDER — DONEPEZIL HCL 10 MG PO TABS: 10.0000 mg | ORAL_TABLET | Freq: Every day | ORAL | 3 refills | Status: DC

## 2017-10-05 NOTE — Telephone Encounter (Signed)
[name] pts daughter (DPR signed) left v/m that pts memory is worsening and request med for memory to [facility] [address] [name] request cb, pt last seen 07/07/17 and med for memory was declined at that time.

## 2017-10-05 NOTE — Telephone Encounter (Signed)
I sent aricept 10 mg to [facility]   Start with 1/2 pill each bedtime for 2 weeks  If well tolerated then increase to 1 pill each bedtime Possible side eff incl nausea- please keep me posted   Follow up with me in December please to check in   This medication will not bring back lost memory but can slow down memory loss over time

## 2017-10-05 NOTE — Telephone Encounter (Signed)
Spoke to [place] and advised of new med and instruction; verbally expressed understanding. She will contact office back to schedule appt after speaking with pts granddaughter

## 2017-10-18 ENCOUNTER — Other Ambulatory Visit: Payer: Self-pay | Admitting: Family Medicine

## 2017-10-30 ENCOUNTER — Emergency Department (HOSPITAL_COMMUNITY): Payer: Medicare HMO

## 2017-10-30 ENCOUNTER — Inpatient Hospital Stay (HOSPITAL_COMMUNITY)
Admission: EM | Admit: 2017-10-30 | Discharge: 2017-11-04 | DRG: 470 | Disposition: A | Discharge status: Home Health | Payer: Medicare HMO | Attending: Internal Medicine | Admitting: Internal Medicine

## 2017-10-30 ENCOUNTER — Other Ambulatory Visit: Payer: Self-pay

## 2017-10-30 DIAGNOSIS — D72829 Elevated white blood cell count, unspecified: Secondary | ICD-10-CM | POA: Diagnosis present

## 2017-10-30 DIAGNOSIS — M810 Age-related osteoporosis without current pathological fracture: Secondary | ICD-10-CM | POA: Diagnosis present

## 2017-10-30 DIAGNOSIS — I10 Essential (primary) hypertension: Secondary | ICD-10-CM | POA: Diagnosis present

## 2017-10-30 DIAGNOSIS — J9811 Atelectasis: Secondary | ICD-10-CM | POA: Diagnosis not present

## 2017-10-30 DIAGNOSIS — S72011A Unspecified intracapsular fracture of right femur, initial encounter for closed fracture: Principal | ICD-10-CM | POA: Diagnosis present

## 2017-10-30 DIAGNOSIS — F0390 Unspecified dementia without behavioral disturbance: Secondary | ICD-10-CM | POA: Diagnosis present

## 2017-10-30 DIAGNOSIS — M4854XA Collapsed vertebra, not elsewhere classified, thoracic region, initial encounter for fracture: Secondary | ICD-10-CM | POA: Diagnosis not present

## 2017-10-30 DIAGNOSIS — Z96649 Presence of unspecified artificial hip joint: Secondary | ICD-10-CM

## 2017-10-30 DIAGNOSIS — Z96641 Presence of right artificial hip joint: Secondary | ICD-10-CM | POA: Diagnosis not present

## 2017-10-30 DIAGNOSIS — D696 Thrombocytopenia, unspecified: Secondary | ICD-10-CM | POA: Diagnosis present

## 2017-10-30 DIAGNOSIS — E871 Hypo-osmolality and hyponatremia: Secondary | ICD-10-CM | POA: Diagnosis not present

## 2017-10-30 DIAGNOSIS — N183 Chronic kidney disease, stage 3 (moderate): Secondary | ICD-10-CM | POA: Diagnosis not present

## 2017-10-30 DIAGNOSIS — R339 Retention of urine, unspecified: Secondary | ICD-10-CM | POA: Diagnosis not present

## 2017-10-30 DIAGNOSIS — W010XXA Fall on same level from slipping, tripping and stumbling without subsequent striking against object, initial encounter: Secondary | ICD-10-CM

## 2017-10-30 DIAGNOSIS — R5082 Postprocedural fever: Secondary | ICD-10-CM | POA: Diagnosis not present

## 2017-10-30 DIAGNOSIS — S0990XA Unspecified injury of head, initial encounter: Secondary | ICD-10-CM | POA: Diagnosis not present

## 2017-10-30 DIAGNOSIS — S3993XA Unspecified injury of pelvis, initial encounter: Secondary | ICD-10-CM | POA: Diagnosis not present

## 2017-10-30 DIAGNOSIS — D649 Anemia, unspecified: Secondary | ICD-10-CM | POA: Diagnosis present

## 2017-10-30 DIAGNOSIS — S8991XA Unspecified injury of right lower leg, initial encounter: Secondary | ICD-10-CM | POA: Diagnosis not present

## 2017-10-30 DIAGNOSIS — Z79899 Other long term (current) drug therapy: Secondary | ICD-10-CM | POA: Diagnosis not present

## 2017-10-30 DIAGNOSIS — J309 Allergic rhinitis, unspecified: Secondary | ICD-10-CM | POA: Diagnosis present

## 2017-10-30 DIAGNOSIS — Z888 Allergy status to other drugs, medicaments and biological substances status: Secondary | ICD-10-CM

## 2017-10-30 DIAGNOSIS — I959 Hypotension, unspecified: Secondary | ICD-10-CM | POA: Diagnosis not present

## 2017-10-30 DIAGNOSIS — S22080A Wedge compression fracture of T11-T12 vertebra, initial encounter for closed fracture: Secondary | ICD-10-CM

## 2017-10-30 DIAGNOSIS — M79661 Pain in right lower leg: Secondary | ICD-10-CM | POA: Diagnosis not present

## 2017-10-30 DIAGNOSIS — I129 Hypertensive chronic kidney disease with stage 1 through stage 4 chronic kidney disease, or unspecified chronic kidney disease: Secondary | ICD-10-CM | POA: Diagnosis present

## 2017-10-30 DIAGNOSIS — S0190XA Unspecified open wound of unspecified part of head, initial encounter: Secondary | ICD-10-CM | POA: Diagnosis not present

## 2017-10-30 DIAGNOSIS — K219 Gastro-esophageal reflux disease without esophagitis: Secondary | ICD-10-CM | POA: Diagnosis present

## 2017-10-30 DIAGNOSIS — S0101XA Laceration without foreign body of scalp, initial encounter: Secondary | ICD-10-CM | POA: Diagnosis present

## 2017-10-30 DIAGNOSIS — E785 Hyperlipidemia, unspecified: Secondary | ICD-10-CM | POA: Diagnosis not present

## 2017-10-30 DIAGNOSIS — Z8781 Personal history of (healed) traumatic fracture: Secondary | ICD-10-CM | POA: Diagnosis present

## 2017-10-30 DIAGNOSIS — M25551 Pain in right hip: Secondary | ICD-10-CM | POA: Diagnosis not present

## 2017-10-30 DIAGNOSIS — E559 Vitamin D deficiency, unspecified: Secondary | ICD-10-CM | POA: Diagnosis present

## 2017-10-30 DIAGNOSIS — W19XXXA Unspecified fall, initial encounter: Secondary | ICD-10-CM

## 2017-10-30 DIAGNOSIS — Z820 Family history of epilepsy and other diseases of the nervous system: Secondary | ICD-10-CM | POA: Diagnosis not present

## 2017-10-30 DIAGNOSIS — S72001A Fracture of unspecified part of neck of right femur, initial encounter for closed fracture: Secondary | ICD-10-CM | POA: Diagnosis not present

## 2017-10-30 DIAGNOSIS — Z471 Aftercare following joint replacement surgery: Secondary | ICD-10-CM | POA: Diagnosis not present

## 2017-10-30 DIAGNOSIS — S79911A Unspecified injury of right hip, initial encounter: Secondary | ICD-10-CM | POA: Diagnosis not present

## 2017-10-30 DIAGNOSIS — M12551 Traumatic arthropathy, right hip: Secondary | ICD-10-CM | POA: Diagnosis not present

## 2017-10-30 DIAGNOSIS — S32010A Wedge compression fracture of first lumbar vertebra, initial encounter for closed fracture: Secondary | ICD-10-CM

## 2017-10-30 DIAGNOSIS — S199XXA Unspecified injury of neck, initial encounter: Secondary | ICD-10-CM | POA: Diagnosis not present

## 2017-10-30 DIAGNOSIS — K759 Inflammatory liver disease, unspecified: Secondary | ICD-10-CM | POA: Diagnosis not present

## 2017-10-30 DIAGNOSIS — G3184 Mild cognitive impairment, so stated: Secondary | ICD-10-CM | POA: Diagnosis present

## 2017-10-30 LAB — CBC WITH DIFFERENTIAL/PLATELET
Basophils Absolute: 0 10*3/uL (ref 0.0–0.1)
Basophils Relative: 0 %
EOS ABS: 0.1 10*3/uL (ref 0.0–0.7)
EOS PCT: 1 %
HCT: 35.1 % — ABNORMAL LOW (ref 36.0–46.0)
HEMOGLOBIN: 11.3 g/dL — AB (ref 12.0–15.0)
Lymphocytes Relative: 8 %
Lymphs Abs: 1.2 10*3/uL (ref 0.7–4.0)
MCH: 28 pg (ref 26.0–34.0)
MCHC: 32.2 g/dL (ref 30.0–36.0)
MCV: 86.9 fL (ref 78.0–100.0)
MONOS PCT: 5 %
Monocytes Absolute: 0.7 10*3/uL (ref 0.1–1.0)
NEUTROS ABS: 12.4 10*3/uL — AB (ref 1.7–7.7)
NEUTROS PCT: 86 %
PLATELETS: 146 10*3/uL — AB (ref 150–400)
RBC: 4.04 MIL/uL (ref 3.87–5.11)
RDW: 13.8 % (ref 11.5–15.5)
WBC: 14.3 10*3/uL — AB (ref 4.0–10.5)

## 2017-10-30 LAB — CBC
HEMATOCRIT: 32 % — AB (ref 36.0–46.0)
Hemoglobin: 10.4 g/dL — ABNORMAL LOW (ref 12.0–15.0)
MCH: 28.1 pg (ref 26.0–34.0)
MCHC: 32.5 g/dL (ref 30.0–36.0)
MCV: 86.5 fL (ref 78.0–100.0)
PLATELETS: 133 10*3/uL — AB (ref 150–400)
RBC: 3.7 MIL/uL — ABNORMAL LOW (ref 3.87–5.11)
RDW: 13.8 % (ref 11.5–15.5)
WBC: 11.5 10*3/uL — ABNORMAL HIGH (ref 4.0–10.5)

## 2017-10-30 LAB — TYPE AND SCREEN
ABO/RH(D): AB POS
Antibody Screen: NEGATIVE

## 2017-10-30 LAB — PROTIME-INR
INR: 1.07
Prothrombin Time: 13.8 seconds (ref 11.4–15.2)

## 2017-10-30 LAB — HEPATIC FUNCTION PANEL
ALBUMIN: 3.3 g/dL — AB (ref 3.5–5.0)
ALK PHOS: 51 U/L (ref 38–126)
ALT: 18 U/L (ref 14–54)
AST: 23 U/L (ref 15–41)
BILIRUBIN TOTAL: 0.7 mg/dL (ref 0.3–1.2)
Bilirubin, Direct: 0.2 mg/dL (ref 0.1–0.5)
Indirect Bilirubin: 0.5 mg/dL (ref 0.3–0.9)
TOTAL PROTEIN: 5.8 g/dL — AB (ref 6.5–8.1)

## 2017-10-30 LAB — BASIC METABOLIC PANEL
Anion gap: 6 (ref 5–15)
BUN: 16 mg/dL (ref 6–20)
CHLORIDE: 105 mmol/L (ref 101–111)
CO2: 23 mmol/L (ref 22–32)
CREATININE: 0.96 mg/dL (ref 0.44–1.00)
Calcium: 8.6 mg/dL — ABNORMAL LOW (ref 8.9–10.3)
GFR calc Af Amer: 60 mL/min — ABNORMAL LOW (ref >60)
GFR calc non Af Amer: 52 mL/min — ABNORMAL LOW (ref >60)
Glucose, Bld: 111 mg/dL — ABNORMAL HIGH (ref 65–99)
Potassium: 3.6 mmol/L (ref 3.5–5.1)
SODIUM: 134 mmol/L — AB (ref 135–145)

## 2017-10-30 MED ORDER — MORPHINE SULFATE (PF) 4 MG/ML IV SOLN
2.0000 mg | INTRAVENOUS | Status: DC | PRN
  Administered 2017-10-30 – 2017-10-31 (×3): 2 mg via INTRAVENOUS
  Filled 2017-10-30 (×3): qty 1

## 2017-10-30 MED ORDER — FENTANYL CITRATE (PF) 100 MCG/2ML IJ SOLN
50.0000 ug | Freq: Once | INTRAMUSCULAR | Status: AC
  Administered 2017-10-30: 50 ug via INTRAVENOUS
  Filled 2017-10-30: qty 2

## 2017-10-30 MED ORDER — FERROUS SULFATE 325 (65 FE) MG PO TABS
325.0000 mg | ORAL_TABLET | Freq: Three times a day (TID) | ORAL | Status: DC
  Administered 2017-10-31 – 2017-11-04 (×11): 325 mg via ORAL
  Filled 2017-10-30 (×11): qty 1

## 2017-10-30 MED ORDER — LIDOCAINE HCL (PF) 1 % IJ SOLN
10.0000 mL | Freq: Once | INTRAMUSCULAR | Status: AC
  Administered 2017-10-30: 10 mL via INTRADERMAL
  Filled 2017-10-30: qty 10

## 2017-10-30 MED ORDER — DONEPEZIL HCL 5 MG PO TABS
5.0000 mg | ORAL_TABLET | Freq: Every day | ORAL | Status: DC
  Administered 2017-10-30 – 2017-11-03 (×5): 5 mg via ORAL
  Filled 2017-10-30 (×5): qty 1

## 2017-10-30 MED ORDER — HYDROCODONE-ACETAMINOPHEN 5-325 MG PO TABS: 1.0000 | ORAL_TABLET | Freq: Four times a day (QID) | ORAL | Status: DC | PRN

## 2017-10-30 MED ORDER — VITAMIN D 1000 UNITS PO TABS
1000.0000 [IU] | ORAL_TABLET | Freq: Every day | ORAL | Status: DC
  Administered 2017-10-31 – 2017-11-04 (×5): 1000 [IU] via ORAL
  Filled 2017-10-30 (×7): qty 1

## 2017-10-30 MED ORDER — SODIUM CHLORIDE 0.9 % IV BOLUS (SEPSIS)
500.0000 mL | Freq: Once | INTRAVENOUS | Status: AC
Start: 2017-10-30 — End: 2017-10-30
  Administered 2017-10-30: 500 mL via INTRAVENOUS

## 2017-10-30 MED ORDER — ZOLPIDEM TARTRATE 5 MG PO TABS: 5.0000 mg | ORAL_TABLET | Freq: Every evening | ORAL | Status: DC | PRN

## 2017-10-30 MED ORDER — DOCUSATE SODIUM 100 MG PO CAPS
100.0000 mg | ORAL_CAPSULE | Freq: Two times a day (BID) | ORAL | Status: DC
  Administered 2017-10-31 – 2017-11-04 (×9): 100 mg via ORAL
  Filled 2017-10-30 (×9): qty 1

## 2017-10-30 MED ORDER — SIMVASTATIN 20 MG PO TABS
20.0000 mg | ORAL_TABLET | Freq: Every day | ORAL | Status: DC
  Administered 2017-11-01 – 2017-11-03 (×3): 20 mg via ORAL
  Filled 2017-10-30 (×3): qty 1

## 2017-10-30 MED ORDER — ASPIRIN EC 325 MG PO TBEC: 325.0000 mg | DELAYED_RELEASE_TABLET | Freq: Every day | ORAL | Status: DC

## 2017-10-30 MED ORDER — HYDROMORPHONE HCL 1 MG/ML IJ SOLN
0.5000 mg | Freq: Once | INTRAMUSCULAR | Status: AC
  Administered 2017-10-30: 0.5 mg via INTRAVENOUS
  Filled 2017-10-30: qty 1

## 2017-10-30 MED ORDER — ONDANSETRON HCL 4 MG/2ML IJ SOLN
4.0000 mg | Freq: Once | INTRAMUSCULAR | Status: AC
  Administered 2017-10-30: 4 mg via INTRAVENOUS
  Filled 2017-10-30: qty 2

## 2017-10-30 MED ORDER — SODIUM CHLORIDE 0.9 % IV SOLN: INTRAVENOUS | Status: DC

## 2017-10-30 MED ORDER — POLYETHYLENE GLYCOL 3350 17 G PO PACK
17.0000 g | PACK | Freq: Every day | ORAL | Status: DC
  Administered 2017-10-31 – 2017-11-04 (×4): 17 g via ORAL
  Filled 2017-10-30 (×4): qty 1

## 2017-10-30 MED ORDER — CALCITONIN (SALMON) 200 UNIT/ACT NA SOLN
1.0000 | Freq: Every day | NASAL | Status: DC
  Administered 2017-11-02 – 2017-11-04 (×2): 1 via NASAL
  Filled 2017-10-30 (×3): qty 3.7

## 2017-10-30 MED ORDER — DEXTROSE 5 % IV SOLN
500.0000 mg | Freq: Three times a day (TID) | INTRAVENOUS | Status: DC | PRN
  Administered 2017-10-30: 500 mg via INTRAVENOUS
  Filled 2017-10-30 (×2): qty 5

## 2017-10-30 NOTE — Consult Note (Signed)
ORTHOPAEDIC CONSULTATION  REQUESTING PHYSICIAN: Lavina Hamman, MD  Chief Complaint: Right femoral neck hip fracture  HPI: Amanda Salas is a 83 y.o. female who presents with right femoral neck hip fracture s/p mechanical fall PTA.  The patient endorses severe pain in the right hip, that does not radiate, grinding in quality, worse with any movement, better with immobilization.  Denies LOC/fever/chills/nausea/vomiting.  Walks without assistive devices (walker, cane, wheelchair).  Does live independently.  Denies LOC, neck pain, abd pain.  Denies syncope.  Patient is quite active for her age.  She does have mild dementia.    Past Medical History:  Diagnosis Date  . Allergy   . Arthritis   . Cataract   . Disorder of bone and cartilage, unspecified   . GERD (gastroesophageal reflux disease)   . Hepatitis   . Hyperlipidemia   . Hypertension   . Thrombocytopenia, unspecified ([place])    Past Surgical History:  Procedure Laterality Date  . CARDIAC CATHETERIZATION    . ESOPHAGOGASTRODUODENOSCOPY     Social History   Socioeconomic History  . Marital status: Divorced    Spouse name: Not on file  . Number of children: 1  . Years of education: Not on file  . Highest education level: Not on file  Social Needs  . Financial resource strain: Not on file  . Food insecurity - worry: Not on file  . Food insecurity - inability: Not on file  . Transportation needs - medical: Not on file  . Transportation needs - non-medical: Not on file  Occupational History  . Not on file  Tobacco Use  . Smoking status: Never Smoker  . Smokeless tobacco: Never Used  Substance and Sexual Activity  . Alcohol use: No    Alcohol/week: 0.0 oz  . Drug use: No  . Sexual activity: No  Other Topics Concern  . Not on file  Social History Narrative   does word puzzles to keep memory sharp    enjoys reading   is active but no longer drives    Family History  Problem Relation Age of Onset  .  Alzheimer's disease Mother    Allergies  Allergen Reactions  . Ace Inhibitors     REACTION: cough  . Alendronate Sodium     REACTION: pill got stuck   Prior to Admission medications   Medication Sig Start Date End Date Taking? Authorizing Provider  acetaminophen (TYLENOL) 500 MG tablet Take 500 mg every 6 (six) hours as needed by mouth for mild pain or headache.   Yes [provider]  calcitonin, salmon, (MIACALCIN/FORTICAL) 200 UNIT/ACT nasal spray INHALE 1 SPRAY INTO ALTERNATE NOSTRILS DAILY 07/07/17  Yes Tower, Wynelle Fanny, MD  Cholecalciferol (VITAMIN D3 PO) Take 1,000 Units daily by mouth.    Yes [provider]  donepezil (ARICEPT) 5 MG tablet Take 5 mg at bedtime by mouth.   Yes [provider]  FLUZONE HIGH-DOSE 0.5 ML injection 1 each once. 10/18/17  Yes [provider]  losartan (COZAAR) 50 MG tablet Take 1 tablet (50 mg total) by mouth daily. 07/20/17  Yes Tower, Wynelle Fanny, MD  metoprolol succinate (TOPROL-XL) 50 MG 24 hr tablet TAKE 1 TABLET BY MOUTH DAILY WITH OR IMMEDIATELY FOLLOWING A MEAL 07/07/17  Yes Tower, Wynelle Fanny, MD  raloxifene (EVISTA) 60 MG tablet Take 1 tablet (60 mg total) by mouth daily. 09/20/17  Yes Tower, Wynelle Fanny, MD  simvastatin (ZOCOR) 20 MG tablet Take 1 tablet (20 mg  total) by mouth at bedtime. 07/07/17  Yes Tower, Wynelle Fanny, MD  donepezil (ARICEPT) 10 MG tablet Take 1 tablet (10 mg total) by mouth at bedtime. Patient not taking: Reported on 10/30/2017 10/05/17   Abner Greenspan, MD   Dg Tibia/fibula Right  Result Date: 10/30/2017 CLINICAL DATA:  Pain status post fall. EXAM: RIGHT TIBIA AND FIBULA - 2 VIEW COMPARISON:  None. FINDINGS: There is no evidence of fracture or other focal bone lesions. Mild osteoarthritic changes of the medial and patellofemoral compartment and moderate osteoarthritic changes of the lateral compartment of the knee joint. Soft tissues are unremarkable. IMPRESSION: No acute fracture or dislocation identified  about the right Knee. Three compartment osteoarthritic changes worst in the lateral compartment. Electronically Signed   By: Fidela Salisbury M.D.   On: 10/30/2017 16:36   Ct Head Wo Contrast  Result Date: 10/30/2017 CLINICAL DATA:  83 y/o F; status post fall with laceration on the right-sided of patient's head. EXAM: CT HEAD WITHOUT CONTRAST CT CERVICAL SPINE WITHOUT CONTRAST TECHNIQUE: Multidetector CT imaging of the head and cervical spine was performed following the standard protocol without intravenous contrast. Multiplanar CT image reconstructions of the cervical spine were also generated. COMPARISON:  None. FINDINGS: CT HEAD FINDINGS Brain: No evidence of acute infarction, hemorrhage, hydrocephalus, extra-axial collection or mass lesion/mass effect. Mild chronic microvascular ischemic changes and parenchymal volume loss of the brain. Vascular: Calcific atherosclerosis of carotid siphons. No hyperdense vessel identified. Skull: Right parietal region small scalp contusion and laceration. No calvarial fracture. Sinuses/Orbits: No acute finding. Other: Bilateral intra-ocular lens replacement. CT CERVICAL SPINE FINDINGS Alignment: Normal cervical lordosis.  Grade 1 C3-4 anterolisthesis. Skull base and vertebrae: No acute fracture. No primary bone lesion or focal pathologic process. Soft tissues and spinal canal: No prevertebral fluid or swelling. No visible canal hematoma. Disc levels: Cervical spondylosis with mild C3-4 and moderate severe C5-6 disc space narrowing. Mild left-greater-than-right facet hypertrophy. C5-6 disc osteophyte complex with mild canal stenosis. C5-6 uncovertebral and facet hypertrophy with left-greater-than-right foraminal encroachment. Upper chest: Negative. Other: Calcific atherosclerosis of carotid siphons. IMPRESSION: 1. Right parietal region small scalp contusion and laceration. 2. No calvarial fracture or acute intracranial abnormality identified. 3. Mild for age chronic  microvascular ischemic changes and mild parenchymal volume loss of the brain. 4. No acute fracture or dislocation of cervical spine. 5. Cervical spondylosis greatest at C5-6 level. Electronically Signed   By: Kristine Garbe M.D.   On: 10/30/2017 17:03   Ct Cervical Spine Wo Contrast  Result Date: 10/30/2017 CLINICAL DATA:  83 y/o F; status post fall with laceration on the right-sided of patient's head. EXAM: CT HEAD WITHOUT CONTRAST CT CERVICAL SPINE WITHOUT CONTRAST TECHNIQUE: Multidetector CT imaging of the head and cervical spine was performed following the standard protocol without intravenous contrast. Multiplanar CT image reconstructions of the cervical spine were also generated. COMPARISON:  None. FINDINGS: CT HEAD FINDINGS Brain: No evidence of acute infarction, hemorrhage, hydrocephalus, extra-axial collection or mass lesion/mass effect. Mild chronic microvascular ischemic changes and parenchymal volume loss of the brain. Vascular: Calcific atherosclerosis of carotid siphons. No hyperdense vessel identified. Skull: Right parietal region small scalp contusion and laceration. No calvarial fracture. Sinuses/Orbits: No acute finding. Other: Bilateral intra-ocular lens replacement. CT CERVICAL SPINE FINDINGS Alignment: Normal cervical lordosis.  Grade 1 C3-4 anterolisthesis. Skull base and vertebrae: No acute fracture. No primary bone lesion or focal pathologic process. Soft tissues and spinal canal: No prevertebral fluid or swelling. No visible canal hematoma. Disc levels: Cervical  spondylosis with mild C3-4 and moderate severe C5-6 disc space narrowing. Mild left-greater-than-right facet hypertrophy. C5-6 disc osteophyte complex with mild canal stenosis. C5-6 uncovertebral and facet hypertrophy with left-greater-than-right foraminal encroachment. Upper chest: Negative. Other: Calcific atherosclerosis of carotid siphons. IMPRESSION: 1. Right parietal region small scalp contusion and laceration.  2. No calvarial fracture or acute intracranial abnormality identified. 3. Mild for age chronic microvascular ischemic changes and mild parenchymal volume loss of the brain. 4. No acute fracture or dislocation of cervical spine. 5. Cervical spondylosis greatest at C5-6 level. Electronically Signed   By: Kristine Garbe M.D.   On: 10/30/2017 17:03   Dg Pelvis Portable  Result Date: 10/30/2017 CLINICAL DATA:  Status post fall with right hip pain. EXAM: PORTABLE PELVIS 1-2 VIEWS COMPARISON:  None. FINDINGS: There is a sub capitellar fracture of the right femoral neck with foreshortening and mild superior migration of the femur. Mild osteopenia. No evidence of other pelvic or left hip fractures. IMPRESSION: Right femoral neck sub capitellar impacted fracture. Electronically Signed   By: Fidela Salisbury M.D.   On: 10/30/2017 16:35   Dg Chest Portable 1 View  Result Date: 10/30/2017 CLINICAL DATA:  Painful RIGHT hip EXAM: PORTABLE CHEST 1 VIEW COMPARISON:  None. FINDINGS: Normal mediastinum and cardiac silhouette. Normal pulmonary vasculature. No evidence of effusion, infiltrate, or pneumothorax. No acute bony abnormality. IMPRESSION: No acute cardiopulmonary process. Electronically Signed   By: Suzy Bouchard M.D.   On: 10/30/2017 16:36   Dg Femur Port, Min 2 Views Right  Result Date: 10/30/2017 CLINICAL DATA:  Right hip pain post fall. EXAM: RIGHT FEMUR PORTABLE 2 VIEW COMPARISON:  None. FINDINGS: Sub capitellar mildly impacted right femoral neck fracture with mild superior migration of the right femur. Evidence of dislocation. Mild osteopenia. IMPRESSION: Sub capitellar mildly impacted right femoral neck fracture. Electronically Signed   By: Fidela Salisbury M.D.   On: 10/30/2017 16:37    All pertinent xrays, MRI, CT independently reviewed and interpreted  Positive ROS: All other systems have been reviewed and were otherwise negative with the exception of those mentioned in the HPI  and as above.  Physical Exam: General: Alert, no acute distress Cardiovascular: No pedal edema Respiratory: No cyanosis, no use of accessory musculature GI: No organomegaly, abdomen is soft and non-tender Skin: No lesions in the area of chief complaint Neurologic: Sensation intact distally Psychiatric: Patient is competent for consent with normal mood and affect Lymphatic: No axillary or cervical lymphadenopathy  MUSCULOSKELETAL:  - pain with movement of the hip and extremity - skin intact - NVI distally - compartments soft  Assessment: Right femoral neck fracture  Plan: - surgical intervention is indicated, patient and family are aware of r/b/a and wish to proceed - consent obtained - medical optimization per primary team - surgery is planned for Sunday morning - N.p.o. after midnight.  Thank you for the consult and the opportunity to see Ms. Poma  N. Eduard Roux, MD [place] 7:44 PM

## 2017-10-30 NOTE — ED Notes (Signed)
MD at bedside. 

## 2017-10-30 NOTE — ED Notes (Signed)
Nurse drawing labs. 

## 2017-10-30 NOTE — ED Notes (Signed)
Delay in lab draw,  Provider at bedside. 

## 2017-10-30 NOTE — H&P (Addendum)
[facility] History and Physical   Patient: Amanda Salas:[id number]   PCP: Abner Greenspan, MD DOB: 12-08-1927   DOA: 10/30/2017   DOS: 10/30/2017   DOS: the patient was seen and examined on 10/30/2017  Referring physician: dr Vanita Panda Chief Complaint: Fall  HPI: Amanda Salas is a 83 y.o. female with Past medical history of osteoporosis, HTN, GERD, HLD, mild dementia. Patient presented with a mechanical fall. Patient was working at her sink, when she tried to turn she lost her footing and fell on the ground on the right side.  She hit her head and had some bleeding.  She did not passed out denies having any dizziness lightheadedness was awake all throughout the episode. She denies any nausea vomiting no diarrhea no constipation no chest pain abdominal pain no shortness of breath before or after the fall. She probably has no late on the ground for almost 45 minutes before someone was able to help her. Does not take any blood thinners at her baseline, does not take any ibuprofen or Aleve or other NSAIDs. No recent change in medications reported other than Aricept.  No coronary revascularization/CVA within 5 years. No Recent stress test. Can Climb flight of stair, participates in recreational activity,does household chores. No Prior adverse event with anesthesia. No Alcohol use, drug use.  The patient is coming from home.  At her baseline ambulates without support. And is independent for most of her ADL; manages her medication on her own.  ED Course: Has right scalp laceration, repaired in ED. EDP discussed with [facility] on call-they will reportedly operate tomorrow. Patient dropped her blood pressure after receiving IV Dilaudid, responding to IV fluids and has some baseline confusion.  Review of Systems: as mentioned in the history of present illness.  All other systems reviewed, and are negative  Past Medical History:  Diagnosis Date  . Allergy   .  Arthritis   . Cataract   . Disorder of bone and cartilage, unspecified   . GERD (gastroesophageal reflux disease)   . Hepatitis   . Hyperlipidemia   . Hypertension   . Thrombocytopenia, unspecified ([place])    Past Surgical History:  Procedure Laterality Date  . CARDIAC CATHETERIZATION    . ESOPHAGOGASTRODUODENOSCOPY     Social History:  reports that  has never smoked. she has never used smokeless tobacco. She reports that she does not drink alcohol or use drugs.  Allergies  Allergen Reactions  . Ace Inhibitors     REACTION: cough  . Alendronate Sodium     REACTION: pill got stuck    Family History  Problem Relation Age of Onset  . Alzheimer's disease Mother     Prior to Admission medications   Medication Sig Start Date End Date Taking? Authorizing Provider  acetaminophen (TYLENOL) 500 MG tablet Take 500 mg every 6 (six) hours as needed by mouth for mild pain or headache.   Yes [provider]  calcitonin, salmon, (MIACALCIN/FORTICAL) 200 UNIT/ACT nasal spray INHALE 1 SPRAY INTO ALTERNATE NOSTRILS DAILY 07/07/17  Yes Tower, Wynelle Fanny, MD  Cholecalciferol (VITAMIN D3 PO) Take 1,000 Units daily by mouth.    Yes [provider]  donepezil (ARICEPT) 5 MG tablet Take 5 mg at bedtime by mouth.   Yes [provider]  FLUZONE HIGH-DOSE 0.5 ML injection 1 each once. 10/18/17  Yes [provider]  losartan (COZAAR) 50 MG tablet Take 1 tablet (50 mg total) by mouth daily. 07/20/17  Yes Tower,  Wynelle Fanny, MD  metoprolol succinate (TOPROL-XL) 50 MG 24 hr tablet TAKE 1 TABLET BY MOUTH DAILY WITH OR IMMEDIATELY FOLLOWING A MEAL 07/07/17  Yes Tower, Wynelle Fanny, MD  raloxifene (EVISTA) 60 MG tablet Take 1 tablet (60 mg total) by mouth daily. 09/20/17  Yes Tower, Wynelle Fanny, MD  simvastatin (ZOCOR) 20 MG tablet Take 1 tablet (20 mg total) by mouth at bedtime. 07/07/17  Yes Tower, Wynelle Fanny, MD  donepezil (ARICEPT) 10 MG tablet Take 1 tablet (10 mg total) by mouth at  bedtime. Patient not taking: Reported on 10/30/2017 10/05/17   Abner Greenspan, MD    Physical Exam: Vitals:   10/30/17 1515 10/30/17 1753 10/30/17 1754 10/30/17 1800  BP: 131/87   (!) 93/52  Pulse: 65  (!) 101 (!) 111  Resp:   18   Temp:      TempSrc:      SpO2: 100%  96% 95%  Weight:  56.7 kg (125 lb)    Height:  5\' 3"  (1.6 m)      General: Alert, Awake and Oriented to Time, Place and Person. Appear in moderate distress, affect appropriate Eyes: PERRL, Conjunctiva normal ENT: Oral Mucosa clear moist. Neck: no JVD, no Abnormal Mass Or lumps Cardiovascular: S1 and S2 Present, no Murmur, Peripheral Pulses Present Respiratory: normal respiratory effort, Bilateral Air entry equal and Decreased, no use of accessory muscle, Clear to Auscultation, no Crackles, no wheezes Abdomen: Bowel Sound present, Soft and no tenderness, no hernia Skin: no redness, no Rash, no induration Extremities: no Pedal edema, no calf tenderness Neurologic: Grossly no focal neuro deficit. Bilaterally Equal motor strength  Labs on Admission:  CBC: Recent Labs  Lab 10/30/17 1534  WBC 14.3*  NEUTROABS 12.4*  HGB 11.3*  HCT 35.1*  MCV 86.9  PLT 146*    CMP     Component Value Date/Time   NA 134 (L) 10/30/2017 1534   K 3.6 10/30/2017 1534   CL 105 10/30/2017 1534   CO2 23 10/30/2017 1534   GLUCOSE 111 (H) 10/30/2017 1534   BUN 16 10/30/2017 1534   CREATININE 0.96 10/30/2017 1534   CREATININE 0.86 07/07/2017 1722   CALCIUM 8.6 (L) 10/30/2017 1534   PROT 7.0 07/07/2017 1722   ALBUMIN 4.1 07/07/2017 1722   AST 19 07/07/2017 1722   ALT 11 07/07/2017 1722   ALKPHOS 59 07/07/2017 1722   BILITOT 0.3 07/07/2017 1722   GFRNONAA 52 (L) 10/30/2017 1534   GFRAA 60 (L) 10/30/2017 1534    No results for input(s): CKTOTAL, CKMB, CKMBINDEX, TROPONINI in the last 168 hours. BNP (last 3 results) No results for input(s): BNP in the last 8760 hours.  ProBNP (last 3 results) No results for input(s): PROBNP  in the last 8760 hours.   Radiological Exams on Admission: Dg Tibia/fibula Right  Result Date: 10/30/2017 CLINICAL DATA:  Pain status post fall. EXAM: RIGHT TIBIA AND FIBULA - 2 VIEW COMPARISON:  None. FINDINGS: There is no evidence of fracture or other focal bone lesions. Mild osteoarthritic changes of the medial and patellofemoral compartment and moderate osteoarthritic changes of the lateral compartment of the knee joint. Soft tissues are unremarkable. IMPRESSION: No acute fracture or dislocation identified about the right Knee. Three compartment osteoarthritic changes worst in the lateral compartment. Electronically Signed   By: Fidela Salisbury M.D.   On: 10/30/2017 16:36   Ct Head Wo Contrast  Result Date: 10/30/2017 CLINICAL DATA:  83 y/o F; status post fall with laceration on the  right-sided of patient's head. EXAM: CT HEAD WITHOUT CONTRAST CT CERVICAL SPINE WITHOUT CONTRAST TECHNIQUE: Multidetector CT imaging of the head and cervical spine was performed following the standard protocol without intravenous contrast. Multiplanar CT image reconstructions of the cervical spine were also generated. COMPARISON:  None. FINDINGS: CT HEAD FINDINGS Brain: No evidence of acute infarction, hemorrhage, hydrocephalus, extra-axial collection or mass lesion/mass effect. Mild chronic microvascular ischemic changes and parenchymal volume loss of the brain. Vascular: Calcific atherosclerosis of carotid siphons. No hyperdense vessel identified. Skull: Right parietal region small scalp contusion and laceration. No calvarial fracture. Sinuses/Orbits: No acute finding. Other: Bilateral intra-ocular lens replacement. CT CERVICAL SPINE FINDINGS Alignment: Normal cervical lordosis.  Grade 1 C3-4 anterolisthesis. Skull base and vertebrae: No acute fracture. No primary bone lesion or focal pathologic process. Soft tissues and spinal canal: No prevertebral fluid or swelling. No visible canal hematoma. Disc levels:  Cervical spondylosis with mild C3-4 and moderate severe C5-6 disc space narrowing. Mild left-greater-than-right facet hypertrophy. C5-6 disc osteophyte complex with mild canal stenosis. C5-6 uncovertebral and facet hypertrophy with left-greater-than-right foraminal encroachment. Upper chest: Negative. Other: Calcific atherosclerosis of carotid siphons. IMPRESSION: 1. Right parietal region small scalp contusion and laceration. 2. No calvarial fracture or acute intracranial abnormality identified. 3. Mild for age chronic microvascular ischemic changes and mild parenchymal volume loss of the brain. 4. No acute fracture or dislocation of cervical spine. 5. Cervical spondylosis greatest at C5-6 level. Electronically Signed   By: Kristine Garbe M.D.   On: 10/30/2017 17:03   Ct Cervical Spine Wo Contrast  Result Date: 10/30/2017 CLINICAL DATA:  83 y/o F; status post fall with laceration on the right-sided of patient's head. EXAM: CT HEAD WITHOUT CONTRAST CT CERVICAL SPINE WITHOUT CONTRAST TECHNIQUE: Multidetector CT imaging of the head and cervical spine was performed following the standard protocol without intravenous contrast. Multiplanar CT image reconstructions of the cervical spine were also generated. COMPARISON:  None. FINDINGS: CT HEAD FINDINGS Brain: No evidence of acute infarction, hemorrhage, hydrocephalus, extra-axial collection or mass lesion/mass effect. Mild chronic microvascular ischemic changes and parenchymal volume loss of the brain. Vascular: Calcific atherosclerosis of carotid siphons. No hyperdense vessel identified. Skull: Right parietal region small scalp contusion and laceration. No calvarial fracture. Sinuses/Orbits: No acute finding. Other: Bilateral intra-ocular lens replacement. CT CERVICAL SPINE FINDINGS Alignment: Normal cervical lordosis.  Grade 1 C3-4 anterolisthesis. Skull base and vertebrae: No acute fracture. No primary bone lesion or focal pathologic process. Soft tissues  and spinal canal: No prevertebral fluid or swelling. No visible canal hematoma. Disc levels: Cervical spondylosis with mild C3-4 and moderate severe C5-6 disc space narrowing. Mild left-greater-than-right facet hypertrophy. C5-6 disc osteophyte complex with mild canal stenosis. C5-6 uncovertebral and facet hypertrophy with left-greater-than-right foraminal encroachment. Upper chest: Negative. Other: Calcific atherosclerosis of carotid siphons. IMPRESSION: 1. Right parietal region small scalp contusion and laceration. 2. No calvarial fracture or acute intracranial abnormality identified. 3. Mild for age chronic microvascular ischemic changes and mild parenchymal volume loss of the brain. 4. No acute fracture or dislocation of cervical spine. 5. Cervical spondylosis greatest at C5-6 level. Electronically Signed   By: Kristine Garbe M.D.   On: 10/30/2017 17:03   Dg Pelvis Portable  Result Date: 10/30/2017 CLINICAL DATA:  Status post fall with right hip pain. EXAM: PORTABLE PELVIS 1-2 VIEWS COMPARISON:  None. FINDINGS: There is a sub capitellar fracture of the right femoral neck with foreshortening and mild superior migration of the femur. Mild osteopenia. No evidence of other pelvic or  left hip fractures. IMPRESSION: Right femoral neck sub capitellar impacted fracture. Electronically Signed   By: Fidela Salisbury M.D.   On: 10/30/2017 16:35   Dg Chest Portable 1 View  Result Date: 10/30/2017 CLINICAL DATA:  Painful RIGHT hip EXAM: PORTABLE CHEST 1 VIEW COMPARISON:  None. FINDINGS: Normal mediastinum and cardiac silhouette. Normal pulmonary vasculature. No evidence of effusion, infiltrate, or pneumothorax. No acute bony abnormality. IMPRESSION: No acute cardiopulmonary process. Electronically Signed   By: Suzy Bouchard M.D.   On: 10/30/2017 16:36   Dg Femur Port, Min 2 Views Right  Result Date: 10/30/2017 CLINICAL DATA:  Right hip pain post fall. EXAM: RIGHT FEMUR PORTABLE 2 VIEW  COMPARISON:  None. FINDINGS: Sub capitellar mildly impacted right femoral neck fracture with mild superior migration of the right femur. Evidence of dislocation. Mild osteopenia. IMPRESSION: Sub capitellar mildly impacted right femoral neck fracture. Electronically Signed   By: Fidela Salisbury M.D.   On: 10/30/2017 16:37    Assessment/Plan 1.  Right closed subcapital hip fracture. Initial encounter. Acute after a mechanical fall. [facility] consulted. Patient will remain n.p.o. after midnight for possible procedure tomorrow. Anticoagulation per orthopedic, SCD overnight. Patient appears to sensitive to fentanyl and Dilaudid. Will use as needed Norco and morphine for pain control. Robaxin for muscle cramps. PT OT postoperatively. Family would like to take the patient home if possible postoperatively.  2. Preoperative medical evaluation A) Cardiac risk: Based on RCRI does not have any significant risk factor and thus the patient is a low to moderate risk for adverse Cardiac outcome from surgery just because of her age. Recommend further work up with EKG.  Bleeding risk The pt will be place on SCD and aspirin for DVT prophylaxis to minimize the bleeding risk. Will request Surgeon to please Order Lovenox/DVT prophylaxis of his/her choice when OK from Surgeon's standpoint post op.  3.  Mild dementia. At risk for delirium. Patient is at risk for postoperative delirium.  As well as delirium from narcotics. Need to monitor closely for now and minimize narcotics as much as possible. Family is concerned about the same and would like to discuss with orthopedics regarding possibility of spinal anesthesia.  4.  Acute hypotensive episode. Mostly in response to IV narcotics. Check CBC and type and screen. Avoid aggressive IV narcotic use. Continue normal saline fluid infusion as well as give normal saline 500 cc bolus.  5.  History of hypertension. Currently holding blood  pressure medication in response to hypotension.  6.  Osteoporosis. Prior history of compression fracture. CT head CT spine shows no acute fracture. Patient on droloxifene at home currently on hold. Continue calcitriol.  7.  Mechanical fall. PT OT postoperatively. CT head and CT C-spine unremarkable for any acute fracture. Patient has sustained scalp laceration which will be repaired by EDP. X-ray tibia-fibula, x-ray pelvis shows no evidence of acute fracture on the right side. X-ray chest no evidence of rib fracture.  No acute abnormality as well. No focal deficit on examination to suggest any acute intracranial abnormality.  8.  Mild hyponatremia. Continue IV fluid.  9.  Mild hypocalcemia. Check albumin until.  Corrected calcium.  10.  Leukocytosis. Likely reaction to stress.  11.  Chronic anemia. Baseline hemoglobin 11.5. With multiple IV fluid bolus patient is at risk for dilutional anemia. Will monitor.  12.  Chronic thrombocytopenia. Platelet count 146.  No further workup at present.  13.  Hyperlipidemia. Continue simvastatin.  14.  History of compression fracture of T12, L1,  L2, L3, L4.  Seen in 2014. No focal deficit in the leg. Monitor.  Nutrition: Regular diet with aspiration precaution right now.  N.p.o. after midnight. DVT Prophylaxis: mechanical compression device  Advance goals of care discussion: Full code perioperatively.  Per daughter she would not like the patient to be a vegetable but would like to remain full code for now.  Consults: [facility]  Family Communication: family was present at bedside, at the time of interview.  Opportunity was given to ask question and all questions were answered satisfactorily.  Disposition: Admitted as inpatient, telemetry unit.  Author: Berle Mull, MD [facility] Pager: [phone] 10/30/2017  If 7PM-7AM, please contact night-coverage [email or web address] Password [email or web address]

## 2017-10-30 NOTE — ED Triage Notes (Signed)
Pt brought in by [facility] for a fall that occurred at home today. Pt was at the sink when she heard a pop and fell to the floor. Pt states she is not on any blood thinners. Pt is complaining of right hip pain. Laceration noted on the right of patient's head, bleeding is controlled.

## 2017-10-30 NOTE — ED Notes (Signed)
Delay in lab draw, provider at bedside at this time

## 2017-10-30 NOTE — ED Provider Notes (Signed)
[facility] EMERGENCY DEPARTMENT Provider Note   CSN: [id number] Arrival date & time: 10/30/17  1437 History   Chief Complaint Chief Complaint  Patient presents with  . Fall  . Hip Pain   HPI Amanda Salas is a 83 y.o. female.  The history is provided by the patient.  Fall  This is a new problem. The current episode started 1 to 2 hours ago. The problem has not changed since onset.Pertinent negatives include no chest pain, no abdominal pain and no shortness of breath. The symptoms are aggravated by intercourse. Nothing relieves the symptoms. She has tried nothing for the symptoms.  The patient states she was at the kitchen sink and turned to walk away when she lost her footing and fell landing on her right hip and hitting the right side of her head. She complains of pain in the right hip and leg.   Past Medical History:  Diagnosis Date  . Allergy   . Arthritis   . Cataract   . Disorder of bone and cartilage, unspecified   . GERD (gastroesophageal reflux disease)   . Hepatitis   . Hyperlipidemia   . Hypertension   . Thrombocytopenia, unspecified [facility])    Patient Active Problem List   Diagnosis Date Noted  . Skin lesion 05/27/2016  . Dystrophic nail 04/23/2015  . Encounter for Medicare annual wellness exam 04/17/2014  . Colon cancer screening 04/17/2014  . Anemia 04/17/2014  . Compression fracture of thoracolumbar vertebra ([place]) 12/26/2013  . Low back pain 11/22/2013  . Contact dermatitis 11/04/2011  . 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 Vitamin D deficiency 07/01/2010  . FALL, HX OF 07/01/2010  . Hyperlipidemia 08/10/2008  . THROMBOCYTOPENIA, CHRONIC 08/10/2008  . Essential hypertension 08/10/2008  . ALLERGIC RHINITIS 08/10/2008  . GERD 08/10/2008  . Osteoporosis 08/10/2008   Past Surgical History:  Procedure Laterality Date  . CARDIAC CATHETERIZATION    . ESOPHAGOGASTRODUODENOSCOPY     OB History    No data  available     Home Medications    Prior to Admission medications   Medication Sig Start Date End Date Taking? Authorizing Provider  calcitonin, salmon, (MIACALCIN/FORTICAL) 200 UNIT/ACT nasal spray INHALE 1 SPRAY INTO ALTERNATE NOSTRILS DAILY 07/07/17   Tower, Wynelle Fanny, MD  Cholecalciferol (VITAMIN D3 PO) Take 4,000 Units by mouth.    [provider]  donepezil (ARICEPT) 10 MG tablet Take 1 tablet (10 mg total) by mouth at bedtime. 10/05/17   Tower, Wynelle Fanny, MD  losartan (COZAAR) 50 MG tablet Take 1 tablet (50 mg total) by mouth daily. 07/20/17   Tower, Wynelle Fanny, MD  metoprolol succinate (TOPROL-XL) 50 MG 24 hr tablet TAKE 1 TABLET BY MOUTH DAILY WITH OR IMMEDIATELY FOLLOWING A MEAL 07/07/17   Tower, Wynelle Fanny, MD  raloxifene (EVISTA) 60 MG tablet Take 1 tablet (60 mg total) by mouth daily. 09/20/17   Tower, Wynelle Fanny, MD  simvastatin (ZOCOR) 20 MG tablet Take 1 tablet (20 mg total) by mouth at bedtime. 07/07/17   Tower, Wynelle Fanny, MD   Family History Family History  Problem Relation Age of Onset  . Alzheimer's disease Mother    Social History Social History   Tobacco Use  . Smoking status: Never Smoker  . Smokeless tobacco: Never Used  Substance Use Topics  . Alcohol use: No    Alcohol/week: 0.0 oz  . Drug use: No   Allergies   Ace inhibitors and Alendronate sodium  Review of Systems Review of Systems  Constitutional: Negative for chills and fever.  HENT: Negative for ear pain and sore throat.   Eyes: Negative for pain and visual disturbance.  Respiratory: Negative for cough and shortness of breath.   Cardiovascular: Negative for chest pain and palpitations.  Gastrointestinal: Negative for abdominal pain and vomiting.  Genitourinary: Negative for dysuria and hematuria.  Musculoskeletal: Negative for arthralgias and back pain.  Skin: Negative for color change and rash.  Neurological: Negative for seizures and syncope.  All other systems reviewed and are negative.  Physical  Exam Updated Vital Signs BP (!) 132/54 (BP Location: Left Arm)   Pulse 67   Temp (!) 97.5 F (36.4 C) (Oral)   Resp 16   SpO2 99%   Physical Exam  Constitutional: She appears well-developed and well-nourished. No distress.  HENT:  Head: Normocephalic and atraumatic.  Eyes: Conjunctivae are normal.  Neck: Neck supple.  Cardiovascular: Normal rate and regular rhythm.  No murmur heard. Pulmonary/Chest: Effort normal and breath sounds normal. No respiratory distress.  Abdominal: Soft. There is no tenderness.  Musculoskeletal: She exhibits no edema.       Right hip: She exhibits decreased range of motion, tenderness, bony tenderness and deformity.  Neurological: She is alert.  Skin: Skin is warm and dry. Laceration (2.5 cm laceration over rigth parietal scalp with no active bleeding) noted.  Psychiatric: She has a normal mood and affect.  Nursing note and vitals reviewed.  ED Treatments / Results  Labs (all labs ordered are listed, but only abnormal results are displayed) Labs Reviewed  CBC WITH DIFFERENTIAL/PLATELET - Abnormal; Notable for the following components:      Result Value   WBC 14.3 (*)    Hemoglobin 11.3 (*)    HCT 35.1 (*)    Platelets 146 (*)    Neutro Abs 12.4 (*)    All other components within normal limits  BASIC METABOLIC PANEL - Abnormal; Notable for the following components:   Sodium 134 (*)    Glucose, Bld 111 (*)    Calcium 8.6 (*)    GFR calc non Af Amer 52 (*)    GFR calc Af Amer 60 (*)    All other components within normal limits     EKG  EKG Interpretation None      Radiology Dg Tibia/fibula Right  Result Date: 10/30/2017 CLINICAL DATA:  Pain status post fall. EXAM: RIGHT TIBIA AND FIBULA - 2 VIEW COMPARISON:  None. FINDINGS: There is no evidence of fracture or other focal bone lesions. Mild osteoarthritic changes of the medial and patellofemoral compartment and moderate osteoarthritic changes of the lateral compartment of the knee joint.  Soft tissues are unremarkable. IMPRESSION: No acute fracture or dislocation identified about the right Knee. Three compartment osteoarthritic changes worst in the lateral compartment. Electronically Signed   By: Fidela Salisbury M.D.   On: 10/30/2017 16:36   Ct Head Wo Contrast  Result Date: 10/30/2017 CLINICAL DATA:  83 y/o F; status post fall with laceration on the right-sided of patient's head. EXAM: CT HEAD WITHOUT CONTRAST CT CERVICAL SPINE WITHOUT CONTRAST TECHNIQUE: Multidetector CT imaging of the head and cervical spine was performed following the standard protocol without intravenous contrast. Multiplanar CT image reconstructions of the cervical spine were also generated. COMPARISON:  None. FINDINGS: CT HEAD FINDINGS Brain: No evidence of acute infarction, hemorrhage, hydrocephalus, extra-axial collection or mass lesion/mass effect. Mild chronic microvascular ischemic changes and parenchymal volume loss of the brain. Vascular: Calcific  atherosclerosis of carotid siphons. No hyperdense vessel identified. Skull: Right parietal region small scalp contusion and laceration. No calvarial fracture. Sinuses/Orbits: No acute finding. Other: Bilateral intra-ocular lens replacement. CT CERVICAL SPINE FINDINGS Alignment: Normal cervical lordosis.  Grade 1 C3-4 anterolisthesis. Skull base and vertebrae: No acute fracture. No primary bone lesion or focal pathologic process. Soft tissues and spinal canal: No prevertebral fluid or swelling. No visible canal hematoma. Disc levels: Cervical spondylosis with mild C3-4 and moderate severe C5-6 disc space narrowing. Mild left-greater-than-right facet hypertrophy. C5-6 disc osteophyte complex with mild canal stenosis. C5-6 uncovertebral and facet hypertrophy with left-greater-than-right foraminal encroachment. Upper chest: Negative. Other: Calcific atherosclerosis of carotid siphons. IMPRESSION: 1. Right parietal region small scalp contusion and laceration. 2. No  calvarial fracture or acute intracranial abnormality identified. 3. Mild for age chronic microvascular ischemic changes and mild parenchymal volume loss of the brain. 4. No acute fracture or dislocation of cervical spine. 5. Cervical spondylosis greatest at C5-6 level. Electronically Signed   By: Kristine Garbe M.D.   On: 10/30/2017 17:03   Ct Cervical Spine Wo Contrast  Result Date: 10/30/2017 CLINICAL DATA:  83 y/o F; status post fall with laceration on the right-sided of patient's head. EXAM: CT HEAD WITHOUT CONTRAST CT CERVICAL SPINE WITHOUT CONTRAST TECHNIQUE: Multidetector CT imaging of the head and cervical spine was performed following the standard protocol without intravenous contrast. Multiplanar CT image reconstructions of the cervical spine were also generated. COMPARISON:  None. FINDINGS: CT HEAD FINDINGS Brain: No evidence of acute infarction, hemorrhage, hydrocephalus, extra-axial collection or mass lesion/mass effect. Mild chronic microvascular ischemic changes and parenchymal volume loss of the brain. Vascular: Calcific atherosclerosis of carotid siphons. No hyperdense vessel identified. Skull: Right parietal region small scalp contusion and laceration. No calvarial fracture. Sinuses/Orbits: No acute finding. Other: Bilateral intra-ocular lens replacement. CT CERVICAL SPINE FINDINGS Alignment: Normal cervical lordosis.  Grade 1 C3-4 anterolisthesis. Skull base and vertebrae: No acute fracture. No primary bone lesion or focal pathologic process. Soft tissues and spinal canal: No prevertebral fluid or swelling. No visible canal hematoma. Disc levels: Cervical spondylosis with mild C3-4 and moderate severe C5-6 disc space narrowing. Mild left-greater-than-right facet hypertrophy. C5-6 disc osteophyte complex with mild canal stenosis. C5-6 uncovertebral and facet hypertrophy with left-greater-than-right foraminal encroachment. Upper chest: Negative. Other: Calcific atherosclerosis of  carotid siphons. IMPRESSION: 1. Right parietal region small scalp contusion and laceration. 2. No calvarial fracture or acute intracranial abnormality identified. 3. Mild for age chronic microvascular ischemic changes and mild parenchymal volume loss of the brain. 4. No acute fracture or dislocation of cervical spine. 5. Cervical spondylosis greatest at C5-6 level. Electronically Signed   By: Kristine Garbe M.D.   On: 10/30/2017 17:03   Dg Pelvis Portable  Result Date: 10/30/2017 CLINICAL DATA:  Status post fall with right hip pain. EXAM: PORTABLE PELVIS 1-2 VIEWS COMPARISON:  None. FINDINGS: There is a sub capitellar fracture of the right femoral neck with foreshortening and mild superior migration of the femur. Mild osteopenia. No evidence of other pelvic or left hip fractures. IMPRESSION: Right femoral neck sub capitellar impacted fracture. Electronically Signed   By: Fidela Salisbury M.D.   On: 10/30/2017 16:35   Dg Chest Portable 1 View  Result Date: 10/30/2017 CLINICAL DATA:  Painful RIGHT hip EXAM: PORTABLE CHEST 1 VIEW COMPARISON:  None. FINDINGS: Normal mediastinum and cardiac silhouette. Normal pulmonary vasculature. No evidence of effusion, infiltrate, or pneumothorax. No acute bony abnormality. IMPRESSION: No acute cardiopulmonary process. Electronically Signed  By: Suzy Bouchard M.D.   On: 10/30/2017 16:36   Dg Femur Port, Min 2 Views Right  Result Date: 10/30/2017 CLINICAL DATA:  Right hip pain post fall. EXAM: RIGHT FEMUR PORTABLE 2 VIEW COMPARISON:  None. FINDINGS: Sub capitellar mildly impacted right femoral neck fracture with mild superior migration of the right femur. Evidence of dislocation. Mild osteopenia. IMPRESSION: Sub capitellar mildly impacted right femoral neck fracture. Electronically Signed   By: Fidela Salisbury M.D.   On: 10/30/2017 16:37    Procedures .Marland KitchenLaceration Repair Date/Time: 11/01/2017 2:16 AM Performed by: Fenton Foy, MD Authorized  by: Fenton Foy, MD   Consent:    Consent obtained:  Verbal   Consent given by:  Patient   Risks discussed:  Infection Anesthesia (see MAR for exact dosages):    Anesthesia method:  Local infiltration   Local anesthetic:  Lidocaine 1% WITH epi Laceration details:    Location:  Scalp   Scalp location:  R parietal   Length (cm):  2.5   Depth (mm):  4 Repair type:    Repair type:  Simple Pre-procedure details:    Preparation:  Imaging obtained to evaluate for foreign bodies Exploration:    Hemostasis achieved with:  Direct pressure   Wound exploration: wound explored through full range of motion and entire depth of wound probed and visualized     Contaminated: no   Treatment:    Area cleansed with:  Saline   Amount of cleaning:  Standard   Irrigation solution:  Sterile saline   Irrigation volume:  200   Irrigation method:  Syringe   Visualized foreign bodies/material removed: no   Skin repair:    Repair method:  Staples   Number of staples:  4 Approximation:    Approximation:  Close Post-procedure details:    Dressing:  Antibiotic ointment   Patient tolerance of procedure:  Tolerated well, no immediate complications   (including critical care time)  Medications Ordered in ED Medications - No data to display  Initial Impression / Assessment and Plan / ED Course  I have reviewed the triage vital signs and the nursing notes.  Pertinent labs & imaging results that were available during my care of the patient were reviewed by me and considered in my medical decision making (see chart for details).  Pt is a 83 y.o. female with pertinent PMHX HTN who presents after a mechanical fall from standing.   On my assessment the patient is alert and oriented x 3. She is hemodynamically stable. Large amount of dried blood in her hair, no active bleeding present. She is not on anticoagulation   CT head and C-spine ordered XR pelvis and right hip Pertinent findings included: right hip  fracture  Basic labs performed: abnormal results above  Patient treated with IV pain medication  Consulted orthopedics  The patient was admitted to medicine   The plan for this patient was discussed with Dr. Vanita Panda, who voiced agreement and who oversaw evaluation and treatment of this patient.   Final Clinical Impressions(s) / ED Diagnoses   Final diagnoses:  Fall  Hip fracture, right [facility])  History of hip replacement Laceration of scalp   ED Discharge Orders    None       Fenton Foy, MD 11/01/17 Georgiann Hahn, MD 11/03/17 2140

## 2017-10-31 ENCOUNTER — Inpatient Hospital Stay (HOSPITAL_COMMUNITY): Payer: Medicare HMO

## 2017-10-31 ENCOUNTER — Encounter (HOSPITAL_COMMUNITY)
Admission: EM | Disposition: A | Discharge status: Home Health | Payer: Self-pay | Source: Home / Self Care | Attending: Internal Medicine

## 2017-10-31 ENCOUNTER — Inpatient Hospital Stay (HOSPITAL_COMMUNITY): Payer: Medicare HMO | Admitting: Critical Care Medicine

## 2017-10-31 ENCOUNTER — Encounter (HOSPITAL_COMMUNITY): Payer: Self-pay | Admitting: Critical Care Medicine

## 2017-10-31 DIAGNOSIS — M12551 Traumatic arthropathy, right hip: Secondary | ICD-10-CM

## 2017-10-31 DIAGNOSIS — S72001A Fracture of unspecified part of neck of right femur, initial encounter for closed fracture: Secondary | ICD-10-CM

## 2017-10-31 DIAGNOSIS — S72011A Unspecified intracapsular fracture of right femur, initial encounter for closed fracture: Secondary | ICD-10-CM

## 2017-10-31 HISTORY — PX: TOTAL HIP ARTHROPLASTY: SHX124

## 2017-10-31 LAB — CBC WITH DIFFERENTIAL/PLATELET
Basophils Absolute: 0 10*3/uL (ref 0.0–0.1)
Basophils Relative: 0 %
Eosinophils Absolute: 0.1 10*3/uL (ref 0.0–0.7)
Eosinophils Relative: 1 %
HEMATOCRIT: 28.8 % — AB (ref 36.0–46.0)
HEMOGLOBIN: 9.4 g/dL — AB (ref 12.0–15.0)
LYMPHS ABS: 1.5 10*3/uL (ref 0.7–4.0)
LYMPHS PCT: 19 %
MCH: 28.1 pg (ref 26.0–34.0)
MCHC: 32.6 g/dL (ref 30.0–36.0)
MCV: 86 fL (ref 78.0–100.0)
MONOS PCT: 8 %
Monocytes Absolute: 0.6 10*3/uL (ref 0.1–1.0)
NEUTROS ABS: 5.8 10*3/uL (ref 1.7–7.7)
NEUTROS PCT: 72 %
Platelets: 123 10*3/uL — ABNORMAL LOW (ref 150–400)
RBC: 3.35 MIL/uL — ABNORMAL LOW (ref 3.87–5.11)
RDW: 14.1 % (ref 11.5–15.5)
WBC: 8 10*3/uL (ref 4.0–10.5)

## 2017-10-31 LAB — COMPREHENSIVE METABOLIC PANEL
ALK PHOS: 42 U/L (ref 38–126)
ALT: 14 U/L (ref 14–54)
ANION GAP: 7 (ref 5–15)
AST: 21 U/L (ref 15–41)
Albumin: 2.8 g/dL — ABNORMAL LOW (ref 3.5–5.0)
BILIRUBIN TOTAL: 0.7 mg/dL (ref 0.3–1.2)
BUN: 14 mg/dL (ref 6–20)
CALCIUM: 7.9 mg/dL — AB (ref 8.9–10.3)
CO2: 21 mmol/L — AB (ref 22–32)
CREATININE: 1 mg/dL (ref 0.44–1.00)
Chloride: 108 mmol/L (ref 101–111)
GFR calc non Af Amer: 49 mL/min — ABNORMAL LOW (ref >60)
GFR, EST AFRICAN AMERICAN: 57 mL/min — AB (ref >60)
GLUCOSE: 103 mg/dL — AB (ref 65–99)
Potassium: 3.8 mmol/L (ref 3.5–5.1)
Sodium: 136 mmol/L (ref 135–145)
Total Protein: 5.4 g/dL — ABNORMAL LOW (ref 6.5–8.1)

## 2017-10-31 LAB — SURGICAL PCR SCREEN
MRSA, PCR: NEGATIVE
Staphylococcus aureus: NEGATIVE

## 2017-10-31 LAB — MAGNESIUM: Magnesium: 1.8 mg/dL (ref 1.7–2.4)

## 2017-10-31 LAB — ABO/RH: ABO/RH(D): AB POS

## 2017-10-31 SURGERY — ARTHROPLASTY, HIP, TOTAL, ANTERIOR APPROACH
Anesthesia: Spinal | Site: Hip | Laterality: Right

## 2017-10-31 MED ORDER — ONDANSETRON HCL 4 MG/2ML IJ SOLN
INTRAMUSCULAR | Status: AC
  Filled 2017-10-31: qty 2

## 2017-10-31 MED ORDER — ALBUMIN HUMAN 5 % IV SOLN
12.5000 g | Freq: Once | INTRAVENOUS | Status: AC
  Administered 2017-10-31: 12.5 g via INTRAVENOUS

## 2017-10-31 MED ORDER — BUPIVACAINE HCL (PF) 0.5 % IJ SOLN
INTRAMUSCULAR | Status: DC | PRN
  Administered 2017-10-31: 2.2 mL via INTRATHECAL

## 2017-10-31 MED ORDER — PHENYLEPHRINE HCL 10 MG/ML IJ SOLN
INTRAMUSCULAR | Status: DC | PRN
  Administered 2017-10-31: 25 ug/min via INTRAVENOUS

## 2017-10-31 MED ORDER — FENTANYL CITRATE (PF) 100 MCG/2ML IJ SOLN
INTRAMUSCULAR | Status: DC | PRN
  Administered 2017-10-31: 25 ug via INTRAVENOUS

## 2017-10-31 MED ORDER — HYDROCODONE-ACETAMINOPHEN 5-325 MG PO TABS
1.0000 | ORAL_TABLET | Freq: Four times a day (QID) | ORAL | Status: DC | PRN
  Administered 2017-10-31 (×2): 2 via ORAL
  Administered 2017-11-01: 1 via ORAL
  Administered 2017-11-01: 2 via ORAL
  Administered 2017-11-01: 1 via ORAL
  Administered 2017-11-02 – 2017-11-04 (×3): 2 via ORAL
  Filled 2017-10-31 (×2): qty 2
  Filled 2017-10-31: qty 1
  Filled 2017-10-31 (×5): qty 2

## 2017-10-31 MED ORDER — PROPOFOL 500 MG/50ML IV EMUL
INTRAVENOUS | Status: DC | PRN
  Administered 2017-10-31: 40 ug/kg/min via INTRAVENOUS

## 2017-10-31 MED ORDER — 0.9 % SODIUM CHLORIDE (POUR BTL) OPTIME
TOPICAL | Status: DC | PRN
  Administered 2017-10-31: 1000 mL

## 2017-10-31 MED ORDER — KETAMINE HCL-SODIUM CHLORIDE 100-0.9 MG/10ML-% IV SOSY
PREFILLED_SYRINGE | INTRAVENOUS | Status: AC
  Filled 2017-10-31: qty 10

## 2017-10-31 MED ORDER — ONDANSETRON HCL 4 MG/2ML IJ SOLN
4.0000 mg | Freq: Four times a day (QID) | INTRAMUSCULAR | Status: DC | PRN
  Administered 2017-11-01: 4 mg via INTRAVENOUS
  Filled 2017-10-31: qty 2

## 2017-10-31 MED ORDER — SODIUM CHLORIDE 0.9 % IR SOLN
Status: DC | PRN
  Administered 2017-10-31: 3000 mL

## 2017-10-31 MED ORDER — LACTATED RINGERS IV SOLN
INTRAVENOUS | Status: DC | PRN
  Administered 2017-10-31 (×2): via INTRAVENOUS

## 2017-10-31 MED ORDER — ONDANSETRON HCL 4 MG PO TABS
4.0000 mg | ORAL_TABLET | Freq: Four times a day (QID) | ORAL | Status: DC | PRN
  Filled 2017-10-31: qty 1

## 2017-10-31 MED ORDER — CEFAZOLIN SODIUM-DEXTROSE 2-4 GM/100ML-% IV SOLN
2.0000 g | INTRAVENOUS | Status: AC
  Administered 2017-10-31: 2 g via INTRAVENOUS

## 2017-10-31 MED ORDER — FENTANYL CITRATE (PF) 250 MCG/5ML IJ SOLN
INTRAMUSCULAR | Status: AC
  Filled 2017-10-31: qty 5

## 2017-10-31 MED ORDER — MENTHOL 3 MG MT LOZG: 1.0000 | LOZENGE | OROMUCOSAL | Status: DC | PRN

## 2017-10-31 MED ORDER — CEFAZOLIN SODIUM-DEXTROSE 2-4 GM/100ML-% IV SOLN
2.0000 g | Freq: Four times a day (QID) | INTRAVENOUS | Status: AC
  Administered 2017-10-31 – 2017-11-01 (×3): 2 g via INTRAVENOUS
  Filled 2017-10-31 (×3): qty 100

## 2017-10-31 MED ORDER — FENTANYL CITRATE (PF) 100 MCG/2ML IJ SOLN: 25.0000 ug | INTRAMUSCULAR | Status: DC | PRN

## 2017-10-31 MED ORDER — ENOXAPARIN SODIUM 30 MG/0.3ML ~~LOC~~ SOLN: 30.0000 mg | SUBCUTANEOUS | 0 refills | Status: DC

## 2017-10-31 MED ORDER — KETAMINE HCL 10 MG/ML IJ SOLN
INTRAMUSCULAR | Status: DC | PRN
  Administered 2017-10-31 (×2): 10 mg via INTRAVENOUS

## 2017-10-31 MED ORDER — PROPOFOL 10 MG/ML IV BOLUS
INTRAVENOUS | Status: DC | PRN
  Administered 2017-10-31 (×5): 10 mg via INTRAVENOUS

## 2017-10-31 MED ORDER — ACETAMINOPHEN 650 MG RE SUPP: 650.0000 mg | Freq: Four times a day (QID) | RECTAL | Status: DC | PRN

## 2017-10-31 MED ORDER — SODIUM CHLORIDE 0.9 % IV SOLN
INTRAVENOUS | Status: DC
  Administered 2017-10-31: 12:00:00 via INTRAVENOUS

## 2017-10-31 MED ORDER — PROPOFOL 10 MG/ML IV BOLUS
INTRAVENOUS | Status: AC
  Filled 2017-10-31: qty 20

## 2017-10-31 MED ORDER — ACETAMINOPHEN 325 MG PO TABS
650.0000 mg | ORAL_TABLET | Freq: Four times a day (QID) | ORAL | Status: DC | PRN
  Administered 2017-11-02 – 2017-11-03 (×5): 650 mg via ORAL
  Filled 2017-10-31 (×5): qty 2

## 2017-10-31 MED ORDER — OXYCODONE HCL 5 MG PO TABS: 5.0000 mg | ORAL_TABLET | ORAL | Status: DC | PRN

## 2017-10-31 MED ORDER — METOCLOPRAMIDE HCL 5 MG PO TABS: 5.0000 mg | ORAL_TABLET | Freq: Three times a day (TID) | ORAL | Status: DC | PRN

## 2017-10-31 MED ORDER — ALBUMIN HUMAN 5 % IV SOLN
INTRAVENOUS | Status: AC
  Administered 2017-10-31: 12.5 g via INTRAVENOUS
  Filled 2017-10-31: qty 250

## 2017-10-31 MED ORDER — ALUM & MAG HYDROXIDE-SIMETH 200-200-20 MG/5ML PO SUSP: 30.0000 mL | ORAL | Status: DC | PRN

## 2017-10-31 MED ORDER — METHOCARBAMOL 500 MG PO TABS
ORAL_TABLET | ORAL | Status: AC
  Administered 2017-10-31: 500 mg via ORAL
  Filled 2017-10-31: qty 1

## 2017-10-31 MED ORDER — TRANEXAMIC ACID 1000 MG/10ML IV SOLN
1000.0000 mg | INTRAVENOUS | Status: AC
  Administered 2017-10-31: 1000 mg via INTRAVENOUS
  Filled 2017-10-31: qty 10

## 2017-10-31 MED ORDER — ONDANSETRON HCL 4 MG/2ML IJ SOLN
INTRAMUSCULAR | Status: DC | PRN
  Administered 2017-10-31: 4 mg via INTRAVENOUS

## 2017-10-31 MED ORDER — METHOCARBAMOL 500 MG PO TABS
500.0000 mg | ORAL_TABLET | Freq: Four times a day (QID) | ORAL | Status: DC | PRN
  Administered 2017-10-31 – 2017-11-03 (×7): 500 mg via ORAL
  Filled 2017-10-31 (×8): qty 1

## 2017-10-31 MED ORDER — ONDANSETRON HCL 4 MG/2ML IJ SOLN: 4.0000 mg | Freq: Once | INTRAMUSCULAR | Status: DC | PRN

## 2017-10-31 MED ORDER — ENOXAPARIN SODIUM 40 MG/0.4ML ~~LOC~~ SOLN
40.0000 mg | SUBCUTANEOUS | Status: DC
  Administered 2017-11-01 – 2017-11-04 (×4): 40 mg via SUBCUTANEOUS
  Filled 2017-10-31 (×4): qty 0.4

## 2017-10-31 MED ORDER — METOCLOPRAMIDE HCL 5 MG/ML IJ SOLN: 5.0000 mg | Freq: Three times a day (TID) | INTRAMUSCULAR | Status: DC | PRN

## 2017-10-31 MED ORDER — TRANEXAMIC ACID 1000 MG/10ML IV SOLN
2000.0000 mg | Freq: Once | INTRAVENOUS | Status: DC
  Filled 2017-10-31: qty 20

## 2017-10-31 MED ORDER — POVIDONE-IODINE 10 % EX SWAB: 2.0000 "application " | Freq: Once | CUTANEOUS | Status: DC

## 2017-10-31 MED ORDER — METHOCARBAMOL 1000 MG/10ML IJ SOLN
500.0000 mg | Freq: Four times a day (QID) | INTRAVENOUS | Status: DC | PRN
  Filled 2017-10-31 (×2): qty 5

## 2017-10-31 MED ORDER — HYDROCODONE-ACETAMINOPHEN 7.5-325 MG PO TABS: 1.0000 | ORAL_TABLET | Freq: Four times a day (QID) | ORAL | 0 refills | Status: DC | PRN

## 2017-10-31 MED ORDER — PHENOL 1.4 % MT LIQD: 1.0000 | OROMUCOSAL | Status: DC | PRN

## 2017-10-31 MED ORDER — MORPHINE SULFATE (PF) 4 MG/ML IV SOLN: 0.5000 mg | INTRAVENOUS | Status: DC | PRN

## 2017-10-31 MED ORDER — TRANEXAMIC ACID 1000 MG/10ML IV SOLN
INTRAVENOUS | Status: AC | PRN
  Administered 2017-10-31: 2000 mg via TOPICAL

## 2017-10-31 SURGICAL SUPPLY — 40 items
BAG DECANTER FOR FLEXI CONT (MISCELLANEOUS) ×3 IMPLANT
CAPT HIP HEMI 2 ×3 IMPLANT
CELLS DAT CNTRL 66122 CELL SVR (MISCELLANEOUS) ×1 IMPLANT
COVER SURGICAL LIGHT HANDLE (MISCELLANEOUS) ×3 IMPLANT
DRAPE C-ARM 42X72 X-RAY (DRAPES) ×3 IMPLANT
DRAPE STERI IOBAN 125X83 (DRAPES) ×3 IMPLANT
DRAPE U-SHAPE 47X51 STRL (DRAPES) ×9 IMPLANT
DRSG AQUACEL AG ADV 3.5X10 (GAUZE/BANDAGES/DRESSINGS) ×3 IMPLANT
DURAPREP 26ML APPLICATOR (WOUND CARE) ×3 IMPLANT
ELECT BLADE 4.0 EZ CLEAN MEGAD (MISCELLANEOUS) ×3
ELECT REM PT RETURN 9FT ADLT (ELECTROSURGICAL) ×3
ELECTRODE BLDE 4.0 EZ CLN MEGD (MISCELLANEOUS) ×1 IMPLANT
ELECTRODE REM PT RTRN 9FT ADLT (ELECTROSURGICAL) ×1 IMPLANT
GLOVE SKINSENSE NS SZ7.5 (GLOVE) ×2
GLOVE SKINSENSE STRL SZ7.5 (GLOVE) ×1 IMPLANT
GLOVE SURG SYN 7.5  E (GLOVE) ×6
GLOVE SURG SYN 7.5 E (GLOVE) ×3 IMPLANT
GOWN SRG XL XLNG 56XLVL 4 (GOWN DISPOSABLE) ×1 IMPLANT
GOWN STRL NON-REIN XL XLG LVL4 (GOWN DISPOSABLE) ×2
GOWN STRL REUS W/ TWL LRG LVL3 (GOWN DISPOSABLE) ×2 IMPLANT
GOWN STRL REUS W/TWL LRG LVL3 (GOWN DISPOSABLE) ×4
HANDPIECE INTERPULSE COAX TIP (DISPOSABLE) ×2
HOOD PEEL AWAY FLYTE STAYCOOL (MISCELLANEOUS) ×6 IMPLANT
IV NS IRRIG 3000ML ARTHROMATIC (IV SOLUTION) ×3 IMPLANT
KIT BASIN OR (CUSTOM PROCEDURE TRAY) ×3 IMPLANT
MARKER SKIN DUAL TIP RULER LAB (MISCELLANEOUS) ×3 IMPLANT
PACK TOTAL JOINT (CUSTOM PROCEDURE TRAY) ×3 IMPLANT
PACK UNIVERSAL I (CUSTOM PROCEDURE TRAY) ×3 IMPLANT
RTRCTR WOUND ALEXIS 18CM MED (MISCELLANEOUS) ×3
SAW OSC TIP CART 19.5X105X1.3 (SAW) ×3 IMPLANT
SET HNDPC FAN SPRY TIP SCT (DISPOSABLE) ×1 IMPLANT
STAPLER VISISTAT 35W (STAPLE) ×3 IMPLANT
SUT ETHIBOND 2 V 37 (SUTURE) ×3 IMPLANT
SUT VIC AB 1 CT1 27 (SUTURE) ×2
SUT VIC AB 1 CT1 27XBRD ANBCTR (SUTURE) ×1 IMPLANT
SUT VIC AB 2-0 CT1 27 (SUTURE) ×2
SUT VIC AB 2-0 CT1 TAPERPNT 27 (SUTURE) ×1 IMPLANT
TOWEL OR 17X26 10 PK STRL BLUE (TOWEL DISPOSABLE) ×3 IMPLANT
TRAY CATH 16FR W/PLASTIC CATH (SET/KITS/TRAYS/PACK) IMPLANT
YANKAUER SUCT BULB TIP NO VENT (SUCTIONS) ×3 IMPLANT

## 2017-10-31 NOTE — Progress Notes (Signed)
Pharmacy called to send IV robaxin to PACU for pt

## 2017-10-31 NOTE — Progress Notes (Signed)
Pelvic xray done at 0930

## 2017-10-31 NOTE — Op Note (Signed)
TOTAL HIP ARTHROPLASTY DIRECT ANTERIOR APPROACH  Procedure Note Amanda Salas   [id number]  Pre-op Diagnosis: Right hip fracture     Post-op Diagnosis: same   Operative Procedures  1. Prosthetic replacement for femoral neck fracture. CPT [phone]  Personnel  Surgeon(s): Leandrew Koyanagi, MD   Anesthesia: spinal  Prosthesis: depuy Femur: corail ka 14 Head: 42 mm size: +1.5 Bearing Type: bipolar  Hip Hemiarthroplasty (Anterior Approach) Op Note:  After informed consent was obtained and the operative extremity marked in the holding area, the patient was brought back to the operating room and placed supine on the HANA table. Next, the operative extremity was prepped and draped in normal sterile fashion. Surgical timeout occurred verifying patient identification, surgical site, surgical procedure and administration of antibiotics.  A modified anterior Smith-Peterson approach to the hip was performed, using the interval between tensor fascia lata and sartorius.  Dissection was carried bluntly down onto the anterior hip capsule. The lateral femoral circumflex vessels were identified and coagulated. A capsulotomy was performed and the capsular flaps tagged for later repair.  Fluoroscopy was utilized to prepare for the femoral neck cut. The neck osteotomy was performed. The femoral head was removed and found a 42 mm head was the appropriate fit.    We then turned our attention to the femur.  After placing the femoral hook, the leg was taken to externally rotated, extended and adducted position taking care to perform soft tissue releases to allow for adequate mobilization of the femur. Soft tissue was cleared from the shoulder of the greater trochanter and the hook elevator used to improve exposure of the proximal femur. Sequential broaching performed up to a size 14. Trial neck and head were placed. The leg was brought back up to neutral and the construct reduced. The position and sizing of  components, offset and leg lengths were checked using fluoroscopy. Stability of the construct was checked in extension and external rotation without any subluxation or impingement of prosthesis. We dislocated the prosthesis, dropped the leg back into position, removed trial components, and irrigated copiously. The final stem and head was then placed, the leg brought back up, the system reduced and fluoroscopy used to verify positioning.  We irrigated, obtained hemostasis and closed the capsule using #2 ethibond suture.  The fascia was closed with #1 vicryl plus, the deep fat layer was closed with 0 vicryl, the subcutaneous layers closed with 2.0 Vicryl Plus and the skin closed with staples. A sterile dressing was applied. The patient was awakened in the operating room and taken to recovery in stable condition. All sponge, needle, and instrument counts were correct at the end of the case.   Position: supine  Complications: none.  Time Out: performed   Drains/Packing: none  Estimated blood loss: 200 cc  Returned to Recovery Room: in good condition.   Antibiotics: yes   Mechanical VTE (DVT) Prophylaxis: sequential compression devices, TED thigh-high  Chemical VTE (DVT) Prophylaxis: lovenox  Fluid Replacement: Crystalloid: see anesthesia record  Specimens Removed: 1 to pathology   Sponge and Instrument Count Correct? yes   PACU: portable radiograph - low AP   Admission: inpatient status, start PT & OT POD#1  Plan/RTC: Return in 2 weeks for staple removal. Return in 6 weeks to see MD.  Weight Bearing/Load Lower Extremity: full  Hip precautions: none Suture Removal: 10-14 days  Betadine to incision twice daily once dressing is removed on POD#7  N. Eduard Roux, MD [facility] [phone] 9:03 AM  Implant Name Type Inv. Item Serial No. Manufacturer Lot No. LRB No. Used  HIP BALL ARTICU DEPUY - [id number] Hips HIP BALL ARTICU DEPUY  DEPUY SYNTHES [id number] Right 1    BIPOLAR AML DEPUY 42MM - [id number] Hips BIPOLAR AML DEPUY 42MM  DEPUY SYNTHES [id number] Right 1  STEM CORAIL KA14 - [id number] Stem STEM CORAIL KA14  DEPUY SYNTHES [id number] Right 1

## 2017-10-31 NOTE — Anesthesia Procedure Notes (Signed)
Spinal  Patient location during procedure: OR Start time: 10/31/2017 7:38 AM End time: 10/31/2017 7:42 AM Staffing Anesthesiologist: Audry Pili, MD Performed: anesthesiologist  Preanesthetic Checklist Completed: patient identified, surgical consent, pre-op evaluation, timeout performed, IV checked, risks and benefits discussed and monitors and equipment checked Spinal Block Patient position: right lateral decubitus Prep: ChloraPrep Patient monitoring: heart rate, cardiac monitor, continuous pulse ox and blood pressure Approach: midline Location: L3-4 Injection technique: single-shot Needle Needle type: Quincke  Needle gauge: 22 G Additional Notes Functioning IV was confirmed and monitors were applied. Sterile prep and drape, including hand hygiene, mask, and sterile gloves were used. The patient was positioned and the spine was prepped. The skin was anesthetized with lidocaine. Free flow of clear CSF was obtained prior to injecting local anesthetic into the CSF. The spinal needle aspirated freely following injection. The needle was carefully withdrawn. The patient tolerated the procedure well. Consent was obtained prior to the procedure with all questions answered and concerns addressed. Risks including, but not limited to, bleeding, infection, nerve damage, paralysis, failed block, inadequate analgesia, allergic reaction, high spinal, itching, and headache were discussed and the patient wished to proceed.  Renold Don, MD

## 2017-10-31 NOTE — Transfer of Care (Signed)
Immediate Anesthesia Transfer of Care Note  Patient: Amanda Salas  Procedure(s) Performed: TOTAL HIP ARTHROPLASTY DIRECT ANTERIOR APPROACH (Right Hip)  Patient Location: PACU  Anesthesia Type:MAC and Spinal  Level of Consciousness: awake and alert   Airway & Oxygen Therapy: Patient Spontanous Breathing and Patient connected to nasal cannula oxygen  Post-op Assessment: Report given to RN and Post -op Vital signs reviewed and stable  Post vital signs: Reviewed and stable  Last Vitals:  Vitals:   10/30/17 2026 10/31/17 0352  BP: (!) 114/52 (!) 105/38  Pulse: 75 72  Resp: 15 16  Temp: 36.8 C 36.9 C  SpO2: 92% 93%    Last Pain:  Vitals:   10/31/17 0410  TempSrc:   PainSc: Asleep         Complications: No apparent anesthesia complications

## 2017-10-31 NOTE — Discharge Instructions (Signed)
° ° °  1. Change dressings as needed °2. May shower but keep incisions covered and dry °3. Take lovenox to prevent blood clots °4. Take stool softeners as needed °5. Take pain meds as needed ° °

## 2017-10-31 NOTE — Anesthesia Postprocedure Evaluation (Signed)
Anesthesia Post Note  Patient: Amanda Salas  Procedure(s) Performed: TOTAL HIP ARTHROPLASTY DIRECT ANTERIOR APPROACH (Right Hip)     Patient location during evaluation: PACU Anesthesia Type: Spinal Level of consciousness: awake and alert Pain management: pain level controlled Vital Signs Assessment: post-procedure vital signs reviewed and stable Respiratory status: spontaneous breathing and respiratory function stable Cardiovascular status: blood pressure returned to baseline and stable Postop Assessment: spinal receding and no apparent nausea or vomiting Anesthetic complications: no    Last Vitals:  Vitals:   10/31/17 1057 10/31/17 1100  BP:  124/68  Pulse: 63 64  Resp: 18 17  Temp:    SpO2: 96% 91%    Last Pain:  Vitals:   10/31/17 0410  TempSrc:   PainSc: [place]

## 2017-10-31 NOTE — Progress Notes (Signed)
F/U call to pharmacy regarding IV Robaxin - will send per pharmacy

## 2017-10-31 NOTE — Anesthesia Procedure Notes (Signed)
Procedure Name: MAC Date/Time: 10/31/2017 7:35 AM Performed by: Wilburn Cornelia, CRNA Pre-anesthesia Checklist: Patient identified, Emergency Drugs available, Suction available, Patient being monitored and Timeout performed Patient Re-evaluated:Patient Re-evaluated prior to induction Oxygen Delivery Method: Nasal cannula Placement Confirmation: positive ETCO2 and breath sounds checked- equal and bilateral Dental Injury: Teeth and Oropharynx as per pre-operative assessment

## 2017-10-31 NOTE — Anesthesia Preprocedure Evaluation (Addendum)
Anesthesia Evaluation  Patient identified by MRN, date of birth, ID band Patient awake    Reviewed: Allergy & Precautions, NPO status , Patient's Chart, lab work & pertinent test results  Airway Mallampati: III  TM Distance: >3 FB Neck ROM: Full    Dental  (+) Edentulous Upper, Edentulous Lower   Pulmonary neg pulmonary ROS,    Pulmonary exam normal breath sounds clear to auscultation       Cardiovascular hypertension, Pt. on medications (-) Past MI Normal cardiovascular exam Rhythm:Regular Rate:Normal  EKG - septal Q waves   Neuro/Psych negative neurological ROS  negative psych ROS   GI/Hepatic GERD  Controlled,(+) Hepatitis -  Endo/Other  negative endocrine ROS  Renal/GU negative Renal ROS  negative genitourinary   Musculoskeletal  (+) Arthritis ,   Abdominal   Peds  Hematology  (+) anemia , Thrombocytopenia   Anesthesia Other Findings   Reproductive/Obstetrics                            Anesthesia Physical Anesthesia Plan  ASA: III  Anesthesia Plan: Spinal   Post-op Pain Management:    Induction:   PONV Risk Score and Plan: Treatment may vary due to age or medical condition and Propofol infusion  Airway Management Planned:   Additional Equipment: None  Intra-op Plan:   Post-operative Plan:   Informed Consent: I have reviewed the patients History and Physical, chart, labs and discussed the procedure including the risks, benefits and alternatives for the proposed anesthesia with the patient or authorized representative who has indicated his/her understanding and acceptance.   Dental advisory given  Plan Discussed with: CRNA  Anesthesia Plan Comments: (Plan for spinal with isobaric bupivacaine)       Anesthesia Quick Evaluation

## 2017-10-31 NOTE — Progress Notes (Signed)
Pt given PO robaxin- IV robaxin has not arrived from pharmacy-pt tolerating PO liquids

## 2017-10-31 NOTE — Progress Notes (Signed)
PROGRESS NOTE    ZYLA DASCENZO  NTI:[id number] DOB: 08/21/1927 DOA: 10/30/2017 PCP: Abner Greenspan, MD     Brief Narrative:  Amanda Salas is a 83 yo female with past medical hx of osteoporosis, HTN, GERD, HLD, mild dementia. She presents to the hospital after a mechanical fall. Patient was working at her sink, when she tried to turn she lost her footing and fell on the ground on the right side.  She hit her head and had some bleeding.  She did not passed out denies having any dizziness lightheadedness was awake all throughout the episode. In the ED, she had a right scalp laceration repaired. Orthopedic surgery was consulted for her right hip fracture. She underwent right total hip arthroplasty 11/11.   Assessment & Plan:   Active Problems:   Hyperlipidemia   Essential hypertension   Osteoporosis   Memory loss   Compression fracture of thoracolumbar vertebra (HCC)   Anemia   Subcapital fracture of right hip (HCC)   Hyponatremia   Leucocytosis   Right closed subcapital hip fracture following mechanical fall -CT head and CT C-spine unremarkable for any acute fracture -Patient has sustained scalp laceration which will be repaired by EDP -X-ray tibia-fibula, x-ray pelvis shows no evidence of acute fracture on the right side -X-ray chest no evidence of rib fracture.  No acute abnormality as well -S/p right total hip arthroplasty 11/11. Follow up in 2 weeks for staple removal, 6 weeks with MD  -PT OT postop -Family wants to take patient home with home health PT OT rather than SNF -Pain control  Mild dementia -Supportive care  CKD stage 3 -Stable   Normocytic anemia -Stable    DVT prophylaxis: lovenox Code Status: Full Family Communication: at bedside Disposition Plan: pending PT OT    Consultants:   Orthopedic surgery  Procedures:   S/p right total hip arthroplasty 11/11  Antimicrobials:  Anti-infectives (From admission, onward)   Start     Dose/Rate  Route Frequency Ordered Stop   10/31/17 1215  ceFAZolin (ANCEF) IVPB 2g/100 mL premix     2 g 200 mL/hr over 30 Minutes Intravenous Every 6 hours 10/31/17 1200 11/01/17 0559   10/31/17 0600  ceFAZolin (ANCEF) IVPB 2g/100 mL premix     2 g 200 mL/hr over 30 Minutes Intravenous To Short Stay 10/31/17 0352 10/31/17 0755        Subjective: Patient seen post-operatively. Complaining of pain in right hip, no other complaints. Was able to eat some broth and jello but no appetite.   Objective: Vitals:   10/31/17 1100 10/31/17 1105 10/31/17 1130 10/31/17 1200  BP: 124/68  (!) 112/51 (!) 124/46  Pulse: 64  61 67  Resp: 17  15   Temp:  97.9 F (36.6 C)  98.8 F (37.1 C)  TempSrc:    Oral  SpO2: 91%  100% 93%  Weight:      Height:        Intake/Output Summary (Last 24 hours) at 10/31/2017 1345 Last data filed at 10/31/2017 1120 Gross per 24 hour  Intake 2470 ml  Output 950 ml  Net 1520 ml   Filed Weights   10/30/17 1753  Weight: 56.7 kg (125 lb)    Examination:  General exam: Appears calm and comfortable  Respiratory system: Clear to auscultation. Respiratory effort normal. Cardiovascular system: S1 & S2 heard, RRR. No JVD, murmurs, rubs, gallops or clicks. No pedal edema. Gastrointestinal system: Abdomen is nondistended, soft and nontender. No organomegaly  or masses felt. Normal bowel sounds heard. Central nervous system: Alert and oriented. Speech clear  Skin: No rashes, lesions or ulcers Psychiatry: Judgement and insight appear normal. Mood & affect appropriate.   Data Reviewed: I have personally reviewed following labs and imaging studies  CBC: Recent Labs  Lab 10/30/17 1534 10/30/17 2042 10/31/17 0352  WBC 14.3* 11.5* 8.0  NEUTROABS 12.4*  --  5.8  HGB 11.3* 10.4* 9.4*  HCT 35.1* 32.0* 28.8*  MCV 86.9 86.5 86.0  PLT 146* 133* 601*   Basic Metabolic Panel: Recent Labs  Lab 10/30/17 1534 10/31/17 0352  NA 134* 136  K 3.6 3.8  CL 105 108  CO2 23 21*    GLUCOSE 111* 103*  BUN 16 14  CREATININE 0.96 1.00  CALCIUM 8.6* 7.9*  MG  --  1.8   GFR: Estimated Creatinine Clearance: 32.8 mL/min (by C-G formula based on SCr of 1 mg/dL). Liver Function Tests: Recent Labs  Lab 10/30/17 2042 10/31/17 0352  AST 23 21  ALT 18 14  ALKPHOS 51 42  BILITOT 0.7 0.7  PROT 5.8* 5.4*  ALBUMIN 3.3* 2.8*   No results for input(s): LIPASE, AMYLASE in the last 168 hours. No results for input(s): AMMONIA in the last 168 hours. Coagulation Profile: Recent Labs  Lab 10/30/17 2042  INR 1.07   Cardiac Enzymes: No results for input(s): CKTOTAL, CKMB, CKMBINDEX, TROPONINI in the last 168 hours. BNP (last 3 results) No results for input(s): PROBNP in the last 8760 hours. HbA1C: No results for input(s): HGBA1C in the last 72 hours. CBG: No results for input(s): GLUCAP in the last 168 hours. Lipid Profile: No results for input(s): CHOL, HDL, LDLCALC, TRIG, CHOLHDL, LDLDIRECT in the last 72 hours. Thyroid Function Tests: No results for input(s): TSH, T4TOTAL, FREET4, T3FREE, THYROIDAB in the last 72 hours. Anemia Panel: No results for input(s): VITAMINB12, FOLATE, FERRITIN, TIBC, IRON, RETICCTPCT in the last 72 hours. Sepsis Labs: No results for input(s): PROCALCITON, LATICACIDVEN in the last 168 hours.  Recent Results (from the past 240 hour(s))  Surgical pcr screen     Status: None   Collection Time: 10/30/17 11:53 PM  Result Value Ref Range Status   MRSA, PCR NEGATIVE NEGATIVE Final   Staphylococcus aureus NEGATIVE NEGATIVE Final    Comment: (NOTE) The Xpert SA Assay (FDA approved for NASAL specimens in patients 80 years of age and older), is one component of a comprehensive surveillance program. It is not intended to diagnose infection nor to guide or monitor treatment.        Radiology Studies: Dg Tibia/fibula Right  Result Date: 10/30/2017 CLINICAL DATA:  Pain status post fall. EXAM: RIGHT TIBIA AND FIBULA - 2 VIEW COMPARISON:   None. FINDINGS: There is no evidence of fracture or other focal bone lesions. Mild osteoarthritic changes of the medial and patellofemoral compartment and moderate osteoarthritic changes of the lateral compartment of the knee joint. Soft tissues are unremarkable. IMPRESSION: No acute fracture or dislocation identified about the right Knee. Three compartment osteoarthritic changes worst in the lateral compartment. Electronically Signed   By: Fidela Salisbury M.D.   On: 10/30/2017 16:36   Ct Head Wo Contrast  Result Date: 10/30/2017 CLINICAL DATA:  83 y/o F; status post fall with laceration on the right-sided of patient's head. EXAM: CT HEAD WITHOUT CONTRAST CT CERVICAL SPINE WITHOUT CONTRAST TECHNIQUE: Multidetector CT imaging of the head and cervical spine was performed following the standard protocol without intravenous contrast. Multiplanar CT image reconstructions of  the cervical spine were also generated. COMPARISON:  None. FINDINGS: CT HEAD FINDINGS Brain: No evidence of acute infarction, hemorrhage, hydrocephalus, extra-axial collection or mass lesion/mass effect. Mild chronic microvascular ischemic changes and parenchymal volume loss of the brain. Vascular: Calcific atherosclerosis of carotid siphons. No hyperdense vessel identified. Skull: Right parietal region small scalp contusion and laceration. No calvarial fracture. Sinuses/Orbits: No acute finding. Other: Bilateral intra-ocular lens replacement. CT CERVICAL SPINE FINDINGS Alignment: Normal cervical lordosis.  Grade 1 C3-4 anterolisthesis. Skull base and vertebrae: No acute fracture. No primary bone lesion or focal pathologic process. Soft tissues and spinal canal: No prevertebral fluid or swelling. No visible canal hematoma. Disc levels: Cervical spondylosis with mild C3-4 and moderate severe C5-6 disc space narrowing. Mild left-greater-than-right facet hypertrophy. C5-6 disc osteophyte complex with mild canal stenosis. C5-6 uncovertebral and  facet hypertrophy with left-greater-than-right foraminal encroachment. Upper chest: Negative. Other: Calcific atherosclerosis of carotid siphons. IMPRESSION: 1. Right parietal region small scalp contusion and laceration. 2. No calvarial fracture or acute intracranial abnormality identified. 3. Mild for age chronic microvascular ischemic changes and mild parenchymal volume loss of the brain. 4. No acute fracture or dislocation of cervical spine. 5. Cervical spondylosis greatest at C5-6 level. Electronically Signed   By: Kristine Garbe M.D.   On: 10/30/2017 17:03   Ct Cervical Spine Wo Contrast  Result Date: 10/30/2017 CLINICAL DATA:  83 y/o F; status post fall with laceration on the right-sided of patient's head. EXAM: CT HEAD WITHOUT CONTRAST CT CERVICAL SPINE WITHOUT CONTRAST TECHNIQUE: Multidetector CT imaging of the head and cervical spine was performed following the standard protocol without intravenous contrast. Multiplanar CT image reconstructions of the cervical spine were also generated. COMPARISON:  None. FINDINGS: CT HEAD FINDINGS Brain: No evidence of acute infarction, hemorrhage, hydrocephalus, extra-axial collection or mass lesion/mass effect. Mild chronic microvascular ischemic changes and parenchymal volume loss of the brain. Vascular: Calcific atherosclerosis of carotid siphons. No hyperdense vessel identified. Skull: Right parietal region small scalp contusion and laceration. No calvarial fracture. Sinuses/Orbits: No acute finding. Other: Bilateral intra-ocular lens replacement. CT CERVICAL SPINE FINDINGS Alignment: Normal cervical lordosis.  Grade 1 C3-4 anterolisthesis. Skull base and vertebrae: No acute fracture. No primary bone lesion or focal pathologic process. Soft tissues and spinal canal: No prevertebral fluid or swelling. No visible canal hematoma. Disc levels: Cervical spondylosis with mild C3-4 and moderate severe C5-6 disc space narrowing. Mild left-greater-than-right  facet hypertrophy. C5-6 disc osteophyte complex with mild canal stenosis. C5-6 uncovertebral and facet hypertrophy with left-greater-than-right foraminal encroachment. Upper chest: Negative. Other: Calcific atherosclerosis of carotid siphons. IMPRESSION: 1. Right parietal region small scalp contusion and laceration. 2. No calvarial fracture or acute intracranial abnormality identified. 3. Mild for age chronic microvascular ischemic changes and mild parenchymal volume loss of the brain. 4. No acute fracture or dislocation of cervical spine. 5. Cervical spondylosis greatest at C5-6 level. Electronically Signed   By: Kristine Garbe M.D.   On: 10/30/2017 17:03   Pelvis Portable  Result Date: 10/31/2017 CLINICAL DATA:  Postop right hip EXAM: PORTABLE PELVIS 1-2 VIEWS COMPARISON:  AP pelvis and right femur radiographs - 10/30/2017; intraoperative right hip radiographs - earlier same day FINDINGS: Single AP supine radiograph of the pelvis is provided for review Post right bipolar hip replacement. Alignment appears anatomic given solitary AP projection. No definite fracture. Minimal amount of adjacent subcutaneous emphysema and intra-articular air. Vascular calcifications overlie the right medial thigh. Skin staples overlie the operative site. No radiopaque foreign body. IMPRESSION: Post bipolar right  hip replacement without evidence of complication. Electronically Signed   By: Sandi Mariscal M.D.   On: 10/31/2017 09:50   Dg Pelvis Portable  Result Date: 10/30/2017 CLINICAL DATA:  Status post fall with right hip pain. EXAM: PORTABLE PELVIS 1-2 VIEWS COMPARISON:  None. FINDINGS: There is a sub capitellar fracture of the right femoral neck with foreshortening and mild superior migration of the femur. Mild osteopenia. No evidence of other pelvic or left hip fractures. IMPRESSION: Right femoral neck sub capitellar impacted fracture. Electronically Signed   By: Fidela Salisbury M.D.   On: 10/30/2017 16:35    Dg Chest Portable 1 View  Result Date: 10/30/2017 CLINICAL DATA:  Painful RIGHT hip EXAM: PORTABLE CHEST 1 VIEW COMPARISON:  None. FINDINGS: Normal mediastinum and cardiac silhouette. Normal pulmonary vasculature. No evidence of effusion, infiltrate, or pneumothorax. No acute bony abnormality. IMPRESSION: No acute cardiopulmonary process. Electronically Signed   By: Suzy Bouchard M.D.   On: 10/30/2017 16:36   Dg C-arm 1-60 Min  Result Date: 10/31/2017 CLINICAL DATA:  Right hip replacement EXAM: DG C-ARM 61-120 MIN; OPERATIVE RIGHT HIP WITH PELVIS COMPARISON:  Right femur radiographs-10/30/2017 FINDINGS: Three spot intraoperative fluoroscopic images of the right hip provided for review Images demonstrate the sequela of right bipolar hip replacement. Alignment appears anatomic given solitary AP projection. There is a minimal amount of expected subcutaneous emphysema about the operative site. No radiopaque foreign body. No definite fracture. IMPRESSION: Post right-sided bipolar hip replacement without evidence of complication. Electronically Signed   By: Sandi Mariscal M.D.   On: 10/31/2017 09:04   Dg Hip Operative Unilat With Pelvis Right  Result Date: 10/31/2017 CLINICAL DATA:  Right hip replacement EXAM: DG C-ARM 61-120 MIN; OPERATIVE RIGHT HIP WITH PELVIS COMPARISON:  Right femur radiographs-10/30/2017 FINDINGS: Three spot intraoperative fluoroscopic images of the right hip provided for review Images demonstrate the sequela of right bipolar hip replacement. Alignment appears anatomic given solitary AP projection. There is a minimal amount of expected subcutaneous emphysema about the operative site. No radiopaque foreign body. No definite fracture. IMPRESSION: Post right-sided bipolar hip replacement without evidence of complication. Electronically Signed   By: Sandi Mariscal M.D.   On: 10/31/2017 09:04   Dg Femur Port, Min 2 Views Right  Result Date: 10/30/2017 CLINICAL DATA:  Right hip pain post  fall. EXAM: RIGHT FEMUR PORTABLE 2 VIEW COMPARISON:  None. FINDINGS: Sub capitellar mildly impacted right femoral neck fracture with mild superior migration of the right femur. Evidence of dislocation. Mild osteopenia. IMPRESSION: Sub capitellar mildly impacted right femoral neck fracture. Electronically Signed   By: Fidela Salisbury M.D.   On: 10/30/2017 16:37      Scheduled Meds: . calcitonin (salmon)  1 spray Alternating Nares Daily  . cholecalciferol  1,000 Units Oral Daily  . docusate sodium  100 mg Oral BID  . donepezil  5 mg Oral QHS  . [START ON 11/01/2017] enoxaparin (LOVENOX) injection  40 mg Subcutaneous Q24H  . ferrous sulfate  325 mg Oral TID PC  . polyethylene glycol  17 g Oral Daily  . simvastatin  20 mg Oral QHS   Continuous Infusions: . sodium chloride    . sodium chloride 125 mL/hr at 10/31/17 1220  .  ceFAZolin (ANCEF) IV Stopped (10/31/17 1329)  . methocarbamol (ROBAXIN)  IV    . tranexamic acid (CYKLOKAPRON) topical -INTRAOP       LOS: 1 day    Time spent: 40 minutes   Dessa Phi, DO [facility] [email or web address]  Password [email or web address] 10/31/2017, 1:45 PM

## 2017-11-01 ENCOUNTER — Encounter (HOSPITAL_COMMUNITY): Payer: Self-pay | Admitting: Orthopaedic Surgery

## 2017-11-01 LAB — VITAMIN D 25 HYDROXY (VIT D DEFICIENCY, FRACTURES): VIT D 25 HYDROXY: 26.2 ng/mL — AB (ref 30.0–100.0)

## 2017-11-01 LAB — BASIC METABOLIC PANEL
Anion gap: 6 (ref 5–15)
BUN: 12 mg/dL (ref 6–20)
CALCIUM: 7.6 mg/dL — AB (ref 8.9–10.3)
CO2: 21 mmol/L — AB (ref 22–32)
CREATININE: 1.05 mg/dL — AB (ref 0.44–1.00)
Chloride: 105 mmol/L (ref 101–111)
GFR calc non Af Amer: 46 mL/min — ABNORMAL LOW (ref >60)
GFR, EST AFRICAN AMERICAN: 54 mL/min — AB (ref >60)
Glucose, Bld: 109 mg/dL — ABNORMAL HIGH (ref 65–99)
Potassium: 3.7 mmol/L (ref 3.5–5.1)
SODIUM: 132 mmol/L — AB (ref 135–145)

## 2017-11-01 LAB — CBC
HCT: 25.3 % — ABNORMAL LOW (ref 36.0–46.0)
Hemoglobin: 8.4 g/dL — ABNORMAL LOW (ref 12.0–15.0)
MCH: 29.2 pg (ref 26.0–34.0)
MCHC: 33.2 g/dL (ref 30.0–36.0)
MCV: 87.8 fL (ref 78.0–100.0)
Platelets: 94 10*3/uL — ABNORMAL LOW (ref 150–400)
RBC: 2.88 MIL/uL — ABNORMAL LOW (ref 3.87–5.11)
RDW: 14.8 % (ref 11.5–15.5)
WBC: 8.6 10*3/uL (ref 4.0–10.5)

## 2017-11-01 MED ORDER — ENSURE ENLIVE PO LIQD
237.0000 mL | Freq: Two times a day (BID) | ORAL | Status: DC
  Administered 2017-11-01 – 2017-11-04 (×5): 237 mL via ORAL

## 2017-11-01 MED ORDER — METOPROLOL SUCCINATE ER 50 MG PO TB24: 50.0000 mg | ORAL_TABLET | Freq: Every day | ORAL | Status: DC

## 2017-11-01 MED ORDER — LOSARTAN POTASSIUM 50 MG PO TABS
50.0000 mg | ORAL_TABLET | Freq: Every day | ORAL | Status: DC
  Administered 2017-11-02 – 2017-11-04 (×3): 50 mg via ORAL
  Filled 2017-11-01 (×3): qty 1

## 2017-11-01 MED ORDER — METOPROLOL SUCCINATE ER 50 MG PO TB24
50.0000 mg | ORAL_TABLET | Freq: Every day | ORAL | Status: DC
  Administered 2017-11-01 – 2017-11-04 (×4): 50 mg via ORAL
  Filled 2017-11-01 (×4): qty 1

## 2017-11-01 NOTE — Progress Notes (Signed)
Dr. Erlinda Hong notified about patient having frequent PVC's. Order received to restart patient's home dose of Toprol.

## 2017-11-01 NOTE — Care Management Note (Addendum)
Case Management Note  Patient Details  Name: ZYIONNA PESCE MRN: [id number] Date of Birth: 1927-05-15  Subjective/Objective:     Right closed subcapital hip fracture following mechanical fall               Action/Plan: Discharge Planning: NCM spoke to pt and grand-dtr, Chinita Pester. Pt lives in home with grand-dtr. Pt has RW, bedside commode and shower chair at home. Offered choice for HH/list provided. Grand-dtr agreeable to [facility]. Contacted [facility] with new referral.    PCP Abner Greenspan MD   Expected Discharge Date:                 Expected Discharge Plan:  [place]    In-House Referral:  NA  Discharge planning Services  CM Consult  Post Acute Care Choice:  Home Health Choice offered to:  Adult Children  DME Arranged:  N/A DME Agency:  NA  HH Arranged:  PT HH Agency:  [facility] (formerly [facility])  Status of Service:  Completed, signed off  If discussed at [employer], dates discussed:    Additional Comments:  Erenest Rasher, RN 11/01/2017, 3:51 PM

## 2017-11-01 NOTE — Progress Notes (Signed)
   Subjective:  Patient reports pain as mild.  Very alert.  Objective:   VITALS:   Vitals:   10/31/17 2055 10/31/17 2135 11/01/17 0056 11/01/17 0455  BP: (!) 127/48 (!) 124/50 (!) 129/52 130/62  Pulse: 62 64 70 72  Resp: 16 14 15 16   Temp: 98.9 F (37.2 C) 98.1 F (36.7 C) 97.9 F (36.6 C) 98.4 F (36.9 C)  TempSrc: Oral Oral Oral Axillary  SpO2: 99% 96% 98% 93%  Weight:      Height:        Neurologically intact Neurovascular intact Sensation intact distally Intact pulses distally Dorsiflexion/Plantar flexion intact Incision: dressing C/D/I and no drainage No cellulitis present Compartment soft   Lab Results  Component Value Date   WBC 8.6 11/01/2017   HGB 8.4 (L) 11/01/2017   HCT 25.3 (L) 11/01/2017   MCV 87.8 11/01/2017   PLT PENDING 11/01/2017     Assessment/Plan:  1 Day Post-Op   - Expected postop acute blood loss anemia - will monitor for symptoms - Up with PT/OT - DVT ppx - SCDs, ambulation, lovenox - WBAT operative extremity - no hip precautions   Eduard Roux 11/01/2017, 7:38 AM [phone]

## 2017-11-01 NOTE — Progress Notes (Signed)
PROGRESS NOTE    Amanda Salas  WIO:[id number] DOB: 11-05-1927 DOA: 10/30/2017 PCP: Abner Greenspan, MD     Brief Narrative:  Amanda Salas is a 83 yo female with past medical hx of osteoporosis, HTN, GERD, HLD, mild dementia. She presents to the hospital after a mechanical fall. Patient was working at her sink, when she tried to turn she lost her footing and fell on the ground on the right side.  She hit her head and had some bleeding.  She did not passed out denies having any dizziness lightheadedness was awake all throughout the episode. In the ED, she had a right scalp laceration repaired. Orthopedic surgery was consulted for her right hip fracture. She underwent right total hip arthroplasty 11/11.   Assessment & Plan:   Active Problems:   Hyperlipidemia   Essential hypertension   Osteoporosis   Memory loss   Compression fracture of thoracolumbar vertebra (HCC)   Anemia   Subcapital fracture of right hip (HCC)   Hyponatremia   Leucocytosis   Right closed subcapital hip fracture following mechanical fall -CT head and CT C-spine unremarkable for any acute fracture -Patient has sustained scalp laceration which will be repaired by EDP -X-ray tibia-fibula, x-ray pelvis shows no evidence of acute fracture on the right side -X-ray chest no evidence of rib fracture.  No acute abnormality as well -S/p right total hip arthroplasty 11/11. Follow up in 2 weeks for staple removal, 6 weeks with MD  -PT OT postop -Family wants to take patient home with home health PT OT rather than SNF -Pain control  Mild dementia -Supportive care, continue aricept   CKD stage 3 -Stable   Normocytic anemia -Stable, expected postop blood loss   HTN -Resume home toprol, cozaar   DVT prophylaxis: lovenox Code Status: Full Family Communication: at bedside Disposition Plan: pending PT OT, home with HHPT OT    Consultants:   Orthopedic surgery  Procedures:   S/p right total hip  arthroplasty 11/11  Antimicrobials:  Anti-infectives (From admission, onward)   Start     Dose/Rate Route Frequency Ordered Stop   10/31/17 1215  ceFAZolin (ANCEF) IVPB 2g/100 mL premix     2 g 200 mL/hr over 30 Minutes Intravenous Every 6 hours 10/31/17 1200 11/01/17 0113   10/31/17 0600  ceFAZolin (ANCEF) IVPB 2g/100 mL premix     2 g 200 mL/hr over 30 Minutes Intravenous To Short Stay 10/31/17 0352 10/31/17 0755       Subjective: Patient is in good spirits with family at bedside.  She had just worked with physical therapy and it went well.  She has no physical complaints aside from some pain in her right hip.  Denies any chest pain or shortness of breath, no nausea or vomiting.  Excited for the possibility of going home tomorrow.  Objective: Vitals:   10/31/17 2135 11/01/17 0056 11/01/17 0455 11/01/17 1015  BP: (!) 124/50 (!) 129/52 130/62 (!) 134/51  Pulse: 64 70 72   Resp: 14 15 16    Temp: 98.1 F (36.7 C) 97.9 F (36.6 C) 98.4 F (36.9 C)   TempSrc: Oral Oral Axillary   SpO2: 96% 98% 93%   Weight:      Height:        Intake/Output Summary (Last 24 hours) at 11/01/2017 1253 Last data filed at 11/01/2017 0900 Gross per 24 hour  Intake 340 ml  Output 600 ml  Net -260 ml   Filed Weights   10/30/17 1753  Weight: 56.7 kg (125 lb)    Examination:  General exam: Appears calm and comfortable  Respiratory system: Clear to auscultation. Respiratory effort normal. Cardiovascular system: S1 & S2 heard, RRR. No JVD, murmurs, rubs, gallops or clicks. No pedal edema. Gastrointestinal system: Abdomen is nondistended, soft and nontender. No organomegaly or masses felt. Normal bowel sounds heard. Central nervous system: Alert and oriented. Speech clear  Skin: No rashes, lesions or ulcers Psychiatry: Judgement and insight appear normal. Mood & affect appropriate.   Data Reviewed: I have personally reviewed following labs and imaging studies  CBC: Recent Labs  Lab  10/30/17 1534 10/30/17 2042 10/31/17 0352 11/01/17 0559  WBC 14.3* 11.5* 8.0 8.6  NEUTROABS 12.4*  --  5.8  --   HGB 11.3* 10.4* 9.4* 8.4*  HCT 35.1* 32.0* 28.8* 25.3*  MCV 86.9 86.5 86.0 87.8  PLT 146* 133* 123* 94*   Basic Metabolic Panel: Recent Labs  Lab 10/30/17 1534 10/31/17 0352 11/01/17 0559  NA 134* 136 132*  K 3.6 3.8 3.7  CL 105 108 105  CO2 23 21* 21*  GLUCOSE 111* 103* 109*  BUN 16 14 12   CREATININE 0.96 1.00 1.05*  CALCIUM 8.6* 7.9* 7.6*  MG  --  1.8  --    GFR: Estimated Creatinine Clearance: 31.2 mL/min (A) (by C-G formula based on SCr of 1.05 mg/dL (H)). Liver Function Tests: Recent Labs  Lab 10/30/17 2042 10/31/17 0352  AST 23 21  ALT 18 14  ALKPHOS 51 42  BILITOT 0.7 0.7  PROT 5.8* 5.4*  ALBUMIN 3.3* 2.8*   No results for input(s): LIPASE, AMYLASE in the last 168 hours. No results for input(s): AMMONIA in the last 168 hours. Coagulation Profile: Recent Labs  Lab 10/30/17 2042  INR 1.07   Cardiac Enzymes: No results for input(s): CKTOTAL, CKMB, CKMBINDEX, TROPONINI in the last 168 hours. BNP (last 3 results) No results for input(s): PROBNP in the last 8760 hours. HbA1C: No results for input(s): HGBA1C in the last 72 hours. CBG: No results for input(s): GLUCAP in the last 168 hours. Lipid Profile: No results for input(s): CHOL, HDL, LDLCALC, TRIG, CHOLHDL, LDLDIRECT in the last 72 hours. Thyroid Function Tests: No results for input(s): TSH, T4TOTAL, FREET4, T3FREE, THYROIDAB in the last 72 hours. Anemia Panel: No results for input(s): VITAMINB12, FOLATE, FERRITIN, TIBC, IRON, RETICCTPCT in the last 72 hours. Sepsis Labs: No results for input(s): PROCALCITON, LATICACIDVEN in the last 168 hours.  Recent Results (from the past 240 hour(s))  Surgical pcr screen     Status: None   Collection Time: 10/30/17 11:53 PM  Result Value Ref Range Status   MRSA, PCR NEGATIVE NEGATIVE Final   Staphylococcus aureus NEGATIVE NEGATIVE Final     Comment: (NOTE) The Xpert SA Assay (FDA approved for NASAL specimens in patients 19 years of age and older), is one component of a comprehensive surveillance program. It is not intended to diagnose infection nor to guide or monitor treatment.        Radiology Studies: Dg Tibia/fibula Right  Result Date: 10/30/2017 CLINICAL DATA:  Pain status post fall. EXAM: RIGHT TIBIA AND FIBULA - 2 VIEW COMPARISON:  None. FINDINGS: There is no evidence of fracture or other focal bone lesions. Mild osteoarthritic changes of the medial and patellofemoral compartment and moderate osteoarthritic changes of the lateral compartment of the knee joint. Soft tissues are unremarkable. IMPRESSION: No acute fracture or dislocation identified about the right Knee. Three compartment osteoarthritic changes worst in the lateral  compartment. Electronically Signed   By: Fidela Salisbury M.D.   On: 10/30/2017 16:36   Ct Head Wo Contrast  Result Date: 10/30/2017 CLINICAL DATA:  83 y/o F; status post fall with laceration on the right-sided of patient's head. EXAM: CT HEAD WITHOUT CONTRAST CT CERVICAL SPINE WITHOUT CONTRAST TECHNIQUE: Multidetector CT imaging of the head and cervical spine was performed following the standard protocol without intravenous contrast. Multiplanar CT image reconstructions of the cervical spine were also generated. COMPARISON:  None. FINDINGS: CT HEAD FINDINGS Brain: No evidence of acute infarction, hemorrhage, hydrocephalus, extra-axial collection or mass lesion/mass effect. Mild chronic microvascular ischemic changes and parenchymal volume loss of the brain. Vascular: Calcific atherosclerosis of carotid siphons. No hyperdense vessel identified. Skull: Right parietal region small scalp contusion and laceration. No calvarial fracture. Sinuses/Orbits: No acute finding. Other: Bilateral intra-ocular lens replacement. CT CERVICAL SPINE FINDINGS Alignment: Normal cervical lordosis.  Grade 1 C3-4  anterolisthesis. Skull base and vertebrae: No acute fracture. No primary bone lesion or focal pathologic process. Soft tissues and spinal canal: No prevertebral fluid or swelling. No visible canal hematoma. Disc levels: Cervical spondylosis with mild C3-4 and moderate severe C5-6 disc space narrowing. Mild left-greater-than-right facet hypertrophy. C5-6 disc osteophyte complex with mild canal stenosis. C5-6 uncovertebral and facet hypertrophy with left-greater-than-right foraminal encroachment. Upper chest: Negative. Other: Calcific atherosclerosis of carotid siphons. IMPRESSION: 1. Right parietal region small scalp contusion and laceration. 2. No calvarial fracture or acute intracranial abnormality identified. 3. Mild for age chronic microvascular ischemic changes and mild parenchymal volume loss of the brain. 4. No acute fracture or dislocation of cervical spine. 5. Cervical spondylosis greatest at C5-6 level. Electronically Signed   By: Kristine Garbe M.D.   On: 10/30/2017 17:03   Ct Cervical Spine Wo Contrast  Result Date: 10/30/2017 CLINICAL DATA:  83 y/o F; status post fall with laceration on the right-sided of patient's head. EXAM: CT HEAD WITHOUT CONTRAST CT CERVICAL SPINE WITHOUT CONTRAST TECHNIQUE: Multidetector CT imaging of the head and cervical spine was performed following the standard protocol without intravenous contrast. Multiplanar CT image reconstructions of the cervical spine were also generated. COMPARISON:  None. FINDINGS: CT HEAD FINDINGS Brain: No evidence of acute infarction, hemorrhage, hydrocephalus, extra-axial collection or mass lesion/mass effect. Mild chronic microvascular ischemic changes and parenchymal volume loss of the brain. Vascular: Calcific atherosclerosis of carotid siphons. No hyperdense vessel identified. Skull: Right parietal region small scalp contusion and laceration. No calvarial fracture. Sinuses/Orbits: No acute finding. Other: Bilateral intra-ocular  lens replacement. CT CERVICAL SPINE FINDINGS Alignment: Normal cervical lordosis.  Grade 1 C3-4 anterolisthesis. Skull base and vertebrae: No acute fracture. No primary bone lesion or focal pathologic process. Soft tissues and spinal canal: No prevertebral fluid or swelling. No visible canal hematoma. Disc levels: Cervical spondylosis with mild C3-4 and moderate severe C5-6 disc space narrowing. Mild left-greater-than-right facet hypertrophy. C5-6 disc osteophyte complex with mild canal stenosis. C5-6 uncovertebral and facet hypertrophy with left-greater-than-right foraminal encroachment. Upper chest: Negative. Other: Calcific atherosclerosis of carotid siphons. IMPRESSION: 1. Right parietal region small scalp contusion and laceration. 2. No calvarial fracture or acute intracranial abnormality identified. 3. Mild for age chronic microvascular ischemic changes and mild parenchymal volume loss of the brain. 4. No acute fracture or dislocation of cervical spine. 5. Cervical spondylosis greatest at C5-6 level. Electronically Signed   By: Kristine Garbe M.D.   On: 10/30/2017 17:03   Pelvis Portable  Result Date: 10/31/2017 CLINICAL DATA:  Postop right hip EXAM: PORTABLE PELVIS 1-2  VIEWS COMPARISON:  AP pelvis and right femur radiographs - 10/30/2017; intraoperative right hip radiographs - earlier same day FINDINGS: Single AP supine radiograph of the pelvis is provided for review Post right bipolar hip replacement. Alignment appears anatomic given solitary AP projection. No definite fracture. Minimal amount of adjacent subcutaneous emphysema and intra-articular air. Vascular calcifications overlie the right medial thigh. Skin staples overlie the operative site. No radiopaque foreign body. IMPRESSION: Post bipolar right hip replacement without evidence of complication. Electronically Signed   By: Sandi Mariscal M.D.   On: 10/31/2017 09:50   Dg Pelvis Portable  Result Date: 10/30/2017 CLINICAL DATA:  Status  post fall with right hip pain. EXAM: PORTABLE PELVIS 1-2 VIEWS COMPARISON:  None. FINDINGS: There is a sub capitellar fracture of the right femoral neck with foreshortening and mild superior migration of the femur. Mild osteopenia. No evidence of other pelvic or left hip fractures. IMPRESSION: Right femoral neck sub capitellar impacted fracture. Electronically Signed   By: Fidela Salisbury M.D.   On: 10/30/2017 16:35   Dg Chest Portable 1 View  Result Date: 10/30/2017 CLINICAL DATA:  Painful RIGHT hip EXAM: PORTABLE CHEST 1 VIEW COMPARISON:  None. FINDINGS: Normal mediastinum and cardiac silhouette. Normal pulmonary vasculature. No evidence of effusion, infiltrate, or pneumothorax. No acute bony abnormality. IMPRESSION: No acute cardiopulmonary process. Electronically Signed   By: Suzy Bouchard M.D.   On: 10/30/2017 16:36   Dg C-arm 1-60 Min  Result Date: 10/31/2017 CLINICAL DATA:  Right hip replacement EXAM: DG C-ARM 61-120 MIN; OPERATIVE RIGHT HIP WITH PELVIS COMPARISON:  Right femur radiographs-10/30/2017 FINDINGS: Three spot intraoperative fluoroscopic images of the right hip provided for review Images demonstrate the sequela of right bipolar hip replacement. Alignment appears anatomic given solitary AP projection. There is a minimal amount of expected subcutaneous emphysema about the operative site. No radiopaque foreign body. No definite fracture. IMPRESSION: Post right-sided bipolar hip replacement without evidence of complication. Electronically Signed   By: Sandi Mariscal M.D.   On: 10/31/2017 09:04   Dg Hip Operative Unilat With Pelvis Right  Result Date: 10/31/2017 CLINICAL DATA:  Right hip replacement EXAM: DG C-ARM 61-120 MIN; OPERATIVE RIGHT HIP WITH PELVIS COMPARISON:  Right femur radiographs-10/30/2017 FINDINGS: Three spot intraoperative fluoroscopic images of the right hip provided for review Images demonstrate the sequela of right bipolar hip replacement. Alignment appears  anatomic given solitary AP projection. There is a minimal amount of expected subcutaneous emphysema about the operative site. No radiopaque foreign body. No definite fracture. IMPRESSION: Post right-sided bipolar hip replacement without evidence of complication. Electronically Signed   By: Sandi Mariscal M.D.   On: 10/31/2017 09:04   Dg Femur Port, Min 2 Views Right  Result Date: 10/30/2017 CLINICAL DATA:  Right hip pain post fall. EXAM: RIGHT FEMUR PORTABLE 2 VIEW COMPARISON:  None. FINDINGS: Sub capitellar mildly impacted right femoral neck fracture with mild superior migration of the right femur. Evidence of dislocation. Mild osteopenia. IMPRESSION: Sub capitellar mildly impacted right femoral neck fracture. Electronically Signed   By: Fidela Salisbury M.D.   On: 10/30/2017 16:37      Scheduled Meds: . calcitonin (salmon)  1 spray Alternating Nares Daily  . cholecalciferol  1,000 Units Oral Daily  . docusate sodium  100 mg Oral BID  . donepezil  5 mg Oral QHS  . enoxaparin (LOVENOX) injection  40 mg Subcutaneous Q24H  . feeding supplement (ENSURE ENLIVE)  237 mL Oral BID BM  . ferrous sulfate  325 mg Oral  TID PC  . losartan  50 mg Oral Daily  . metoprolol succinate  50 mg Oral Daily  . polyethylene glycol  17 g Oral Daily  . simvastatin  20 mg Oral QHS   Continuous Infusions: . methocarbamol (ROBAXIN)  IV    . tranexamic acid (CYKLOKAPRON) topical -INTRAOP       LOS: 2 days    Time spent: 30 minutes   Dessa Phi, DO [facility] [email or web address] Password [facility] 11/01/2017, 12:53 PM

## 2017-11-01 NOTE — Evaluation (Signed)
Occupational Therapy Evaluation Patient Details Name: Amanda Salas MRN: [id number] DOB: 1927/03/07 Today's Date: 11/01/2017    History of Present Illness 83 yo female s/p R THA (anterior) 10/31/17, with past medical hx of osteoporosis, HTN, GERD, HLD, mild dementia   Clinical Impression   This 83 y/o F presents with the above. At baseline Pt is mod independent with ADLs and functional mobility. Pt presents with significant pain in RLE this session, impacting her static sitting balance and overall functional performance with ADLs and mobility, requiring ModA +2 for supine>sit and MaxA+2 to return to supine. Currently requires Max-total assist for LB ADLs. Pt will benefit from continued OT services to maximize Pt's safety and independence with ADLs and mobility. Pt/Pt's family wishing for Pt to return home with [facility] services as Pt's family is able to provide 24 hr assist, however pending Pt's progress, may need to consider SNF; will continue to follow.    Follow Up Recommendations  Home health OT;Supervision/Assistance - 24 hour(pending progress)    Equipment Recommendations  Other (comment)(defer to next venue )           Precautions / Restrictions Precautions Precautions: Fall Precaution Comments: R THA  Restrictions Weight Bearing Restrictions: Yes RLE Weight Bearing: Weight bearing as tolerated      Mobility Bed Mobility Overal bed mobility: Needs Assistance Bed Mobility: Supine to Sit;Sit to Supine     Supine to sit: Mod assist Sit to supine: Max assist;+2 for physical assistance;+2 for safety/equipment   General bed mobility comments: Pt requires assist to advance RLE and scoot hips towards EOB, assist to bring trunk upright; Pt not able to tolerate sitting up due to pain, required MaxA +2 to return to supine   Transfers                      Balance Overall balance assessment: Needs assistance Sitting-balance support: Single extremity supported;Feet  supported(single LE supported ) Sitting balance-Leahy Scale: Poor                                     ADL either performed or assessed with clinical judgement   ADL Overall ADL's : Needs assistance/impaired Eating/Feeding: Set up;Sitting   Grooming: Set up;Bed level;Sitting   Upper Body Bathing: Minimal assistance;Bed level   Lower Body Bathing: Maximal assistance;Bed level;Sitting/lateral leans   Upper Body Dressing : Minimal assistance;Sitting;Bed level   Lower Body Dressing: Maximal assistance;Bed level;+2 for safety/equipment;+2 for physical assistance;Sitting/lateral leans                 General ADL Comments: Pt significantly limited by pain this session, only able to transfer to EOB, requires +2 assist to maintain static sitting EOB, one person supporting trunk and additional assist supporting LE in extension, requires +2 to return to supine                          Pertinent Vitals/Pain Pain Assessment: Faces Faces Pain Scale: Hurts worst Pain Location: R Hip Pain Descriptors / Indicators: Operative site guarding;Moaning;Sharp;Aching Pain Intervention(s): Limited activity within patient's tolerance;Monitored during session;Premedicated before session;Ice applied          Extremity/Trunk Assessment Upper Extremity Assessment Upper Extremity Assessment: Generalized weakness   Lower Extremity Assessment Lower Extremity Assessment: Defer to PT evaluation       Communication Communication Communication: No difficulties   Cognition  Arousal/Alertness: Awake/alert Behavior During Therapy: WFL for tasks assessed/performed Overall Cognitive Status: Within Functional Limits for tasks assessed                                 General Comments: Pt endorses some memory issues (early dementia at baseline), though does not seem to interfere with functional task completion    General Comments  Pt's granddaughter present during  session                [place] expects to be discharged to:: Private residence Living Arrangements: Children;Other relatives(granddaughter ) Available Help at Discharge: Available 24 hours/day;Family Type of Home: House Home Access: Level entry     Home Layout: One level     Bathroom Shower/Tub: Occupational psychologist: Handicapped height Bathroom Accessibility: Yes   Home Equipment: Environmental consultant - 2 wheels;Bedside commode;Shower seat;Grab bars - toilet;Grab bars - tub/shower   Additional Comments: Pt lives in a handicap accessible apartment attached to her granddaughters home.       Prior Functioning/Environment Level of Independence: Independent with assistive device(s)        Comments: Pt was mod I with all mobility prior to fall. Sometimes used a walker but not often.         OT Problem List: Decreased strength;Impaired balance (sitting and/or standing);Pain;Decreased range of motion;Decreased activity tolerance;Decreased knowledge of use of DME or AE      OT Treatment/Interventions: Self-care/ADL training;DME and/or AE instruction;Therapeutic activities;Balance training;Therapeutic exercise;Energy conservation;Patient/family education    OT Goals(Current goals can be found in the care plan section) Acute Rehab OT Goals Patient Stated Goal: to return home with Home Health PT OT Goal Formulation: With patient Time For Goal Achievement: 11/15/17 Potential to Achieve Goals: Good  OT Frequency: Min 2X/week                             AM-PAC PT "6 Clicks" Daily Activity     Outcome Measure Help from another person eating meals?: None Help from another person taking care of personal grooming?: A Little Help from another person toileting, which includes using toliet, bedpan, or urinal?: A Lot Help from another person bathing (including washing, rinsing, drying)?: A Lot Help from another person to put on and taking off regular  upper body clothing?: A Little Help from another person to put on and taking off regular lower body clothing?: A Lot 6 Click Score: 16   End of Session Nurse Communication: Mobility status  Activity Tolerance: Patient limited by pain Patient left: in bed;with call bell/phone within reach;with nursing/sitter in room;with family/visitor present  OT Visit Diagnosis: History of falling (Z91.81);Pain;Other abnormalities of gait and mobility (R26.89);Muscle weakness (generalized) (M62.81) Pain - Right/Left: Right Pain - part of body: Hip                Time: 1555-1620 OT Time Calculation (min): 25 min Charges:  OT General Charges $OT Visit: 1 Visit OT Evaluation $OT Eval Moderate Complexity: 1 Mod G-Codes:     Lou Cal, OT Pager [phone] 11/01/2017   Raymondo Band 11/01/2017, 5:23 PM

## 2017-11-01 NOTE — Evaluation (Signed)
Physical Therapy Evaluation Patient Details Name: Amanda Salas MRN: [id number] DOB: Oct 10, 1927 Today's Date: 11/01/2017   History of Present Illness  83 yo female s/p R THA (anterior) 10/31/17, with past medical hx of osteoporosis, HTN, GERD, HLD, mild dementia  Clinical Impression  Patient is s/p above surgery resulting in functional limitations due to the deficits listed below (see PT Problem List). PTA, pt was mod I with all mobility and did not require assistance with ADLs. Pt lives in a handicap accessible attachment to her granddaughters home who does not work and can provide 24/7 care along with her daughter living under a mile away available to support as well. Today, pt presents with high levels of post op pain and weakness that limits her mobility and requires patient to receive assistance with bed mobility. Pt tolerating EOB balance and able to rise to standing with min A. Pt and family stated strong preference  for return to home with HHPT, I believe this is appropriate given pts presentation and level of support at home.   Patient will benefit from skilled PT to increase their independence and safety with mobility to allow discharge to the venue listed below.       Follow Up Recommendations Home health PT;DC plan and follow up therapy as arranged by surgeon;Supervision/Assistance - 24 hour    Equipment Recommendations  None recommended by PT    Recommendations for Other Services       Precautions / Restrictions Precautions Precautions: Fall Precaution Comments: L THA  Restrictions Weight Bearing Restrictions: Yes RLE Weight Bearing: Weight bearing as tolerated      Mobility  Bed Mobility Overal bed mobility: Needs Assistance Bed Mobility: Supine to Sit     Supine to sit: Mod assist     General bed mobility comments: Pt requires assist with RLE due to pain. able to scoot to EOB and tolerate sitting for 15 minutes  Transfers Overall transfer level: Needs  assistance   Transfers: Sit to/from Stand Sit to Stand: Min guard         General transfer comment: Cues for hand placement with walker, pt able to power up with min A.   Ambulation/Gait                Stairs            Wheelchair Mobility    Modified Rankin (Stroke Patients Only)       Balance Overall balance assessment: Needs assistance Sitting-balance support: Feet unsupported;No upper extremity supported Sitting balance-Leahy Scale: Fair     Standing balance support: Bilateral upper extremity supported Standing balance-Leahy Scale: Poor Standing balance comment: reliance of UE support for static balance                             Pertinent Vitals/Pain Pain Assessment: Faces Faces Pain Scale: Hurts worst Pain Location: R Hip Pain Descriptors / Indicators: Operative site guarding;Moaning;Sharp;Aching Pain Intervention(s): Limited activity within patient's tolerance;Monitored during session;Repositioned;Premedicated before session    Home Living Family/patient expects to be discharged to:: Private residence Living Arrangements: Children;Other relatives Available Help at Discharge: Available 24 hours/day;Family(Daughter lives with pt and does not work, mother lives close) Type of Home: House Home Access: Level entry     Home Layout: One [place]: Environmental consultant - 2 wheels;Bedside commode;Shower seat;Grab bars - toilet;Grab bars - tub/shower Additional Comments: Pt lives in a handicap accessible apartment attached to her daughters home.  Prior Function Level of Independence: Independent with assistive device(s)         Comments: Pt was mod I with all mobility prior to fall. Sometimes used a walker but not often.      Hand Dominance        Extremity/Trunk Assessment   Upper Extremity Assessment Upper Extremity Assessment: Overall WFL for tasks assessed    Lower Extremity Assessment Lower Extremity Assessment:  Generalized weakness(LLE strength 4/5, RLE 3-/5 due to post op pain and weakness)       Communication   Communication: No difficulties  Cognition Arousal/Alertness: Awake/alert Behavior During Therapy: WFL for tasks assessed/performed Overall Cognitive Status: Within Functional Limits for tasks assessed                                        General Comments General comments (skin integrity, edema, etc.): Increased time due to pain and guarding. Pt reporting nausea and dizziness at rest and with activity. VSS throughout session on RA. Extensive discussion and family education with daughter regarding d/c dispo and safety considerations for home.      Exercises General Exercises - Lower Extremity Ankle Circles/Pumps: AROM;Both;20 reps Long Arc Quad: AROM;Both;10 reps   Assessment/Plan    PT Assessment Patient needs continued PT services  PT Problem List Decreased strength;Decreased range of motion;Decreased activity tolerance;Decreased balance;Decreased mobility;Decreased cognition;Pain       PT Treatment Interventions DME instruction;Gait training;Functional mobility training;Therapeutic activities;Therapeutic exercise    PT Goals (Current goals can be found in the Care Plan section)  Acute Rehab PT Goals Patient Stated Goal: to return home with Home Health PT PT Goal Formulation: With patient/family Time For Goal Achievement: 11/15/17 Potential to Achieve Goals: Good    Frequency Min 5X/week   Barriers to discharge        Co-evaluation               AM-PAC PT "6 Clicks" Daily Activity  Outcome Measure Difficulty turning over in bed (including adjusting bedclothes, sheets and blankets)?: Unable Difficulty moving from lying on back to sitting on the side of the bed? : Unable Difficulty sitting down on and standing up from a chair with arms (e.g., wheelchair, bedside commode, etc,.)?: Unable Help needed moving to and from a bed to chair (including a  wheelchair)?: A Little Help needed walking in hospital room?: A Lot Help needed climbing 3-5 steps with a railing? : A Lot 6 Click Score: 10    End of Session   Activity Tolerance: Patient limited by pain Patient left: in bed;with call bell/phone within reach;with family/visitor present Nurse Communication: Mobility status PT Visit Diagnosis: Unsteadiness on feet (R26.81);Other abnormalities of gait and mobility (R26.89);Pain;Muscle weakness (generalized) (M62.81) Pain - Right/Left: Right Pain - part of body: Hip    Time: 0820-0910 PT Time Calculation (min) (ACUTE ONLY): 50 min   Charges:   PT Evaluation $PT Eval Moderate Complexity: 1 Mod PT Treatments $Therapeutic Activity: 8-22 mins $Self Care/Home Management: 8-22   PT G Codes:       Reinaldo Berber, PT, DPT Acute Rehab Services Pager: [phone]    Reinaldo Berber 11/01/2017, 9:52 AM

## 2017-11-02 LAB — BASIC METABOLIC PANEL
ANION GAP: 7 (ref 5–15)
BUN: 9 mg/dL (ref 6–20)
CALCIUM: 7.7 mg/dL — AB (ref 8.9–10.3)
CO2: 21 mmol/L — ABNORMAL LOW (ref 22–32)
CREATININE: 0.89 mg/dL (ref 0.44–1.00)
Chloride: 103 mmol/L (ref 101–111)
GFR, EST NON AFRICAN AMERICAN: 57 mL/min — AB (ref >60)
Glucose, Bld: 107 mg/dL — ABNORMAL HIGH (ref 65–99)
Potassium: 3.7 mmol/L (ref 3.5–5.1)
SODIUM: 131 mmol/L — AB (ref 135–145)

## 2017-11-02 LAB — URINALYSIS, ROUTINE W REFLEX MICROSCOPIC
BILIRUBIN URINE: NEGATIVE
GLUCOSE, UA: NEGATIVE mg/dL
HGB URINE DIPSTICK: NEGATIVE
Ketones, ur: NEGATIVE mg/dL
NITRITE: NEGATIVE
PH: 5 (ref 5.0–8.0)
Protein, ur: NEGATIVE mg/dL
SPECIFIC GRAVITY, URINE: 1.013 (ref 1.005–1.030)

## 2017-11-02 LAB — CBC
HEMATOCRIT: 24.8 % — AB (ref 36.0–46.0)
Hemoglobin: 8.3 g/dL — ABNORMAL LOW (ref 12.0–15.0)
MCH: 28.7 pg (ref 26.0–34.0)
MCHC: 33.5 g/dL (ref 30.0–36.0)
MCV: 85.8 fL (ref 78.0–100.0)
Platelets: 116 10*3/uL — ABNORMAL LOW (ref 150–400)
RBC: 2.89 MIL/uL — ABNORMAL LOW (ref 3.87–5.11)
RDW: 14.4 % (ref 11.5–15.5)
WBC: 8.7 10*3/uL (ref 4.0–10.5)

## 2017-11-02 NOTE — Progress Notes (Signed)
Physical Therapy Treatment Patient Details Name: Amanda Salas MRN: [id number] DOB: 06-14-1927 Today's Date: 11/02/2017    History of Present Illness 83 yo female s/p R THA (anterior) 10/31/17, with past medical hx of osteoporosis, HTN, GERD, HLD, mild dementia    PT Comments    Patient progressing towards goals. Pt demonstrating improved independence with bed mobility and transfers as well as increased activity tolerance. Pt still limited by pain and anxiety but able to perform stand pivot transfer with min guarding and BUE support. Discussed implications and safety considerations for d/c home with family who feel equip and eager to support patient 24/7 at home and receive home health PT. Will continue to follow to progress towards goals until medically cleared for d/c.     Follow Up Recommendations  Home health PT;DC plan and follow up therapy as arranged by surgeon;Supervision/Assistance - 24 hour     Equipment Recommendations  None recommended by PT    Recommendations for Other Services       Precautions / Restrictions Precautions Precautions: Fall Precaution Comments: R THA  Restrictions Weight Bearing Restrictions: No RLE Weight Bearing: Weight bearing as tolerated    Mobility  Bed Mobility Overal bed mobility: Needs Assistance Bed Mobility: Supine to Sit;Sit to Supine     Supine to sit: Min assist     General bed mobility comments: Pt requires min A with RLE limb advancement. Able to scoot herself to EOB   Transfers Overall transfer level: Needs assistance Equipment used: Rolling walker (2 wheeled) Transfers: Sit to/from [facility] Sit to Stand: Min assist Stand pivot transfers: Min guard       General transfer comment: extensive time due to guarding and pain. Cues for hand placement with walker, pt able to power up with min A.  Stand pivot with min guard into bedside chair.   Ambulation/Gait Ambulation/Gait assistance: Min  assist Ambulation Distance (Feet): 5 Feet Assistive device: Rolling walker (2 wheeled) Gait Pattern/deviations: Step-to pattern;Antalgic;Decreased stride length Gait velocity: decreased   General Gait Details: Cues for sequencing and use of RW. Pt does better with less instruction and more goal direction "move to the chair" invokes more mobility. Limited by anxiety of falling but has the strength and balance to ambulate short distances with RW and contact guard.    Stairs            Wheelchair Mobility    Modified Rankin (Stroke Patients Only)       Balance Overall balance assessment: Needs assistance Sitting-balance support: Single extremity supported;Feet supported Sitting balance-Leahy Scale: Fair     Standing balance support: Bilateral upper extremity supported Standing balance-Leahy Scale: Poor                              Cognition Arousal/Alertness: Awake/alert Behavior During Therapy: WFL for tasks assessed/performed Overall Cognitive Status: Within Functional Limits for tasks assessed                                        Exercises      General Comments General comments (skin integrity, edema, etc.): VSS throughout session. Daughter invovled in session, extensive conversation about home support, techinques, safety and rehab considerations in preperation for d/c home with home PT.       Pertinent Vitals/Pain Pain Assessment: Faces Faces Pain Scale: Hurts whole lot Pain  Location: R Hip Pain Descriptors / Indicators: Operative site guarding;Moaning;Sharp;Aching Pain Intervention(s): Limited activity within patient's tolerance;Monitored during session;Repositioned    Home Living                      Prior Function            PT Goals (current goals can now be found in the care plan section) Acute Rehab PT Goals Patient Stated Goal: to return home with Home Health PT PT Goal Formulation: With patient/family Time  For Goal Achievement: 11/15/17 Potential to Achieve Goals: Good Progress towards PT goals: Progressing toward goals    Frequency    Min 5X/week      PT Plan Current plan remains appropriate    Co-evaluation              AM-PAC PT "6 Clicks" Daily Activity  Outcome Measure  Difficulty turning over in bed (including adjusting bedclothes, sheets and blankets)?: Unable Difficulty moving from lying on back to sitting on the side of the bed? : Unable Difficulty sitting down on and standing up from a chair with arms (e.g., wheelchair, bedside commode, etc,.)?: Unable Help needed moving to and from a bed to chair (including a wheelchair)?: A Little Help needed walking in hospital room?: A Little Help needed climbing 3-5 steps with a railing? : Total 6 Click Score: 10    End of Session Equipment Utilized During Treatment: Gait belt Activity Tolerance: Patient limited by pain Patient left: in chair;with call bell/phone within reach;with family/visitor present Nurse Communication: Mobility status PT Visit Diagnosis: Unsteadiness on feet (R26.81);Other abnormalities of gait and mobility (R26.89);Pain;Muscle weakness (generalized) (M62.81) Pain - Right/Left: Right Pain - part of body: Hip     Time: 5456-2563 PT Time Calculation (min) (ACUTE ONLY): 56 min  Charges:  $Gait Training: 8-22 mins $Therapeutic Activity: 8-22 mins $Self Care/Home Management: 8-22                    G Codes:       Reinaldo Berber, PT, DPT Acute Rehab Services Pager: [phone]     Reinaldo Berber 11/02/2017, 11:40 AM

## 2017-11-02 NOTE — Progress Notes (Signed)
Pt voided 350 on bedside commode.

## 2017-11-02 NOTE — Progress Notes (Signed)
   Subjective:  Patient reports pain as mild.  Very alert.  Objective:   VITALS:   Vitals:   11/01/17 1015 11/01/17 1551 11/01/17 2041 11/02/17 0417  BP: (!) 134/51 (!) 121/40 (!) 160/56 (!) 146/51  Pulse:  69 86 75  Resp:  16 16 16   Temp:  100.1 F (37.8 C) (!) 101 F (38.3 C) 98.8 F (37.1 C)  TempSrc:  Oral Oral Oral  SpO2:  92% 96% 97%  Weight:      Height:        Neurologically intact Neurovascular intact Sensation intact distally Intact pulses distally Dorsiflexion/Plantar flexion intact Incision: dressing C/D/I and no drainage No cellulitis present Compartment soft   Lab Results  Component Value Date   WBC 8.7 11/02/2017   HGB 8.3 (L) 11/02/2017   HCT 24.8 (L) 11/02/2017   MCV 85.8 11/02/2017   PLT 116 (L) 11/02/2017     Assessment/Plan:  2 Days Post-Op   - no hip precautions - up with PT - stable from ortho stand point - f/u 2 weeks   Eduard Roux 11/02/2017, 7:37 AM [phone]

## 2017-11-02 NOTE — Progress Notes (Signed)
Pt still has not voided, bladder scan showed 250. Pt wants to try and sit on [facility] before we try the In and out cath.

## 2017-11-02 NOTE — Progress Notes (Signed)
PROGRESS NOTE    Amanda Salas  BOF:[id number] DOB: 24-Aug-1927 DOA: 10/30/2017 PCP: Abner Greenspan, MD     Brief Narrative:  Amanda Salas is a 83 yo female with past medical hx of osteoporosis, HTN, GERD, HLD, mild dementia. She presents to the hospital after a mechanical fall. Patient was working at her sink, when she tried to turn she lost her footing and fell on the ground on the right side.  She hit her head and had some bleeding.  She did not passed out denies having any dizziness lightheadedness was awake all throughout the episode. In the ED, she had a right scalp laceration repaired. Orthopedic surgery was consulted for her right hip fracture. She underwent right total hip arthroplasty 11/11.   Assessment & Plan:   Active Problems:   Hyperlipidemia   Essential hypertension   Osteoporosis   Memory loss   Compression fracture of thoracolumbar vertebra (HCC)   Anemia   Subcapital fracture of right hip (HCC)   Hyponatremia   Leucocytosis   Right closed subcapital hip fracture following mechanical fall -CT head and CT C-spine unremarkable for any acute fracture -Patient has sustained scalp laceration which will be repaired by EDP -X-ray tibia-fibula, x-ray pelvis shows no evidence of acute fracture on the right side -X-ray chest no evidence of rib fracture.  No acute abnormality as well -S/p right total hip arthroplasty 11/11. Follow up in 2 weeks for staple removal, 6 weeks with MD  -PT OT, can discharge with HHPT OT with family support   Post op fever -101 F last night. No new complaints. Incision site without erythema. Check UA and blood culture. No SOB or cough.   Mild dementia -Supportive care, continue aricept   CKD stage 3 -Stable   Normocytic anemia -Stable, expected postop blood loss   HTN -Resume home toprol, cozaar   DVT prophylaxis: lovenox Code Status: Full Family Communication: at bedside Disposition Plan: Home with [facility] OT     Consultants:   Orthopedic surgery  Procedures:   S/p right total hip arthroplasty 11/11  Antimicrobials:  Anti-infectives (From admission, onward)   Start     Dose/Rate Route Frequency Ordered Stop   10/31/17 1215  ceFAZolin (ANCEF) IVPB 2g/100 mL premix     2 g 200 mL/hr over 30 Minutes Intravenous Every 6 hours 10/31/17 1200 11/01/17 0113   10/31/17 0600  ceFAZolin (ANCEF) IVPB 2g/100 mL premix     2 g 200 mL/hr over 30 Minutes Intravenous To Short Stay 10/31/17 0352 10/31/17 0755       Subjective: Patient sitting in chair, just work with physical therapy.  States that she is tired but overall feeling well.  Had fever 101 overnight.  No new complaints aside from urinary retention.  Has not been able to void on her own.  No bowel movement yet.   Objective: Vitals:   11/01/17 1551 11/01/17 2041 11/02/17 0417 11/02/17 1330  BP: (!) 121/40 (!) 160/56 (!) 146/51 (!) 115/45  Pulse: 69 86 75 69  Resp: 16 16 16 16   Temp: 100.1 F (37.8 C) (!) 101 F (38.3 C) 98.8 F (37.1 C) 99.6 F (37.6 C)  TempSrc: Oral Oral Oral Oral  SpO2: 92% 96% 97% 98%  Weight:      Height:        Intake/Output Summary (Last 24 hours) at 11/02/2017 1416 Last data filed at 11/02/2017 0435 Gross per 24 hour  Intake 300 ml  Output 480 ml  Net -  180 ml   Filed Weights   10/30/17 1753  Weight: 56.7 kg (125 lb)    Examination:  General exam: Appears calm and comfortable  Respiratory system: Clear to auscultation. Respiratory effort normal. Cardiovascular system: S1 & S2 heard, RRR. No JVD, murmurs, rubs, gallops or clicks. No pedal edema. Gastrointestinal system: Abdomen is nondistended, soft and nontender. No organomegaly or masses felt. Normal bowel sounds heard. Central nervous system: Alert and oriented. Speech clear  Skin: No rashes, lesions or ulcers, right hip incision with clean dressing, no underlying erythema  Psychiatry: Judgement and insight appear normal. Mood & affect  appropriate.   Data Reviewed: I have personally reviewed following labs and imaging studies  CBC: Recent Labs  Lab 10/30/17 1534 10/30/17 2042 10/31/17 0352 11/01/17 0559 11/02/17 0601  WBC 14.3* 11.5* 8.0 8.6 8.7  NEUTROABS 12.4*  --  5.8  --   --   HGB 11.3* 10.4* 9.4* 8.4* 8.3*  HCT 35.1* 32.0* 28.8* 25.3* 24.8*  MCV 86.9 86.5 86.0 87.8 85.8  PLT 146* 133* 123* 94* 599*   Basic Metabolic Panel: Recent Labs  Lab 10/30/17 1534 10/31/17 0352 11/01/17 0559 11/02/17 0601  NA 134* 136 132* 131*  K 3.6 3.8 3.7 3.7  CL 105 108 105 103  CO2 23 21* 21* 21*  GLUCOSE 111* 103* 109* 107*  BUN 16 14 12 9   CREATININE 0.96 1.00 1.05* 0.89  CALCIUM 8.6* 7.9* 7.6* 7.7*  MG  --  1.8  --   --    GFR: Estimated Creatinine Clearance: 36.8 mL/min (by C-G formula based on SCr of 0.89 mg/dL). Liver Function Tests: Recent Labs  Lab 10/30/17 2042 10/31/17 0352  AST 23 21  ALT 18 14  ALKPHOS 51 42  BILITOT 0.7 0.7  PROT 5.8* 5.4*  ALBUMIN 3.3* 2.8*   No results for input(s): LIPASE, AMYLASE in the last 168 hours. No results for input(s): AMMONIA in the last 168 hours. Coagulation Profile: Recent Labs  Lab 10/30/17 2042  INR 1.07   Cardiac Enzymes: No results for input(s): CKTOTAL, CKMB, CKMBINDEX, TROPONINI in the last 168 hours. BNP (last 3 results) No results for input(s): PROBNP in the last 8760 hours. HbA1C: No results for input(s): HGBA1C in the last 72 hours. CBG: No results for input(s): GLUCAP in the last 168 hours. Lipid Profile: No results for input(s): CHOL, HDL, LDLCALC, TRIG, CHOLHDL, LDLDIRECT in the last 72 hours. Thyroid Function Tests: No results for input(s): TSH, T4TOTAL, FREET4, T3FREE, THYROIDAB in the last 72 hours. Anemia Panel: No results for input(s): VITAMINB12, FOLATE, FERRITIN, TIBC, IRON, RETICCTPCT in the last 72 hours. Sepsis Labs: No results for input(s): PROCALCITON, LATICACIDVEN in the last 168 hours.  Recent Results (from the past  240 hour(s))  Surgical pcr screen     Status: None   Collection Time: 10/30/17 11:53 PM  Result Value Ref Range Status   MRSA, PCR NEGATIVE NEGATIVE Final   Staphylococcus aureus NEGATIVE NEGATIVE Final    Comment: (NOTE) The Xpert SA Assay (FDA approved for NASAL specimens in patients 5 years of age and older), is one component of a comprehensive surveillance program. It is not intended to diagnose infection nor to guide or monitor treatment.        Radiology Studies: No results found.    Scheduled Meds: . calcitonin (salmon)  1 spray Alternating Nares Daily  . cholecalciferol  1,000 Units Oral Daily  . docusate sodium  100 mg Oral BID  . donepezil  5  mg Oral QHS  . enoxaparin (LOVENOX) injection  40 mg Subcutaneous Q24H  . feeding supplement (ENSURE ENLIVE)  237 mL Oral BID BM  . ferrous sulfate  325 mg Oral TID PC  . losartan  50 mg Oral Daily  . metoprolol succinate  50 mg Oral Daily  . polyethylene glycol  17 g Oral Daily  . simvastatin  20 mg Oral QHS   Continuous Infusions: . methocarbamol (ROBAXIN)  IV    . tranexamic acid (CYKLOKAPRON) topical -INTRAOP       LOS: 3 days    Time spent: 30 minutes   Dessa Phi, DO [facility] [email or web address] Password [email or web address] 11/02/2017, 2:16 PM

## 2017-11-02 NOTE — Progress Notes (Signed)
Occupational Therapy Treatment Patient Details Name: Amanda Salas MRN: [id number] DOB: 10/05/1927 Today's Date: 11/02/2017    History of present illness 83 yo female s/p R THA (anterior) 10/31/17, with past medical hx of osteoporosis, HTN, GERD, HLD, mild dementia   OT comments  Pt progressing towards goals, continues to demonstrate decreased functional performance due to pain and fatigue. Pt completed toileting with maxA, stand pivot from [facility] to recliner at [place] level with [place], requiring increased time and effort for mobility and functional task completion. Pt's family present during session and very supportive/involved in Pt's POC, wishing for Pt to return home (vs SNF). Pt will benefit from continued acute OT services for progression of ADL completion and mobility as well as to provide further family education regarding Pt care upon return home. Recommend home health services after discharge to maximize Pt's progression towards PLOF. Will continue to follow.    Follow Up Recommendations  Home health OT;Supervision/Assistance - 24 hour;Other (comment)(HH Aide )    Financial risk analyst (measurements OT);Wheelchair cushion (measurements OT)          Precautions / Restrictions Precautions Precautions: Fall Precaution Comments: R THA  Restrictions Weight Bearing Restrictions: No RLE Weight Bearing: Weight bearing as tolerated       Mobility Bed Mobility               General bed mobility comments: OOB upon arrival   Transfers Overall transfer level: Needs assistance Equipment used: Rolling walker (2 wheeled) Transfers: Sit to/from [facility] Sit to Stand: Mod assist Stand pivot transfers: Mod assist       General transfer comment: extensive time due to guarding and pain. Cues for hand placement with walker, assist to power up and to steady in standing; steadying assist and multimodal cues for pivot to transfer to recliner      Balance Overall balance assessment: Needs assistance         Standing balance support: Bilateral upper extremity supported Standing balance-Leahy Scale: Poor Standing balance comment: reliance of UE support for static balance                           ADL either performed or assessed with clinical judgement   ADL Overall ADL's : Needs assistance/impaired                 Upper Body Dressing : Minimal assistance;Sitting Upper Body Dressing Details (indicate cue type and reason): doffing/donning new gown      Toilet Transfer: Moderate assistance;Stand-pivot;BSC;RW Toilet Transfer Details (indicate cue type and reason): assisted with transfer back to recliner from [facility], requires increased time and cues to complete  Toileting- Clothing Manipulation and Hygiene: Maximal assistance;Sit to/from stand Toileting - Clothing Manipulation Details (indicate cue type and reason): assist for peri-care      Functional mobility during ADLs: Moderate assistance;Rolling walker General ADL Comments: Pt had just transferred to [facility] upon entering room; assist Pt with peri-care and transfer back to recliner; Pt requires increased time and effort secondary to fatigue and pain; Pt's family present and with questions regarding home (vs SNF); family wanting Pt to return home and very willing to provide assist needed, will continue to educate family on safety during ADLs/functional mobility completion in futher sessions                        Cognition Arousal/Alertness: Awake/alert Behavior During Therapy: [facility] for tasks assessed/performed  Overall Cognitive Status: Within Functional Limits for tasks assessed                                                            Pertinent Vitals/ Pain       Pain Assessment: Faces Faces Pain Scale: Hurts whole lot Pain Location: R Hip Pain Descriptors / Indicators: Operative site guarding;Moaning;Sharp;Aching Pain  Intervention(s): Limited activity within patient's tolerance;Monitored during session;Repositioned                                                          Frequency  Min 2X/week        Progress Toward Goals  OT Goals(current goals can now be found in the care plan section)  Progress towards OT goals: Progressing toward goals  Acute Rehab OT Goals Patient Stated Goal: to return home with Home Health therapy  OT Goal Formulation: With patient Time For Goal Achievement: 11/15/17 Potential to Achieve Goals: Good  Plan Discharge plan remains appropriate                     AM-PAC PT "6 Clicks" Daily Activity     Outcome Measure   Help from another person eating meals?: None Help from another person taking care of personal grooming?: A Little Help from another person toileting, which includes using toliet, bedpan, or urinal?: A Lot Help from another person bathing (including washing, rinsing, drying)?: A Lot Help from another person to put on and taking off regular upper body clothing?: A Little Help from another person to put on and taking off regular lower body clothing?: A Lot 6 Click Score: 16    End of Session Equipment Utilized During Treatment: Rolling walker  OT Visit Diagnosis: History of falling (Z91.81);Pain;Other abnormalities of gait and mobility (R26.89);Muscle weakness (generalized) (M62.81) Pain - Right/Left: Right Pain - part of body: Hip   Activity Tolerance Patient tolerated treatment well;Patient limited by pain   Patient Left with call bell/phone within reach;with family/visitor present;in chair   Nurse Communication Mobility status        Time: 6237-6283 OT Time Calculation (min): 39 min  Charges: OT General Charges $OT Visit: 1 Visit OT Treatments $Self Care/Home Management : 23-37 mins  Lou Cal, OT Pager [phone] 11/02/2017    Raymondo Band 11/02/2017, 3:37 PM

## 2017-11-03 LAB — BASIC METABOLIC PANEL
Anion gap: 4 — ABNORMAL LOW (ref 5–15)
BUN: 9 mg/dL (ref 6–20)
CALCIUM: 8.1 mg/dL — AB (ref 8.9–10.3)
CO2: 26 mmol/L (ref 22–32)
CREATININE: 0.78 mg/dL (ref 0.44–1.00)
Chloride: 104 mmol/L (ref 101–111)
GFR calc Af Amer: >60 mL/min (ref >60)
GLUCOSE: 98 mg/dL (ref 65–99)
Potassium: 3.6 mmol/L (ref 3.5–5.1)
Sodium: 134 mmol/L — ABNORMAL LOW (ref 135–145)

## 2017-11-03 LAB — CBC
HCT: 29.9 % — ABNORMAL LOW (ref 36.0–46.0)
HEMOGLOBIN: 9.9 g/dL — AB (ref 12.0–15.0)
MCH: 28.3 pg (ref 26.0–34.0)
MCHC: 33.1 g/dL (ref 30.0–36.0)
MCV: 85.4 fL (ref 78.0–100.0)
PLATELETS: 173 10*3/uL (ref 150–400)
RBC: 3.5 MIL/uL — AB (ref 3.87–5.11)
RDW: 14.3 % (ref 11.5–15.5)
WBC: 9.3 10*3/uL (ref 4.0–10.5)

## 2017-11-03 MED ORDER — POLYETHYLENE GLYCOL 3350 17 G PO PACK: 17.0000 g | PACK | Freq: Every day | ORAL | 0 refills | Status: DC | PRN

## 2017-11-03 MED ORDER — FERROUS SULFATE 325 (65 FE) MG PO TABS: 325.0000 mg | ORAL_TABLET | Freq: Two times a day (BID) | ORAL | Status: DC

## 2017-11-03 NOTE — Discharge Summary (Signed)
Physician Discharge Summary  Amanda Salas MEQ:[id number] DOB: 04-20-1927 DOA: 10/30/2017  PCP: Abner Greenspan, MD  Admit date: 10/30/2017 Discharge date: 11/03/2017  Time spent: 45 minutes  Recommendations for Outpatient Follow-up:  1. Ortho Dr.Xu in 2weeks 2. Continue Lovenox for DVT prophylaxis for 4weeks   Discharge Diagnoses:    Right Hip fracture   Dementia   Hyperlipidemia   Essential hypertension   Osteoporosis   Memory loss   Compression fracture of thoracolumbar vertebra (HCC)   Anemia   Hyponatremia   Leucocytosis   Discharge Condition: stable  Diet recommendation: heart healthy  Filed Weights   10/30/17 1753  Weight: 56.7 kg (125 lb)    History of present illness:  Amanda Salas is a 83 yo female with past medical hx of osteoporosis, HTN, GERD, HLD, mild dementia. She presents to the hospital after a mechanical fall. Patient was working at her sink, when she tried to turn she lost her footing and fell on the ground on the right side. She hit her head and had some bleeding.   Hospital Course:   Right closed subcapital hip fracture following mechanical fall -CT head and CT C-spine unremarkable for any acute fracture -Patient has sustained scalp laceration which will be repaired by EDP -orthopedics consulted -S/p right total hip arthroplasty 11/11. Follow up in 2 weeks for staple removal, FU with Dr.Xu -Started on Lovenox daily for DVT prophylaxis will need to continue this for 4 weeks postop  Post op fever -likely due to atelectasis, resolved -No fever or leukocytosis for about 48 hours now  Mild dementia -Supportive care, continue aricept   CKD stage 3 -Stable   Normocytic anemia -Stable, mild expected postop blood loss   HTN -Resume home toprol, cozaar  Consultants:   Orthopedic surgery  Procedures:   S/p right total hip arthroplasty 11/11  Discharge Exam: Vitals:   11/02/17 1330 11/03/17 0437  BP: (!) 115/45 (!)  127/52  Pulse: 69 72  Resp: 16 16  Temp: 99.6 F (37.6 C) 98.5 F (36.9 C)  SpO2: 98% 98%    General: AAOx2, pleasantly confused Cardiovascular: S1S2/RRR Respiratory: CTAB  Discharge Instructions   Discharge Instructions    Diet - low sodium heart healthy   Complete by:  As directed    Increase activity slowly   Complete by:  As directed    Weight bearing as tolerated   Complete by:  As directed      Current Discharge Medication List    START taking these medications   Details  enoxaparin (LOVENOX) 30 MG/0.3ML injection Inject 0.3 mLs (30 mg total) daily into the skin. Qty: 14 Syringe, Refills: 0    ferrous sulfate 325 (65 FE) MG tablet Take 1 tablet (325 mg total) 2 (two) times daily with a meal by mouth.    HYDROcodone-acetaminophen (NORCO) 7.5-325 MG tablet Take 1-2 tablets every 6 (six) hours as needed by mouth for moderate pain. Qty: 30 tablet, Refills: 0    polyethylene glycol (MIRALAX / GLYCOLAX) packet Take 17 g daily as needed by mouth. Qty: 14 each, Refills: 0      CONTINUE these medications which have NOT CHANGED   Details  acetaminophen (TYLENOL) 500 MG tablet Take 500 mg every 6 (six) hours as needed by mouth for mild pain or headache.    calcitonin, salmon, (MIACALCIN/FORTICAL) 200 UNIT/ACT nasal spray INHALE 1 SPRAY INTO ALTERNATE NOSTRILS DAILY Qty: 3.7 mL, Refills: 3    Cholecalciferol (VITAMIN D3 PO) Take 1,000  Units daily by mouth.     donepezil (ARICEPT) 5 MG tablet Take 5 mg at bedtime by mouth.    FLUZONE HIGH-DOSE 0.5 ML injection 1 each once.    losartan (COZAAR) 50 MG tablet Take 1 tablet (50 mg total) by mouth daily. Qty: 90 tablet, Refills: 1    metoprolol succinate (TOPROL-XL) 50 MG 24 hr tablet TAKE 1 TABLET BY MOUTH DAILY WITH OR IMMEDIATELY FOLLOWING A MEAL Qty: 90 tablet, Refills: 3    raloxifene (EVISTA) 60 MG tablet Take 1 tablet (60 mg total) by mouth daily. Qty: 90 tablet, Refills: 1    simvastatin (ZOCOR) 20 MG tablet  Take 1 tablet (20 mg total) by mouth at bedtime. Qty: 90 tablet, Refills: 3       Allergies  Allergen Reactions  . Ace Inhibitors     REACTION: cough  . Alendronate Sodium     REACTION: pill got stuck   Follow-up Information    Leandrew Koyanagi, MD Follow up in 2 week(s).   Specialty:  Orthopedic Surgery Why:  For suture removal, For wound re-check Contact information: [place] [phone] [phone]        Home, [facility] Follow up.   Specialty:  Home Health Services Why:  Home Health Physical Therapy, RN, aide, Occupational Therapy-agency will call to arrange initial visit Contact information: [address] [place] [place] [address] [phone]            The results of significant diagnostics from this hospitalization (including imaging, microbiology, ancillary and laboratory) are listed below for reference.    Significant Diagnostic Studies: Dg Tibia/fibula Right  Result Date: 10/30/2017 CLINICAL DATA:  Pain status post fall. EXAM: RIGHT TIBIA AND FIBULA - 2 VIEW COMPARISON:  None. FINDINGS: There is no evidence of fracture or other focal bone lesions. Mild osteoarthritic changes of the medial and patellofemoral compartment and moderate osteoarthritic changes of the lateral compartment of the knee joint. Soft tissues are unremarkable. IMPRESSION: No acute fracture or dislocation identified about the right Knee. Three compartment osteoarthritic changes worst in the lateral compartment. Electronically Signed   By: Fidela Salisbury M.D.   On: 10/30/2017 16:36   Ct Head Wo Contrast  Result Date: 10/30/2017 CLINICAL DATA:  83 y/o F; status post fall with laceration on the right-sided of patient's head. EXAM: CT HEAD WITHOUT CONTRAST CT CERVICAL SPINE WITHOUT CONTRAST TECHNIQUE: Multidetector CT imaging of the head and cervical spine was performed following the standard protocol without intravenous contrast. Multiplanar CT image  reconstructions of the cervical spine were also generated. COMPARISON:  None. FINDINGS: CT HEAD FINDINGS Brain: No evidence of acute infarction, hemorrhage, hydrocephalus, extra-axial collection or mass lesion/mass effect. Mild chronic microvascular ischemic changes and parenchymal volume loss of the brain. Vascular: Calcific atherosclerosis of carotid siphons. No hyperdense vessel identified. Skull: Right parietal region small scalp contusion and laceration. No calvarial fracture. Sinuses/Orbits: No acute finding. Other: Bilateral intra-ocular lens replacement. CT CERVICAL SPINE FINDINGS Alignment: Normal cervical lordosis.  Grade 1 C3-4 anterolisthesis. Skull base and vertebrae: No acute fracture. No primary bone lesion or focal pathologic process. Soft tissues and spinal canal: No prevertebral fluid or swelling. No visible canal hematoma. Disc levels: Cervical spondylosis with mild C3-4 and moderate severe C5-6 disc space narrowing. Mild left-greater-than-right facet hypertrophy. C5-6 disc osteophyte complex with mild canal stenosis. C5-6 uncovertebral and facet hypertrophy with left-greater-than-right foraminal encroachment. Upper chest: Negative. Other: Calcific atherosclerosis of carotid siphons. IMPRESSION: 1. Right parietal  region small scalp contusion and laceration. 2. No calvarial fracture or acute intracranial abnormality identified. 3. Mild for age chronic microvascular ischemic changes and mild parenchymal volume loss of the brain. 4. No acute fracture or dislocation of cervical spine. 5. Cervical spondylosis greatest at C5-6 level. Electronically Signed   By: Kristine Garbe M.D.   On: 10/30/2017 17:03   Ct Cervical Spine Wo Contrast  Result Date: 10/30/2017 CLINICAL DATA:  83 y/o F; status post fall with laceration on the right-sided of patient's head. EXAM: CT HEAD WITHOUT CONTRAST CT CERVICAL SPINE WITHOUT CONTRAST TECHNIQUE: Multidetector CT imaging of the head and cervical spine was  performed following the standard protocol without intravenous contrast. Multiplanar CT image reconstructions of the cervical spine were also generated. COMPARISON:  None. FINDINGS: CT HEAD FINDINGS Brain: No evidence of acute infarction, hemorrhage, hydrocephalus, extra-axial collection or mass lesion/mass effect. Mild chronic microvascular ischemic changes and parenchymal volume loss of the brain. Vascular: Calcific atherosclerosis of carotid siphons. No hyperdense vessel identified. Skull: Right parietal region small scalp contusion and laceration. No calvarial fracture. Sinuses/Orbits: No acute finding. Other: Bilateral intra-ocular lens replacement. CT CERVICAL SPINE FINDINGS Alignment: Normal cervical lordosis.  Grade 1 C3-4 anterolisthesis. Skull base and vertebrae: No acute fracture. No primary bone lesion or focal pathologic process. Soft tissues and spinal canal: No prevertebral fluid or swelling. No visible canal hematoma. Disc levels: Cervical spondylosis with mild C3-4 and moderate severe C5-6 disc space narrowing. Mild left-greater-than-right facet hypertrophy. C5-6 disc osteophyte complex with mild canal stenosis. C5-6 uncovertebral and facet hypertrophy with left-greater-than-right foraminal encroachment. Upper chest: Negative. Other: Calcific atherosclerosis of carotid siphons. IMPRESSION: 1. Right parietal region small scalp contusion and laceration. 2. No calvarial fracture or acute intracranial abnormality identified. 3. Mild for age chronic microvascular ischemic changes and mild parenchymal volume loss of the brain. 4. No acute fracture or dislocation of cervical spine. 5. Cervical spondylosis greatest at C5-6 level. Electronically Signed   By: Kristine Garbe M.D.   On: 10/30/2017 17:03   Pelvis Portable  Result Date: 10/31/2017 CLINICAL DATA:  Postop right hip EXAM: PORTABLE PELVIS 1-2 VIEWS COMPARISON:  AP pelvis and right femur radiographs - 10/30/2017; intraoperative right hip  radiographs - earlier same day FINDINGS: Single AP supine radiograph of the pelvis is provided for review Post right bipolar hip replacement. Alignment appears anatomic given solitary AP projection. No definite fracture. Minimal amount of adjacent subcutaneous emphysema and intra-articular air. Vascular calcifications overlie the right medial thigh. Skin staples overlie the operative site. No radiopaque foreign body. IMPRESSION: Post bipolar right hip replacement without evidence of complication. Electronically Signed   By: Sandi Mariscal M.D.   On: 10/31/2017 09:50   Dg Pelvis Portable  Result Date: 10/30/2017 CLINICAL DATA:  Status post fall with right hip pain. EXAM: PORTABLE PELVIS 1-2 VIEWS COMPARISON:  None. FINDINGS: There is a sub capitellar fracture of the right femoral neck with foreshortening and mild superior migration of the femur. Mild osteopenia. No evidence of other pelvic or left hip fractures. IMPRESSION: Right femoral neck sub capitellar impacted fracture. Electronically Signed   By: Fidela Salisbury M.D.   On: 10/30/2017 16:35   Dg Chest Portable 1 View  Result Date: 10/30/2017 CLINICAL DATA:  Painful RIGHT hip EXAM: PORTABLE CHEST 1 VIEW COMPARISON:  None. FINDINGS: Normal mediastinum and cardiac silhouette. Normal pulmonary vasculature. No evidence of effusion, infiltrate, or pneumothorax. No acute bony abnormality. IMPRESSION: No acute cardiopulmonary process. Electronically Signed   By: Helane Gunther.D.  On: 10/30/2017 16:36   Dg C-arm 1-60 Min  Result Date: 10/31/2017 CLINICAL DATA:  Right hip replacement EXAM: DG C-ARM 61-120 MIN; OPERATIVE RIGHT HIP WITH PELVIS COMPARISON:  Right femur radiographs-10/30/2017 FINDINGS: Three spot intraoperative fluoroscopic images of the right hip provided for review Images demonstrate the sequela of right bipolar hip replacement. Alignment appears anatomic given solitary AP projection. There is a minimal amount of expected subcutaneous  emphysema about the operative site. No radiopaque foreign body. No definite fracture. IMPRESSION: Post right-sided bipolar hip replacement without evidence of complication. Electronically Signed   By: Sandi Mariscal M.D.   On: 10/31/2017 09:04   Dg Hip Operative Unilat With Pelvis Right  Result Date: 10/31/2017 CLINICAL DATA:  Right hip replacement EXAM: DG C-ARM 61-120 MIN; OPERATIVE RIGHT HIP WITH PELVIS COMPARISON:  Right femur radiographs-10/30/2017 FINDINGS: Three spot intraoperative fluoroscopic images of the right hip provided for review Images demonstrate the sequela of right bipolar hip replacement. Alignment appears anatomic given solitary AP projection. There is a minimal amount of expected subcutaneous emphysema about the operative site. No radiopaque foreign body. No definite fracture. IMPRESSION: Post right-sided bipolar hip replacement without evidence of complication. Electronically Signed   By: Sandi Mariscal M.D.   On: 10/31/2017 09:04   Dg Femur Port, Min 2 Views Right  Result Date: 10/30/2017 CLINICAL DATA:  Right hip pain post fall. EXAM: RIGHT FEMUR PORTABLE 2 VIEW COMPARISON:  None. FINDINGS: Sub capitellar mildly impacted right femoral neck fracture with mild superior migration of the right femur. Evidence of dislocation. Mild osteopenia. IMPRESSION: Sub capitellar mildly impacted right femoral neck fracture. Electronically Signed   By: Fidela Salisbury M.D.   On: 10/30/2017 16:37    Microbiology: Recent Results (from the past 240 hour(s))  Surgical pcr screen     Status: None   Collection Time: 10/30/17 11:53 PM  Result Value Ref Range Status   MRSA, PCR NEGATIVE NEGATIVE Final   Staphylococcus aureus NEGATIVE NEGATIVE Final    Comment: (NOTE) The Xpert SA Assay (FDA approved for NASAL specimens in patients 64 years of age and older), is one component of a comprehensive surveillance program. It is not intended to diagnose infection nor to guide or monitor treatment.    Culture, blood (routine x 2)     Status: None (Preliminary result)   Collection Time: 11/02/17 11:25 AM  Result Value Ref Range Status   Specimen Description BLOOD RIGHT HAND  Final   Special Requests IN PEDIATRIC BOTTLE Blood Culture adequate volume  Final   Culture NO GROWTH < 24 HOURS  Final   Report Status PENDING  Incomplete  Culture, blood (routine x 2)     Status: None (Preliminary result)   Collection Time: 11/02/17 11:30 AM  Result Value Ref Range Status   Specimen Description BLOOD RIGHT HAND  Final   Special Requests IN PEDIATRIC BOTTLE Blood Culture adequate volume  Final   Culture NO GROWTH < 24 HOURS  Final   Report Status PENDING  Incomplete     Labs: Basic Metabolic Panel: Recent Labs  Lab 10/30/17 1534 10/31/17 0352 11/01/17 0559 11/02/17 0601 11/03/17 0413  NA 134* 136 132* 131* 134*  K 3.6 3.8 3.7 3.7 3.6  CL 105 108 105 103 104  CO2 23 21* 21* 21* 26  GLUCOSE 111* 103* 109* 107* 98  BUN 16 14 12 9 9   CREATININE 0.96 1.00 1.05* 0.89 0.78  CALCIUM 8.6* 7.9* 7.6* 7.7* 8.1*  MG  --  1.8  --   --   --  Liver Function Tests: Recent Labs  Lab 10/30/17 2042 10/31/17 0352  AST 23 21  ALT 18 14  ALKPHOS 51 42  BILITOT 0.7 0.7  PROT 5.8* 5.4*  ALBUMIN 3.3* 2.8*   No results for input(s): LIPASE, AMYLASE in the last 168 hours. No results for input(s): AMMONIA in the last 168 hours. CBC: Recent Labs  Lab 10/30/17 1534 10/30/17 2042 10/31/17 0352 11/01/17 0559 11/02/17 0601 11/03/17 0413  WBC 14.3* 11.5* 8.0 8.6 8.7 9.3  NEUTROABS 12.4*  --  5.8  --   --   --   HGB 11.3* 10.4* 9.4* 8.4* 8.3* 9.9*  HCT 35.1* 32.0* 28.8* 25.3* 24.8* 29.9*  MCV 86.9 86.5 86.0 87.8 85.8 85.4  PLT 146* 133* 123* 94* 116* 173   Cardiac Enzymes: No results for input(s): CKTOTAL, CKMB, CKMBINDEX, TROPONINI in the last 168 hours. BNP: BNP (last 3 results) No results for input(s): BNP in the last 8760 hours.  ProBNP (last 3 results) No results for input(s):  PROBNP in the last 8760 hours.  CBG: No results for input(s): GLUCAP in the last 168 hours.     SignedDomenic Polite MD.  [facility] 11/03/2017, 2:36 PM

## 2017-11-03 NOTE — Progress Notes (Signed)
Physical Therapy Treatment Patient Details Name: Amanda Salas MRN: [id number] DOB: 1927-07-09 Today's Date: 11/03/2017    History of Present Illness 82 yo female s/p R THA (anterior) 10/31/17, with past medical hx of osteoporosis, HTN, GERD, HLD, mild dementia    PT Comments    Session focused on improving level of independence with transfers in and out of bed into char/bedside commodes and family education. Pt and family feel more comfortable with transfers and guarding than prior sessions. Extensive discussion with family/caretakers regarding implications for home discharge with home therapies, family is committed to d/c home and I feel this is safe. Tomorrow session will focus on further education with thereex and ensuring there are no more questions or concerns from family.   Home health PT;DC plan and follow up therapy as arranged by surgeon;Supervision/Assistance - 24 hour     Equipment Recommendations  None recommended by PT    Recommendations for Other Services       Precautions / Restrictions Precautions Precautions: Fall Precaution Comments: R THA  Restrictions Weight Bearing Restrictions: No RLE Weight Bearing: Weight bearing as tolerated    Mobility  Bed Mobility Overal bed mobility: Needs Assistance Bed Mobility: Sit to Supine       Sit to supine: Min assist;+2 for physical assistance   General bed mobility comments: assist to advance RLE towards EOB and use of chuck pad to scoot to EOB   Transfers Overall transfer level: Needs assistance Equipment used: Rolling walker (2 wheeled) Transfers: Sit to/from [facility] Sit to Stand: Min assist Stand pivot transfers: Mod assist       General transfer comment: min guard-min A to rise from chair to walker. able to stand pivot into bed with min guarding and UE support on walker.  Ambulation/Gait Ambulation/Gait assistance: Min assist Ambulation Distance (Feet): 5 Feet Assistive device:  Rolling walker (2 wheeled) Gait Pattern/deviations: Step-to pattern;Antalgic;Decreased stride length Gait velocity: decreased   General Gait Details: Pt limited by fear and pain, able to ambulate without assistance with RW but encourged heavily throughout ambulation to advance surgical limb. no reports of increase pain throughout    Stairs            Wheelchair Mobility    Modified Rankin (Stroke Patients Only)       Balance Overall balance assessment: Needs assistance Sitting-balance support: Single extremity supported;Feet supported Sitting balance-Leahy Scale: Fair Sitting balance - Comments: static sitting EOB    Standing balance support: Bilateral upper extremity supported;No upper extremity supported Standing balance-Leahy Scale: Poor Standing balance comment: reliance of UE support/external support from therapist for static balance (improvements noted from previous session)                            Cognition Arousal/Alertness: Awake/alert Behavior During Therapy: WFL for tasks assessed/performed Overall Cognitive Status: History of cognitive impairments - at baseline                                 General Comments: Pt endorses some memory issues (early dementia at baseline), though does not seem to interfere with functional task completion       Exercises General Exercises - Lower Extremity Ankle Circles/Pumps: AROM;Both;20 reps Long Arc Quad: AROM;Both;10 reps    General Comments General comments (skin integrity, edema, etc.): Session focused on family education and discharge recomendations, implications for home discharge with home  PT vs SNF.       Pertinent Vitals/Pain Pain Assessment: Faces Faces Pain Scale: Hurts even more Pain Location: R Hip with mobility  Pain Descriptors / Indicators: Operative site guarding;Moaning;Sharp;Aching Pain Intervention(s): Limited activity within patient's tolerance;Monitored during  session;Repositioned    Home Living                      Prior Function            PT Goals (current goals can now be found in the care plan section) Acute Rehab PT Goals Patient Stated Goal: to return home with Home Health therapy  PT Goal Formulation: With patient/family Time For Goal Achievement: 11/15/17 Potential to Achieve Goals: Good Progress towards PT goals: Progressing toward goals    Frequency    Min 5X/week      PT Plan Current plan remains appropriate    Co-evaluation              AM-PAC PT "6 Clicks" Daily Activity  Outcome Measure  Difficulty turning over in bed (including adjusting bedclothes, sheets and blankets)?: Unable Difficulty moving from lying on back to sitting on the side of the bed? : Unable Difficulty sitting down on and standing up from a chair with arms (e.g., wheelchair, bedside commode, etc,.)?: A Little Help needed moving to and from a bed to chair (including a wheelchair)?: A Little Help needed walking in hospital room?: A Little Help needed climbing 3-5 steps with a railing? : A Lot 6 Click Score: 13    End of Session Equipment Utilized During Treatment: Gait belt Activity Tolerance: Patient limited by pain Patient left: with call bell/phone within reach;with family/visitor present;in bed Nurse Communication: Mobility status PT Visit Diagnosis: Unsteadiness on feet (R26.81);Other abnormalities of gait and mobility (R26.89);Pain;Muscle weakness (generalized) (M62.81) Pain - Right/Left: Right Pain - part of body: Hip     Time: 1630-1705 PT Time Calculation (min) (ACUTE ONLY): 35 min  Charges:  $Therapeutic Activity: 8-22 mins $Self Care/Home Management: 8-22                    G Codes:       Reinaldo Berber, PT, DPT Acute Rehab Services Pager: [phone]     Reinaldo Berber 11/03/2017, 5:25 PM

## 2017-11-03 NOTE — NC FL2 (Signed)
[place] LEVEL OF CARE SCREENING TOOL     IDENTIFICATION  Patient Name: Amanda Salas Birthdate: 1927/03/31 Sex: female Admission Date (Current Location): 10/30/2017  [facility] [place] and [place] Number:  Engineering geologist and Address:  [facility] [place] [place] [address], [place], [place] [address]      Provider Number: [id number]  Attending Physician Name and Address:  Domenic Polite, MD  Relative Name and Phone Number:  [name], daughter, [phone]    Current Level of Care: Hospital Recommended Level of Care: [place] Prior Approval Number:    Date Approved/Denied:   PASRR Number: [id number]  Discharge Plan: SNF    Current Diagnoses: Patient Active Problem List   Diagnosis Date Noted  . Subcapital fracture of right hip ([place]) 10/30/2017  . Hyponatremia 10/30/2017  . Leucocytosis 10/30/2017  . Skin lesion 05/27/2016  . Dystrophic nail 04/23/2015  . Encounter for Medicare annual wellness exam 04/17/2014  . Colon cancer screening 04/17/2014  . Anemia 04/17/2014  . Compression fracture of thoracolumbar vertebra ([place]) 12/26/2013  . Low back pain 11/22/2013  . Contact dermatitis 11/04/2011  . 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 Memory loss 12/29/2010  . Vitamin D deficiency 07/01/2010  . FALL, HX OF 07/01/2010  . Hyperlipidemia 08/10/2008  . THROMBOCYTOPENIA, CHRONIC 08/10/2008  . Essential hypertension 08/10/2008  . ALLERGIC RHINITIS 08/10/2008  . GERD 08/10/2008  . Osteoporosis 08/10/2008    Orientation RESPIRATION BLADDER Height & Weight     Self, Time, Situation, Place  Normal Continent, Indwelling catheter Weight: 125 lb (56.7 kg) Height:  5\' 3"  (160 cm)  BEHAVIORAL SYMPTOMS/MOOD NEUROLOGICAL BOWEL NUTRITION STATUS      Continent Diet(See DC Summary)  AMBULATORY STATUS COMMUNICATION OF NEEDS Skin   Extensive Assist Verbally Surgical wounds                       Personal Care Assistance  Level of Assistance  Bathing, Dressing, Feeding Bathing Assistance: Maximum assistance Feeding assistance: Limited assistance Dressing Assistance: Maximum assistance     Functional Limitations Info  Sight, Hearing, Speech Sight Info: Adequate Hearing Info: Adequate Speech Info: Adequate    SPECIAL CARE FACTORS FREQUENCY  PT (By licensed PT), OT (By licensed OT)     PT Frequency: 5x week OT Frequency: 5x week            Contractures Contractures Info: Not present    Additional Factors Info  Code Status, Allergies Code Status Info: Full Code Allergies Info: ACE INHIBITORS, ALENDRONATE SODIUM            Current Medications (11/03/2017):  This is the current hospital active medication list Current Facility-Administered Medications  Medication Dose Route Frequency Provider Last Rate Last Dose  . acetaminophen (TYLENOL) tablet 650 mg  650 mg Oral Q6H PRN Leandrew Koyanagi, MD   650 mg at 11/03/17 7209   Or  . acetaminophen (TYLENOL) suppository 650 mg  650 mg Rectal Q6H PRN Leandrew Koyanagi, MD      . alum & mag hydroxide-simeth (MAALOX/MYLANTA) 200-200-20 MG/5ML suspension 30 mL  30 mL Oral Q4H PRN Leandrew Koyanagi, MD      . calcitonin (salmon) (MIACALCIN/FORTICAL) nasal spray 1 spray  1 spray Alternating Nares Daily Lavina Hamman, MD   1 spray at 11/02/17 0932  . cholecalciferol (VITAMIN D) tablet 1,000 Units  1,000 Units Oral Daily Lavina Hamman, MD   1,000 Units at 11/03/17 1033  .  docusate sodium (COLACE) capsule 100 mg  100 mg Oral BID Lavina Hamman, MD   100 mg at 11/03/17 1033  . donepezil (ARICEPT) tablet 5 mg  5 mg Oral QHS Lavina Hamman, MD   5 mg at 11/02/17 2203  . enoxaparin (LOVENOX) injection 40 mg  40 mg Subcutaneous Q24H Leandrew Koyanagi, MD   40 mg at 11/03/17 1033  . feeding supplement (ENSURE ENLIVE) (ENSURE ENLIVE) liquid 237 mL  237 mL Oral BID BM Dessa Phi, DO   237 mL at 11/02/17 1256  . ferrous sulfate tablet 325 mg  325 mg Oral TID PC Lavina Hamman, MD   325 mg at 11/03/17 1033  . HYDROcodone-acetaminophen (NORCO/VICODIN) 5-325 MG per tablet 1-2 tablet  1-2 tablet Oral Q6H PRN Leandrew Koyanagi, MD   2 tablet at 11/02/17 0432  . losartan (COZAAR) tablet 50 mg  50 mg Oral Daily Dessa Phi, DO   50 mg at 11/03/17 1033  . menthol-cetylpyridinium (CEPACOL) lozenge 3 mg  1 lozenge Oral PRN Leandrew Koyanagi, MD       Or  . phenol (CHLORASEPTIC) mouth spray 1 spray  1 spray Mouth/Throat PRN Leandrew Koyanagi, MD      . methocarbamol (ROBAXIN) tablet 500 mg  500 mg Oral Q6H PRN Leandrew Koyanagi, MD   500 mg at 11/03/17 0506   Or  . methocarbamol (ROBAXIN) 500 mg in dextrose 5 % 50 mL IVPB  500 mg Intravenous Q6H PRN Leandrew Koyanagi, MD      . metoCLOPramide (REGLAN) tablet 5-10 mg  5-10 mg Oral Q8H PRN Leandrew Koyanagi, MD       Or  . metoCLOPramide (REGLAN) injection 5-10 mg  5-10 mg Intravenous Q8H PRN Leandrew Koyanagi, MD      . metoprolol succinate (TOPROL-XL) 24 hr tablet 50 mg  50 mg Oral Daily Leandrew Koyanagi, MD   50 mg at 11/03/17 1033  . morphine 4 MG/ML injection 2 mg  2 mg Intravenous Q4H PRN Lavina Hamman, MD   2 mg at 10/31/17 1214  . ondansetron (ZOFRAN) tablet 4 mg  4 mg Oral Q6H PRN Leandrew Koyanagi, MD       Or  . ondansetron [facility]) injection 4 mg  4 mg Intravenous Q6H PRN Leandrew Koyanagi, MD   4 mg at 11/01/17 0804  . polyethylene glycol (MIRALAX / GLYCOLAX) packet 17 g  17 g Oral Daily Lavina Hamman, MD   17 g at 11/02/17 0929  . simvastatin (ZOCOR) tablet 20 mg  20 mg Oral QHS Lavina Hamman, MD   20 mg at 11/02/17 2203  . tranexamic acid (CYKLOKAPRON) 2,000 mg in sodium chloride 0.9 % 50 mL Topical Application  6,761 mg Topical Once Leandrew Koyanagi, MD         Discharge Medications: Please see discharge summary for a list of discharge medications.  Relevant Imaging Results:  Relevant Lab Results:   Additional Information SS#:[id number] [place], LCSW

## 2017-11-03 NOTE — Care Management Note (Signed)
Case Management Note  Patient Details  Name: Amanda Salas MRN: [id number] Date of Birth: [birth year]  Subjective/Objective:                    Action/Plan: Case manager spoke with patient's daughter [name] concerning discharge plan.Choice for Home Health agency was offered, referral was called to Christa See, [facility]. [name] says that family Amanda provide support at discharge. The option of SNF was discussed and family really wants patient home.    Expected Discharge Date:   11/04/17               Expected Discharge Plan:  [place]  In-House Referral:  NA  Discharge planning Services  CM Consult  Post Acute Care Choice:  Home Health Choice offered to:  Adult Children  DME Arranged:  N/A DME Agency:  NA  HH Arranged:  PT, RN, OT, Nurse's Aide [place] Agency:  [facility] [employer]  Status of Service:  Completed, signed off  If discussed at [employer], dates discussed:    Additional Comments:  Ninfa Meeker, RN 11/03/2017, 3:10 PM

## 2017-11-03 NOTE — Progress Notes (Signed)
Occupational Therapy Treatment Patient Details Name: Amanda Salas MRN: [id number] DOB: [birth year] Today's Date: 11/03/2017    History of present illness 83 yo female s/p R THA (anterior) 10/31/17, with past medical hx of osteoporosis, HTN, GERD, HLD, mild dementia   OT comments  Pt progressing towards goals, continues to require increased time and multimodal cues for mobility, however increased stability noted this session, with Pt completing stand pivot EOB>BSC>recliner with min-modA throughout at RW level. Pt completed toileting this session with minsteady assist provided in standing during completion of pericare. Further discussion held with Pt/Pt's family regarding home vs SNF level therapy after discharge. Pt/Pt's family currently wishing to return home. Continue to recommend [facility] services for progression of Pt's safety and independence with ADLs and mobility. Will continue to follow acutely to progress Pt towards established OT goals as well as to provide continued caregiver education in preparation for discharge home.    Follow Up Recommendations  Home health OT;Supervision/Assistance - 24 hour;Other (comment)(HH Aide )    Equipment Recommendations  Other (comment)(declining w/c at this time, other DME needs are met )          Precautions / Restrictions Precautions Precautions: Fall Precaution Comments: R THA  Restrictions Weight Bearing Restrictions: No RLE Weight Bearing: Weight bearing as tolerated       Mobility Bed Mobility Overal bed mobility: Needs Assistance Bed Mobility: Supine to Sit           General bed mobility comments: assist to advance RLE towards EOB and use of chuck pad to scoot to EOB   Transfers Overall transfer level: Needs assistance Equipment used: Rolling walker (2 wheeled) Transfers: Sit to/from [facility] Sit to Stand: Mod assist;Min assist Stand pivot transfers: Mod assist       General transfer comment: ModA to  rise from EOB, MinA from [facility]; Pt requires increased time and multimodal cues to take small pivot steps during transfer     Balance Overall balance assessment: Needs assistance Sitting-balance support: Single extremity supported;Feet supported Sitting balance-Leahy Scale: Fair Sitting balance - Comments: static sitting EOB    Standing balance support: Bilateral upper extremity supported;No upper extremity supported Standing balance-Leahy Scale: Poor Standing balance comment: reliance of UE support/external support from therapist for static balance (improvements noted from previous session)                           ADL either performed or assessed with clinical judgement   ADL Overall ADL's : Needs assistance/impaired     Grooming: Set up;Sitting;Wash/dry face                   Toilet Transfer: Moderate assistance;Stand-pivot;BSC;RW Toilet Transfer Details (indicate cue type and reason): increased time and multimodal cues to complete transfer  Toileting- Clothing Manipulation and Hygiene: Minimal assistance Toileting - Clothing Manipulation Details (indicate cue type and reason): Pt able to complete peri-care after voiding bladder in standing with Min steadying assist provided      Functional mobility during ADLs: Moderate assistance;Rolling walker General ADL Comments: Pt requires increased time and multimodal cues to advance LEs during mobility, however Pt with increased stability in standing this session                         Cognition Arousal/Alertness: Awake/alert Behavior During Therapy: WFL for tasks assessed/performed Overall Cognitive Status: History of cognitive impairments - at baseline  General Comments VSS during session; Pt's daughter and granddaughter present and continued conversation held about [facility] therapy services vs SNF     Pertinent Vitals/ Pain       Pain  Assessment: Faces Faces Pain Scale: Hurts even more Pain Location: R Hip with mobility  Pain Descriptors / Indicators: Operative site guarding;Moaning;Sharp;Aching Pain Intervention(s): Monitored during session;Limited activity within patient's tolerance;Repositioned                                                          Frequency  Min 3X/week        Progress Toward Goals  OT Goals(current goals can now be found in the care plan section)  Progress towards OT goals: Progressing toward goals  Acute Rehab OT Goals Patient Stated Goal: to return home with Home Health therapy  OT Goal Formulation: With patient Time For Goal Achievement: 11/15/17 Potential to Achieve Goals: Good  Plan Discharge plan remains appropriate                    AM-PAC PT "6 Clicks" Daily Activity     Outcome Measure   Help from another person eating meals?: None Help from another person taking care of personal grooming?: A Little Help from another person toileting, which includes using toliet, bedpan, or urinal?: A Lot Help from another person bathing (including washing, rinsing, drying)?: A Lot Help from another person to put on and taking off regular upper body clothing?: A Little Help from another person to put on and taking off regular lower body clothing?: A Lot 6 Click Score: 16    End of Session Equipment Utilized During Treatment: Rolling walker;Gait belt  OT Visit Diagnosis: History of falling (Z91.81);Pain;Other abnormalities of gait and mobility (R26.89);Muscle weakness (generalized) (M62.81) Pain - Right/Left: Right Pain - part of body: Hip   Activity Tolerance Patient tolerated treatment well   Patient Left with call bell/phone within reach;with family/visitor present;in chair   Nurse Communication Mobility status        Time: 1435-1530 OT Time Calculation (min): 55 min  Charges: OT General Charges $OT Visit: 1 Visit OT Treatments $Self  Care/Home Management : 38-52 mins  Lou Cal, OT Pager [phone] 11/03/2017    Raymondo Band 11/03/2017, 4:59 PM

## 2017-11-03 NOTE — Care Management Important Message (Signed)
Important Message  Patient Details  Name: Amanda Salas MRN: [id number] Date of Birth: [birth year]   Medicare Important Message Given:  Yes    Foch Rosenwald [address] 11/03/2017, 12:46 PM

## 2017-11-03 NOTE — Clinical Social Work Note (Signed)
Clinical Social Work Assessment  Patient Details  Name: Amanda Salas MRN: [id number] Date of Birth: 1927-01-28  Date of referral:  11/03/17               Reason for consult:  Facility Placement                Permission sought to share information with:  Chartered certified accountant granted to share information::  Yes, Verbal Permission Granted  Name::     Amanda Salas  Agency::  SNF  Relationship::  daughter  Contact Information:     Housing/Transportation Living arrangements for the past 2 months:  Single Family Home Source of Information:  Patient, Adult Children Patient Interpreter Needed:  None Criminal Activity/Legal Involvement Pertinent to Current Situation/Hospitalization:  No - Comment as needed Significant Relationships:  Adult Children, Other Family Members Lives with:  Self Do you feel safe going back to the place where you live?  No Need for family participation in patient care:  Yes (Comment)  Care giving concerns:  Pt resides alone. Family wants patient to go home however, they are amenable to SNF placement. Pt has family support however, they may not be able to care for her given new impairment.  Social Worker assessment / plan:  CSW was advised that patient will consider [facility] at the [place] [place] if patient unable to return home. CSW will f/u as insurance auth will need to be ascertained for any SNF placement.  CSW called [facility] at the [place] and left message for admissions staff to review and see if they can offer bed. CSW will continue to follow up on SNF placement.  Employment status:  Retired Nurse, adult PT Recommendations:  [place] / Referral to community resources:  [place]  Patient/Family's Response to care:  Psychologist, prison and probation services of CSW assistance with SNF placement. No issues or concerns identified.    Patient/Family's Understanding of and Emotional Response to Diagnosis, Current Treatment, and Prognosis:  Patient has good understanding of diagnosis, current treatment plan and prognosis. Pt/family desires patient to return home, however it really depends on outcome of therapy recommendations. CSW will f/u as warranted. No issues or concerns identified.  Emotional Assessment Appearance:  Appears stated age Attitude/Demeanor/Rapport:  (Cooperative) Affect (typically observed):  Accepting, Appropriate Orientation:  Oriented to Situation, Oriented to  Time, Oriented to Place, Oriented to Self Alcohol / Substance use:  Not Applicable Psych involvement (Current and /or in the community):  No (Comment)  Discharge Needs  Concerns to be addressed:  Care Coordination Readmission within the last 30 days:  No Current discharge risk:  Physical Impairment, Dependent with Mobility Barriers to Discharge:  No Barriers Identified   Amanda Baxter, LCSW 11/03/2017, 12:06 PM

## 2017-11-04 NOTE — Progress Notes (Signed)
Discharge instructions and medications reviewed with patient's daughter and grandaughter. Verbalized understanding. Nurse educated on giving Lovenox injections, daughter demonstrated and verbalized understanding. Iv removed, catheter tip intact. No questions/concerns at this time. Awaiting wheelchair transport to lobby for discharge.

## 2017-11-05 ENCOUNTER — Telehealth: Payer: Self-pay | Admitting: Family Medicine

## 2017-11-05 DIAGNOSIS — M80051D Age-related osteoporosis with current pathological fracture, right femur, subsequent encounter for fracture with routine healing: Secondary | ICD-10-CM | POA: Diagnosis not present

## 2017-11-05 DIAGNOSIS — N183 Chronic kidney disease, stage 3 (moderate): Secondary | ICD-10-CM | POA: Diagnosis not present

## 2017-11-05 DIAGNOSIS — I129 Hypertensive chronic kidney disease with stage 1 through stage 4 chronic kidney disease, or unspecified chronic kidney disease: Secondary | ICD-10-CM | POA: Diagnosis not present

## 2017-11-05 DIAGNOSIS — M1991 Primary osteoarthritis, unspecified site: Secondary | ICD-10-CM | POA: Diagnosis not present

## 2017-11-05 DIAGNOSIS — Z96641 Presence of right artificial hip joint: Secondary | ICD-10-CM | POA: Diagnosis not present

## 2017-11-05 DIAGNOSIS — F0390 Unspecified dementia without behavioral disturbance: Secondary | ICD-10-CM | POA: Diagnosis not present

## 2017-11-05 NOTE — Telephone Encounter (Signed)
Please ok that verbal order  

## 2017-11-05 NOTE — Telephone Encounter (Signed)
Copied from [place] [id number]. Topic: Quick Communication - See Telephone Encounter >> Nov 05, 2017  1:03 PM Boyd Kerbs wrote: CRM for notification. See Telephone encounter for:  Amanda Salas with [facility] asking Verbal Order for skilled nurse,  PTOT to evaluate and recommend treatment, a home health aide ADL assistance, and medical social worker for community resources.  [phone]  11/05/17.

## 2017-11-07 DIAGNOSIS — Z96641 Presence of right artificial hip joint: Secondary | ICD-10-CM | POA: Diagnosis not present

## 2017-11-07 DIAGNOSIS — M1991 Primary osteoarthritis, unspecified site: Secondary | ICD-10-CM | POA: Diagnosis not present

## 2017-11-07 DIAGNOSIS — N183 Chronic kidney disease, stage 3 (moderate): Secondary | ICD-10-CM | POA: Diagnosis not present

## 2017-11-07 DIAGNOSIS — I129 Hypertensive chronic kidney disease with stage 1 through stage 4 chronic kidney disease, or unspecified chronic kidney disease: Secondary | ICD-10-CM | POA: Diagnosis not present

## 2017-11-07 DIAGNOSIS — F0390 Unspecified dementia without behavioral disturbance: Secondary | ICD-10-CM | POA: Diagnosis not present

## 2017-11-07 DIAGNOSIS — M80051D Age-related osteoporosis with current pathological fracture, right femur, subsequent encounter for fracture with routine healing: Secondary | ICD-10-CM | POA: Diagnosis not present

## 2017-11-07 LAB — CULTURE, BLOOD (ROUTINE X 2)
CULTURE: NO GROWTH
Culture: NO GROWTH
SPECIAL REQUESTS: ADEQUATE
SPECIAL REQUESTS: ADEQUATE

## 2017-11-08 NOTE — Telephone Encounter (Signed)
Verbal orders given to [facility]

## 2017-11-09 DIAGNOSIS — I129 Hypertensive chronic kidney disease with stage 1 through stage 4 chronic kidney disease, or unspecified chronic kidney disease: Secondary | ICD-10-CM | POA: Diagnosis not present

## 2017-11-09 DIAGNOSIS — M80051D Age-related osteoporosis with current pathological fracture, right femur, subsequent encounter for fracture with routine healing: Secondary | ICD-10-CM | POA: Diagnosis not present

## 2017-11-09 DIAGNOSIS — F0390 Unspecified dementia without behavioral disturbance: Secondary | ICD-10-CM | POA: Diagnosis not present

## 2017-11-09 DIAGNOSIS — M1991 Primary osteoarthritis, unspecified site: Secondary | ICD-10-CM | POA: Diagnosis not present

## 2017-11-09 DIAGNOSIS — N183 Chronic kidney disease, stage 3 (moderate): Secondary | ICD-10-CM | POA: Diagnosis not present

## 2017-11-09 DIAGNOSIS — Z96641 Presence of right artificial hip joint: Secondary | ICD-10-CM | POA: Diagnosis not present

## 2017-11-10 ENCOUNTER — Telehealth: Payer: Self-pay | Admitting: Family Medicine

## 2017-11-10 DIAGNOSIS — M80051D Age-related osteoporosis with current pathological fracture, right femur, subsequent encounter for fracture with routine healing: Secondary | ICD-10-CM | POA: Diagnosis not present

## 2017-11-10 DIAGNOSIS — I129 Hypertensive chronic kidney disease with stage 1 through stage 4 chronic kidney disease, or unspecified chronic kidney disease: Secondary | ICD-10-CM | POA: Diagnosis not present

## 2017-11-10 DIAGNOSIS — M1991 Primary osteoarthritis, unspecified site: Secondary | ICD-10-CM | POA: Diagnosis not present

## 2017-11-10 DIAGNOSIS — Z96641 Presence of right artificial hip joint: Secondary | ICD-10-CM | POA: Diagnosis not present

## 2017-11-10 DIAGNOSIS — F0390 Unspecified dementia without behavioral disturbance: Secondary | ICD-10-CM | POA: Diagnosis not present

## 2017-11-10 DIAGNOSIS — N183 Chronic kidney disease, stage 3 (moderate): Secondary | ICD-10-CM | POA: Diagnosis not present

## 2017-11-10 NOTE — Telephone Encounter (Signed)
Contacted by Education officer, museum, Catalina Lunger from [facility] to request more services at home for Amanda Salas. She is faxing over the form for services. She is stating that Ms. Walkins needs assistance with ADL after a recent fall to help her and her family.

## 2017-11-12 DIAGNOSIS — M1991 Primary osteoarthritis, unspecified site: Secondary | ICD-10-CM | POA: Diagnosis not present

## 2017-11-12 DIAGNOSIS — N183 Chronic kidney disease, stage 3 (moderate): Secondary | ICD-10-CM | POA: Diagnosis not present

## 2017-11-12 DIAGNOSIS — I129 Hypertensive chronic kidney disease with stage 1 through stage 4 chronic kidney disease, or unspecified chronic kidney disease: Secondary | ICD-10-CM | POA: Diagnosis not present

## 2017-11-12 DIAGNOSIS — M80051D Age-related osteoporosis with current pathological fracture, right femur, subsequent encounter for fracture with routine healing: Secondary | ICD-10-CM | POA: Diagnosis not present

## 2017-11-12 DIAGNOSIS — Z96641 Presence of right artificial hip joint: Secondary | ICD-10-CM | POA: Diagnosis not present

## 2017-11-12 DIAGNOSIS — F0390 Unspecified dementia without behavioral disturbance: Secondary | ICD-10-CM | POA: Diagnosis not present

## 2017-11-14 ENCOUNTER — Encounter: Payer: Self-pay | Admitting: Family Medicine

## 2017-11-15 ENCOUNTER — Encounter (INDEPENDENT_AMBULATORY_CARE_PROVIDER_SITE_OTHER): Payer: Self-pay | Admitting: Orthopaedic Surgery

## 2017-11-15 ENCOUNTER — Ambulatory Visit (INDEPENDENT_AMBULATORY_CARE_PROVIDER_SITE_OTHER): Payer: Medicare HMO

## 2017-11-15 ENCOUNTER — Ambulatory Visit (INDEPENDENT_AMBULATORY_CARE_PROVIDER_SITE_OTHER): Payer: Medicare HMO | Admitting: Orthopaedic Surgery

## 2017-11-15 DIAGNOSIS — S72011D Unspecified intracapsular fracture of right femur, subsequent encounter for closed fracture with routine healing: Secondary | ICD-10-CM

## 2017-11-15 NOTE — Progress Notes (Signed)
Patient is two-week status post right partial hip replacement for femoral neck fracture.  She is overall doing well and does not complain of significant pain.  Surgical incision is healed without signs of infection.  Leg lengths are equal.  X-rays show stable partial hip replacement in good alignment.  At this point continue with physical therapy for strengthening mobilization.  Follow-up in 4 weeks with standing AP pelvis x-ray if possible.

## 2017-11-16 DIAGNOSIS — M80051D Age-related osteoporosis with current pathological fracture, right femur, subsequent encounter for fracture with routine healing: Secondary | ICD-10-CM | POA: Diagnosis not present

## 2017-11-16 DIAGNOSIS — I129 Hypertensive chronic kidney disease with stage 1 through stage 4 chronic kidney disease, or unspecified chronic kidney disease: Secondary | ICD-10-CM | POA: Diagnosis not present

## 2017-11-16 DIAGNOSIS — Z96641 Presence of right artificial hip joint: Secondary | ICD-10-CM | POA: Diagnosis not present

## 2017-11-16 DIAGNOSIS — N183 Chronic kidney disease, stage 3 (moderate): Secondary | ICD-10-CM | POA: Diagnosis not present

## 2017-11-16 DIAGNOSIS — M1991 Primary osteoarthritis, unspecified site: Secondary | ICD-10-CM | POA: Diagnosis not present

## 2017-11-16 DIAGNOSIS — F0390 Unspecified dementia without behavioral disturbance: Secondary | ICD-10-CM | POA: Diagnosis not present

## 2017-11-17 DIAGNOSIS — F0390 Unspecified dementia without behavioral disturbance: Secondary | ICD-10-CM | POA: Diagnosis not present

## 2017-11-17 DIAGNOSIS — N183 Chronic kidney disease, stage 3 (moderate): Secondary | ICD-10-CM | POA: Diagnosis not present

## 2017-11-17 DIAGNOSIS — M80051D Age-related osteoporosis with current pathological fracture, right femur, subsequent encounter for fracture with routine healing: Secondary | ICD-10-CM | POA: Diagnosis not present

## 2017-11-17 DIAGNOSIS — M1991 Primary osteoarthritis, unspecified site: Secondary | ICD-10-CM | POA: Diagnosis not present

## 2017-11-17 DIAGNOSIS — Z96641 Presence of right artificial hip joint: Secondary | ICD-10-CM | POA: Diagnosis not present

## 2017-11-17 DIAGNOSIS — I129 Hypertensive chronic kidney disease with stage 1 through stage 4 chronic kidney disease, or unspecified chronic kidney disease: Secondary | ICD-10-CM | POA: Diagnosis not present

## 2017-11-18 DIAGNOSIS — F0390 Unspecified dementia without behavioral disturbance: Secondary | ICD-10-CM | POA: Diagnosis not present

## 2017-11-18 DIAGNOSIS — Z96641 Presence of right artificial hip joint: Secondary | ICD-10-CM | POA: Diagnosis not present

## 2017-11-18 DIAGNOSIS — M1991 Primary osteoarthritis, unspecified site: Secondary | ICD-10-CM | POA: Diagnosis not present

## 2017-11-18 DIAGNOSIS — I129 Hypertensive chronic kidney disease with stage 1 through stage 4 chronic kidney disease, or unspecified chronic kidney disease: Secondary | ICD-10-CM | POA: Diagnosis not present

## 2017-11-18 DIAGNOSIS — M80051D Age-related osteoporosis with current pathological fracture, right femur, subsequent encounter for fracture with routine healing: Secondary | ICD-10-CM | POA: Diagnosis not present

## 2017-11-18 DIAGNOSIS — N183 Chronic kidney disease, stage 3 (moderate): Secondary | ICD-10-CM | POA: Diagnosis not present

## 2017-11-19 DIAGNOSIS — I129 Hypertensive chronic kidney disease with stage 1 through stage 4 chronic kidney disease, or unspecified chronic kidney disease: Secondary | ICD-10-CM | POA: Diagnosis not present

## 2017-11-19 DIAGNOSIS — M1991 Primary osteoarthritis, unspecified site: Secondary | ICD-10-CM | POA: Diagnosis not present

## 2017-11-19 DIAGNOSIS — F0390 Unspecified dementia without behavioral disturbance: Secondary | ICD-10-CM | POA: Diagnosis not present

## 2017-11-19 DIAGNOSIS — M80051D Age-related osteoporosis with current pathological fracture, right femur, subsequent encounter for fracture with routine healing: Secondary | ICD-10-CM | POA: Diagnosis not present

## 2017-11-19 DIAGNOSIS — Z96641 Presence of right artificial hip joint: Secondary | ICD-10-CM | POA: Diagnosis not present

## 2017-11-19 DIAGNOSIS — N183 Chronic kidney disease, stage 3 (moderate): Secondary | ICD-10-CM | POA: Diagnosis not present

## 2017-11-23 DIAGNOSIS — N183 Chronic kidney disease, stage 3 (moderate): Secondary | ICD-10-CM | POA: Diagnosis not present

## 2017-11-23 DIAGNOSIS — I129 Hypertensive chronic kidney disease with stage 1 through stage 4 chronic kidney disease, or unspecified chronic kidney disease: Secondary | ICD-10-CM | POA: Diagnosis not present

## 2017-11-23 DIAGNOSIS — M1991 Primary osteoarthritis, unspecified site: Secondary | ICD-10-CM | POA: Diagnosis not present

## 2017-11-23 DIAGNOSIS — F0390 Unspecified dementia without behavioral disturbance: Secondary | ICD-10-CM | POA: Diagnosis not present

## 2017-11-23 DIAGNOSIS — Z96641 Presence of right artificial hip joint: Secondary | ICD-10-CM | POA: Diagnosis not present

## 2017-11-23 DIAGNOSIS — M80051D Age-related osteoporosis with current pathological fracture, right femur, subsequent encounter for fracture with routine healing: Secondary | ICD-10-CM | POA: Diagnosis not present

## 2017-11-24 DIAGNOSIS — M1991 Primary osteoarthritis, unspecified site: Secondary | ICD-10-CM | POA: Diagnosis not present

## 2017-11-24 DIAGNOSIS — Z96641 Presence of right artificial hip joint: Secondary | ICD-10-CM | POA: Diagnosis not present

## 2017-11-24 DIAGNOSIS — M80051D Age-related osteoporosis with current pathological fracture, right femur, subsequent encounter for fracture with routine healing: Secondary | ICD-10-CM | POA: Diagnosis not present

## 2017-11-24 DIAGNOSIS — I129 Hypertensive chronic kidney disease with stage 1 through stage 4 chronic kidney disease, or unspecified chronic kidney disease: Secondary | ICD-10-CM | POA: Diagnosis not present

## 2017-11-24 DIAGNOSIS — N183 Chronic kidney disease, stage 3 (moderate): Secondary | ICD-10-CM | POA: Diagnosis not present

## 2017-11-24 DIAGNOSIS — F0390 Unspecified dementia without behavioral disturbance: Secondary | ICD-10-CM | POA: Diagnosis not present

## 2017-11-25 DIAGNOSIS — N183 Chronic kidney disease, stage 3 (moderate): Secondary | ICD-10-CM | POA: Diagnosis not present

## 2017-11-25 DIAGNOSIS — M1991 Primary osteoarthritis, unspecified site: Secondary | ICD-10-CM | POA: Diagnosis not present

## 2017-11-25 DIAGNOSIS — M80051D Age-related osteoporosis with current pathological fracture, right femur, subsequent encounter for fracture with routine healing: Secondary | ICD-10-CM | POA: Diagnosis not present

## 2017-11-25 DIAGNOSIS — F0390 Unspecified dementia without behavioral disturbance: Secondary | ICD-10-CM | POA: Diagnosis not present

## 2017-11-25 DIAGNOSIS — Z96641 Presence of right artificial hip joint: Secondary | ICD-10-CM | POA: Diagnosis not present

## 2017-11-25 DIAGNOSIS — I129 Hypertensive chronic kidney disease with stage 1 through stage 4 chronic kidney disease, or unspecified chronic kidney disease: Secondary | ICD-10-CM | POA: Diagnosis not present

## 2017-11-26 DIAGNOSIS — I129 Hypertensive chronic kidney disease with stage 1 through stage 4 chronic kidney disease, or unspecified chronic kidney disease: Secondary | ICD-10-CM | POA: Diagnosis not present

## 2017-11-26 DIAGNOSIS — M1991 Primary osteoarthritis, unspecified site: Secondary | ICD-10-CM | POA: Diagnosis not present

## 2017-11-26 DIAGNOSIS — F0390 Unspecified dementia without behavioral disturbance: Secondary | ICD-10-CM | POA: Diagnosis not present

## 2017-11-26 DIAGNOSIS — Z96641 Presence of right artificial hip joint: Secondary | ICD-10-CM | POA: Diagnosis not present

## 2017-11-26 DIAGNOSIS — N183 Chronic kidney disease, stage 3 (moderate): Secondary | ICD-10-CM | POA: Diagnosis not present

## 2017-11-26 DIAGNOSIS — M80051D Age-related osteoporosis with current pathological fracture, right femur, subsequent encounter for fracture with routine healing: Secondary | ICD-10-CM | POA: Diagnosis not present

## 2017-12-01 DIAGNOSIS — M80051D Age-related osteoporosis with current pathological fracture, right femur, subsequent encounter for fracture with routine healing: Secondary | ICD-10-CM | POA: Diagnosis not present

## 2017-12-01 DIAGNOSIS — I129 Hypertensive chronic kidney disease with stage 1 through stage 4 chronic kidney disease, or unspecified chronic kidney disease: Secondary | ICD-10-CM | POA: Diagnosis not present

## 2017-12-01 DIAGNOSIS — M1991 Primary osteoarthritis, unspecified site: Secondary | ICD-10-CM | POA: Diagnosis not present

## 2017-12-01 DIAGNOSIS — F0390 Unspecified dementia without behavioral disturbance: Secondary | ICD-10-CM | POA: Diagnosis not present

## 2017-12-01 DIAGNOSIS — Z96641 Presence of right artificial hip joint: Secondary | ICD-10-CM | POA: Diagnosis not present

## 2017-12-01 DIAGNOSIS — N183 Chronic kidney disease, stage 3 (moderate): Secondary | ICD-10-CM | POA: Diagnosis not present

## 2017-12-02 DIAGNOSIS — M1991 Primary osteoarthritis, unspecified site: Secondary | ICD-10-CM | POA: Diagnosis not present

## 2017-12-02 DIAGNOSIS — I129 Hypertensive chronic kidney disease with stage 1 through stage 4 chronic kidney disease, or unspecified chronic kidney disease: Secondary | ICD-10-CM | POA: Diagnosis not present

## 2017-12-02 DIAGNOSIS — M80051D Age-related osteoporosis with current pathological fracture, right femur, subsequent encounter for fracture with routine healing: Secondary | ICD-10-CM | POA: Diagnosis not present

## 2017-12-02 DIAGNOSIS — F0390 Unspecified dementia without behavioral disturbance: Secondary | ICD-10-CM | POA: Diagnosis not present

## 2017-12-02 DIAGNOSIS — N183 Chronic kidney disease, stage 3 (moderate): Secondary | ICD-10-CM | POA: Diagnosis not present

## 2017-12-02 DIAGNOSIS — Z96641 Presence of right artificial hip joint: Secondary | ICD-10-CM | POA: Diagnosis not present

## 2017-12-03 DIAGNOSIS — I129 Hypertensive chronic kidney disease with stage 1 through stage 4 chronic kidney disease, or unspecified chronic kidney disease: Secondary | ICD-10-CM | POA: Diagnosis not present

## 2017-12-03 DIAGNOSIS — M80051D Age-related osteoporosis with current pathological fracture, right femur, subsequent encounter for fracture with routine healing: Secondary | ICD-10-CM | POA: Diagnosis not present

## 2017-12-03 DIAGNOSIS — Z96641 Presence of right artificial hip joint: Secondary | ICD-10-CM | POA: Diagnosis not present

## 2017-12-03 DIAGNOSIS — N183 Chronic kidney disease, stage 3 (moderate): Secondary | ICD-10-CM | POA: Diagnosis not present

## 2017-12-03 DIAGNOSIS — M1991 Primary osteoarthritis, unspecified site: Secondary | ICD-10-CM | POA: Diagnosis not present

## 2017-12-03 DIAGNOSIS — F0390 Unspecified dementia without behavioral disturbance: Secondary | ICD-10-CM | POA: Diagnosis not present

## 2017-12-07 DIAGNOSIS — F0390 Unspecified dementia without behavioral disturbance: Secondary | ICD-10-CM | POA: Diagnosis not present

## 2017-12-07 DIAGNOSIS — I129 Hypertensive chronic kidney disease with stage 1 through stage 4 chronic kidney disease, or unspecified chronic kidney disease: Secondary | ICD-10-CM | POA: Diagnosis not present

## 2017-12-07 DIAGNOSIS — Z96641 Presence of right artificial hip joint: Secondary | ICD-10-CM | POA: Diagnosis not present

## 2017-12-07 DIAGNOSIS — M80051D Age-related osteoporosis with current pathological fracture, right femur, subsequent encounter for fracture with routine healing: Secondary | ICD-10-CM | POA: Diagnosis not present

## 2017-12-07 DIAGNOSIS — M1991 Primary osteoarthritis, unspecified site: Secondary | ICD-10-CM | POA: Diagnosis not present

## 2017-12-07 DIAGNOSIS — N183 Chronic kidney disease, stage 3 (moderate): Secondary | ICD-10-CM | POA: Diagnosis not present

## 2017-12-08 DIAGNOSIS — I129 Hypertensive chronic kidney disease with stage 1 through stage 4 chronic kidney disease, or unspecified chronic kidney disease: Secondary | ICD-10-CM | POA: Diagnosis not present

## 2017-12-08 DIAGNOSIS — M1991 Primary osteoarthritis, unspecified site: Secondary | ICD-10-CM | POA: Diagnosis not present

## 2017-12-08 DIAGNOSIS — N183 Chronic kidney disease, stage 3 (moderate): Secondary | ICD-10-CM | POA: Diagnosis not present

## 2017-12-08 DIAGNOSIS — Z96641 Presence of right artificial hip joint: Secondary | ICD-10-CM | POA: Diagnosis not present

## 2017-12-08 DIAGNOSIS — M80051D Age-related osteoporosis with current pathological fracture, right femur, subsequent encounter for fracture with routine healing: Secondary | ICD-10-CM | POA: Diagnosis not present

## 2017-12-08 DIAGNOSIS — F0390 Unspecified dementia without behavioral disturbance: Secondary | ICD-10-CM | POA: Diagnosis not present

## 2017-12-09 DIAGNOSIS — Z96641 Presence of right artificial hip joint: Secondary | ICD-10-CM | POA: Diagnosis not present

## 2017-12-09 DIAGNOSIS — F0390 Unspecified dementia without behavioral disturbance: Secondary | ICD-10-CM | POA: Diagnosis not present

## 2017-12-09 DIAGNOSIS — M80051D Age-related osteoporosis with current pathological fracture, right femur, subsequent encounter for fracture with routine healing: Secondary | ICD-10-CM | POA: Diagnosis not present

## 2017-12-09 DIAGNOSIS — I129 Hypertensive chronic kidney disease with stage 1 through stage 4 chronic kidney disease, or unspecified chronic kidney disease: Secondary | ICD-10-CM | POA: Diagnosis not present

## 2017-12-09 DIAGNOSIS — M1991 Primary osteoarthritis, unspecified site: Secondary | ICD-10-CM | POA: Diagnosis not present

## 2017-12-09 DIAGNOSIS — N183 Chronic kidney disease, stage 3 (moderate): Secondary | ICD-10-CM | POA: Diagnosis not present

## 2017-12-10 DIAGNOSIS — F0390 Unspecified dementia without behavioral disturbance: Secondary | ICD-10-CM | POA: Diagnosis not present

## 2017-12-10 DIAGNOSIS — I129 Hypertensive chronic kidney disease with stage 1 through stage 4 chronic kidney disease, or unspecified chronic kidney disease: Secondary | ICD-10-CM | POA: Diagnosis not present

## 2017-12-10 DIAGNOSIS — N183 Chronic kidney disease, stage 3 (moderate): Secondary | ICD-10-CM | POA: Diagnosis not present

## 2017-12-10 DIAGNOSIS — Z96641 Presence of right artificial hip joint: Secondary | ICD-10-CM | POA: Diagnosis not present

## 2017-12-10 DIAGNOSIS — M80051D Age-related osteoporosis with current pathological fracture, right femur, subsequent encounter for fracture with routine healing: Secondary | ICD-10-CM | POA: Diagnosis not present

## 2017-12-10 DIAGNOSIS — M1991 Primary osteoarthritis, unspecified site: Secondary | ICD-10-CM | POA: Diagnosis not present

## 2017-12-15 DIAGNOSIS — N183 Chronic kidney disease, stage 3 (moderate): Secondary | ICD-10-CM | POA: Diagnosis not present

## 2017-12-15 DIAGNOSIS — I129 Hypertensive chronic kidney disease with stage 1 through stage 4 chronic kidney disease, or unspecified chronic kidney disease: Secondary | ICD-10-CM | POA: Diagnosis not present

## 2017-12-15 DIAGNOSIS — Z96641 Presence of right artificial hip joint: Secondary | ICD-10-CM | POA: Diagnosis not present

## 2017-12-15 DIAGNOSIS — M80051D Age-related osteoporosis with current pathological fracture, right femur, subsequent encounter for fracture with routine healing: Secondary | ICD-10-CM | POA: Diagnosis not present

## 2017-12-15 DIAGNOSIS — F0390 Unspecified dementia without behavioral disturbance: Secondary | ICD-10-CM | POA: Diagnosis not present

## 2017-12-15 DIAGNOSIS — M1991 Primary osteoarthritis, unspecified site: Secondary | ICD-10-CM | POA: Diagnosis not present

## 2017-12-17 DIAGNOSIS — N183 Chronic kidney disease, stage 3 (moderate): Secondary | ICD-10-CM | POA: Diagnosis not present

## 2017-12-17 DIAGNOSIS — M1991 Primary osteoarthritis, unspecified site: Secondary | ICD-10-CM | POA: Diagnosis not present

## 2017-12-17 DIAGNOSIS — M80051D Age-related osteoporosis with current pathological fracture, right femur, subsequent encounter for fracture with routine healing: Secondary | ICD-10-CM | POA: Diagnosis not present

## 2017-12-17 DIAGNOSIS — F0390 Unspecified dementia without behavioral disturbance: Secondary | ICD-10-CM | POA: Diagnosis not present

## 2017-12-17 DIAGNOSIS — I129 Hypertensive chronic kidney disease with stage 1 through stage 4 chronic kidney disease, or unspecified chronic kidney disease: Secondary | ICD-10-CM | POA: Diagnosis not present

## 2017-12-17 DIAGNOSIS — Z96641 Presence of right artificial hip joint: Secondary | ICD-10-CM | POA: Diagnosis not present

## 2017-12-20 ENCOUNTER — Ambulatory Visit (INDEPENDENT_AMBULATORY_CARE_PROVIDER_SITE_OTHER): Payer: Medicare HMO | Admitting: Orthopaedic Surgery

## 2017-12-22 DIAGNOSIS — F0390 Unspecified dementia without behavioral disturbance: Secondary | ICD-10-CM | POA: Diagnosis not present

## 2017-12-22 DIAGNOSIS — N183 Chronic kidney disease, stage 3 (moderate): Secondary | ICD-10-CM | POA: Diagnosis not present

## 2017-12-22 DIAGNOSIS — M1991 Primary osteoarthritis, unspecified site: Secondary | ICD-10-CM | POA: Diagnosis not present

## 2017-12-22 DIAGNOSIS — M80051D Age-related osteoporosis with current pathological fracture, right femur, subsequent encounter for fracture with routine healing: Secondary | ICD-10-CM | POA: Diagnosis not present

## 2017-12-22 DIAGNOSIS — I129 Hypertensive chronic kidney disease with stage 1 through stage 4 chronic kidney disease, or unspecified chronic kidney disease: Secondary | ICD-10-CM | POA: Diagnosis not present

## 2017-12-22 DIAGNOSIS — Z96641 Presence of right artificial hip joint: Secondary | ICD-10-CM | POA: Diagnosis not present

## 2017-12-24 DIAGNOSIS — M1991 Primary osteoarthritis, unspecified site: Secondary | ICD-10-CM | POA: Diagnosis not present

## 2017-12-24 DIAGNOSIS — M80051D Age-related osteoporosis with current pathological fracture, right femur, subsequent encounter for fracture with routine healing: Secondary | ICD-10-CM | POA: Diagnosis not present

## 2017-12-24 DIAGNOSIS — Z96641 Presence of right artificial hip joint: Secondary | ICD-10-CM | POA: Diagnosis not present

## 2017-12-24 DIAGNOSIS — F0390 Unspecified dementia without behavioral disturbance: Secondary | ICD-10-CM | POA: Diagnosis not present

## 2017-12-24 DIAGNOSIS — N183 Chronic kidney disease, stage 3 (moderate): Secondary | ICD-10-CM | POA: Diagnosis not present

## 2017-12-24 DIAGNOSIS — I129 Hypertensive chronic kidney disease with stage 1 through stage 4 chronic kidney disease, or unspecified chronic kidney disease: Secondary | ICD-10-CM | POA: Diagnosis not present

## 2017-12-28 ENCOUNTER — Ambulatory Visit (INDEPENDENT_AMBULATORY_CARE_PROVIDER_SITE_OTHER): Payer: Medicare HMO

## 2017-12-28 ENCOUNTER — Encounter (INDEPENDENT_AMBULATORY_CARE_PROVIDER_SITE_OTHER): Payer: Self-pay | Admitting: Orthopaedic Surgery

## 2017-12-28 ENCOUNTER — Ambulatory Visit (INDEPENDENT_AMBULATORY_CARE_PROVIDER_SITE_OTHER): Payer: Medicare HMO | Admitting: Orthopaedic Surgery

## 2017-12-28 DIAGNOSIS — S72011D Unspecified intracapsular fracture of right femur, subsequent encounter for closed fracture with routine healing: Secondary | ICD-10-CM

## 2017-12-28 NOTE — Progress Notes (Signed)
Patient is approximately 2 months status post right partial hip replacement for femoral neck fracture.  She finished home physical therapy yesterday.  She is doing well she denies any pain.  Leg lengths are equal.  Painless rotation of the hip.  She is ambling without any assistive devices.  X-rays are unremarkable.  At this point we will see her back in 4 months for her 27-month visit.  If she is doing well no x-rays are needed and likely will release her at that time.

## 2018-04-11 ENCOUNTER — Other Ambulatory Visit: Payer: Self-pay | Admitting: Family Medicine

## 2018-05-02 ENCOUNTER — Ambulatory Visit (INDEPENDENT_AMBULATORY_CARE_PROVIDER_SITE_OTHER): Payer: Medicare HMO | Admitting: Orthopaedic Surgery

## 2018-05-03 ENCOUNTER — Other Ambulatory Visit: Payer: Self-pay | Admitting: Family Medicine

## 2018-05-17 ENCOUNTER — Other Ambulatory Visit: Payer: Self-pay | Admitting: Family Medicine

## 2018-07-09 ENCOUNTER — Other Ambulatory Visit: Payer: Self-pay | Admitting: Family Medicine

## 2018-07-28 ENCOUNTER — Other Ambulatory Visit: Payer: Self-pay | Admitting: Family Medicine

## 2018-07-29 MED ORDER — DONEPEZIL HCL 5 MG PO TABS: 5.0000 mg | ORAL_TABLET | Freq: Every day | ORAL | 0 refills | Status: DC

## 2018-07-29 NOTE — Telephone Encounter (Signed)
meds filled and [facility] will try to get in touch with pt to schedule appt

## 2018-07-29 NOTE — Telephone Encounter (Signed)
Please schedule f/u and refill both medicines until then

## 2018-07-29 NOTE — Telephone Encounter (Signed)
No recent or future appts., please advise  

## 2018-08-17 ENCOUNTER — Other Ambulatory Visit: Payer: Self-pay | Admitting: Family Medicine

## 2018-08-30 ENCOUNTER — Ambulatory Visit (INDEPENDENT_AMBULATORY_CARE_PROVIDER_SITE_OTHER): Payer: Medicare HMO | Admitting: Family Medicine

## 2018-08-30 ENCOUNTER — Encounter: Payer: Self-pay | Admitting: Family Medicine

## 2018-08-30 VITALS — BP 122/64 | HR 63 | Temp 98.3°F | Ht <= 58 in | Wt 112.8 lb

## 2018-08-30 DIAGNOSIS — Z7189 Other specified counseling: Secondary | ICD-10-CM | POA: Insufficient documentation

## 2018-08-30 DIAGNOSIS — M8000XS Age-related osteoporosis with current pathological fracture, unspecified site, sequela: Secondary | ICD-10-CM

## 2018-08-30 DIAGNOSIS — G3184 Mild cognitive impairment, so stated: Secondary | ICD-10-CM | POA: Diagnosis not present

## 2018-08-30 DIAGNOSIS — Z23 Encounter for immunization: Secondary | ICD-10-CM

## 2018-08-30 DIAGNOSIS — D696 Thrombocytopenia, unspecified: Secondary | ICD-10-CM

## 2018-08-30 DIAGNOSIS — I10 Essential (primary) hypertension: Secondary | ICD-10-CM

## 2018-08-30 DIAGNOSIS — E7800 Pure hypercholesterolemia, unspecified: Secondary | ICD-10-CM

## 2018-08-30 DIAGNOSIS — Z0001 Encounter for general adult medical examination with abnormal findings: Secondary | ICD-10-CM | POA: Diagnosis not present

## 2018-08-30 DIAGNOSIS — E559 Vitamin D deficiency, unspecified: Secondary | ICD-10-CM

## 2018-08-30 DIAGNOSIS — Z0000 Encounter for general adult medical examination without abnormal findings: Secondary | ICD-10-CM

## 2018-08-30 MED ORDER — SIMVASTATIN 20 MG PO TABS: 20.0000 mg | ORAL_TABLET | Freq: Every day | ORAL | 3 refills | Status: DC

## 2018-08-30 MED ORDER — METOPROLOL SUCCINATE ER 50 MG PO TB24: ORAL_TABLET | ORAL | 3 refills | Status: DC

## 2018-08-30 MED ORDER — DONEPEZIL HCL 10 MG PO TABS: 10.0000 mg | ORAL_TABLET | Freq: Every day | ORAL | 3 refills | Status: DC

## 2018-08-30 MED ORDER — LOSARTAN POTASSIUM 50 MG PO TABS: 50.0000 mg | ORAL_TABLET | Freq: Every day | ORAL | 3 refills | Status: DC

## 2018-08-30 MED ORDER — RALOXIFENE HCL 60 MG PO TABS: 60.0000 mg | ORAL_TABLET | Freq: Every day | ORAL | 3 refills | Status: DC

## 2018-08-30 NOTE — Assessment & Plan Note (Signed)
Tolerating aricept 5 mg well Will inc to 10 mg daily -alert if any side effects or problems  Fairly independent with family help and good support

## 2018-08-30 NOTE — Assessment & Plan Note (Signed)
Level today  Disc imp for bone and overall health In setting of OP

## 2018-08-30 NOTE — Assessment & Plan Note (Signed)
No symptoms/bleeding/bruising Cbc today

## 2018-08-30 NOTE — Progress Notes (Signed)
Subjective:    Patient ID: Amanda Salas, female    DOB: 1927/07/24, 84 y.o.   MRN: [id number]  HPI  I have personally reviewed the Medicare Annual Wellness questionnaire and have noted 1. The patient's medical and social history 2. Their use of alcohol, tobacco or illicit drugs 3. Their current medications and supplements 4. The patient's functional ability including ADL's, fall risks, home safety risks and hearing or visual             impairment. 5. Diet and physical activities 6. Evidence for depression or mood disorders  The patients weight, height, BMI have been recorded in the chart and visual acuity is per eye clinic.  I have made referrals, counseling and provided education to the patient based review of the above and I have provided the pt with a written personalized care plan for preventive services. Reviewed and updated provider list, see scanned forms.  See scanned forms.  Routine anticipatory guidance given to patient.  See health maintenance. Colon cancer screening-past age for  Breast cancer screening-pt declines mammograms  Self breast exam- no lumps or changes  Flu vaccine-today  Tetanus vaccine 12/10 Pneumovax-up to date  Zoster vaccine-interested in shingrix  dexa -declines  Known OP and compression fracture spine a year ago -on miacalcin  evista since 2015 (another year will be 5 y) R hip fx in past  Falls- one with hip fracture in January/ not currently using walker or cane unless she needs it  evista -4 years  Vit D (h/o vit D def) - lab today  Advance directive--has POA - daughter Will disc living will with family- thinks she wants to be DNR  Cognitive function addressed- see scanned forms- and if abnormal then additional documentation follows.  Has MCI-thinks her short term memory is worsening with time  Is up and down   Sleep is fair  She does nod off during the day  0 for depression screen   Mood - is good/ happy emotionally  Has a little  dog that makes her happy    Hearing Screening   125Hz  250Hz  500Hz  1000Hz  2000Hz  3000Hz  4000Hz  6000Hz  8000Hz   Right ear:   40 40 40  0    Left ear:   40 40 40  0      Visual Acuity Screening   Right eye Left eye Both eyes  Without correction:     With correction: 20/50 20/40 20/50   does not notice a hearing problem  Better than average for age  Wears glasses-goes to eye doctor every 2-3 years     PMH and SH reviewed  Meds, vitals, and allergies reviewed.   ROS: See HPI.  Otherwise negative.    Wt Readings from Last 3 Encounters:  08/30/18 112 lb 12 oz (51.1 kg)  10/30/17 125 lb (56.7 kg)  07/07/17 114 lb 12 oz (52.1 kg)  wt in nov was not accurate  Eating regularly -no problems with appetite  23.56 kg/m   bp is stable today  No cp or palpitations or headaches or edema  No side effects to medicines  BP Readings from Last 3 Encounters:  08/30/18 122/64  11/04/17 111/67  07/07/17 135/60     Hx of age related MCI  Taking aricept 5 mg daily (open to going up to 10 mg)  Family uses an app to check in with her and this is most helpful    Hyperlipidemia Lab Results  Component Value Date   CHOL 150  07/07/2017   HDL 63 07/07/2017   LDLCALC 66 07/07/2017   LDLDIRECT 81.0 07/01/2010   TRIG 104 07/07/2017   CHOLHDL 2.4 07/07/2017   zocor and diet  Due for labs    Patient Active Problem List   Diagnosis Date Noted  . Medicare annual wellness visit, subsequent 08/30/2018  . Routine general medical examination at a health care facility 08/30/2018  . Advance care planning 08/30/2018  . H/O fracture of hip 10/30/2017  . Hyponatremia 10/30/2017  . Leucocytosis 10/30/2017  . Dystrophic nail 04/23/2015  . Encounter for Medicare annual wellness exam 04/17/2014  . Colon cancer screening 04/17/2014  . Anemia 04/17/2014  . Compression fracture of thoracolumbar vertebra ([place]) 12/26/2013  . Low back pain 11/22/2013  . 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 MCI (mild cognitive  impairment) 12/29/2010  . Vitamin D deficiency 07/01/2010  . FALL, HX OF 07/01/2010  . Hyperlipidemia 08/10/2008  . THROMBOCYTOPENIA, CHRONIC 08/10/2008  . Essential hypertension 08/10/2008  . ALLERGIC RHINITIS 08/10/2008  . GERD 08/10/2008  . Osteoporosis 08/10/2008   Past Medical History:  Diagnosis Date  . Allergy   . Arthritis   . Cataract   . Disorder of bone and cartilage, unspecified   . GERD (gastroesophageal reflux disease)   . Hepatitis   . Hyperlipidemia   . Hypertension   . Thrombocytopenia, unspecified ([place])    Past Surgical History:  Procedure Laterality Date  . CARDIAC CATHETERIZATION    . ESOPHAGOGASTRODUODENOSCOPY    . TOTAL HIP ARTHROPLASTY Right 10/31/2017   Procedure: TOTAL HIP ARTHROPLASTY DIRECT ANTERIOR APPROACH;  Surgeon: Leandrew Koyanagi, MD;  Location: [facility];  Service: Orthopedics;  Laterality: Right;   Social History   Tobacco Use  . Smoking status: Never Smoker  . Smokeless tobacco: Never Used  Substance Use Topics  . Alcohol use: No    Alcohol/week: 0.0 standard drinks  . Drug use: No   Family History  Problem Relation Age of Onset  . Alzheimer's disease Mother    Allergies  Allergen Reactions  . Ace Inhibitors     REACTION: cough  . Alendronate Sodium     REACTION: pill got stuck   Current Outpatient Medications on File Prior to Visit  Medication Sig Dispense Refill  . acetaminophen (TYLENOL) 500 MG tablet Take 500 mg every 6 (six) hours as needed by mouth for mild pain or headache.    . Cholecalciferol (VITAMIN D3 PO) Take 1,000 Units daily by mouth.     . ferrous sulfate 325 (65 FE) MG tablet Take 1 tablet (325 mg total) 2 (two) times daily with a meal by mouth.    Marland Kitchen FLUZONE HIGH-DOSE 0.5 ML injection 1 each once.    Marland Kitchen HYDROcodone-acetaminophen (NORCO) 7.5-325 MG tablet Take 1-2 tablets every 6 (six) hours as needed by mouth for moderate pain. 30 tablet 0  . polyethylene glycol (MIRALAX / GLYCOLAX) packet Take 17 g daily as needed  by mouth. 14 each 0   No current facility-administered medications on file prior to visit.     Review of Systems  Constitutional: Positive for fatigue. Negative for activity change, appetite change, fever and unexpected weight change.  HENT: Negative for congestion, ear pain, rhinorrhea, sinus pressure and sore throat.   Eyes: Negative for pain, redness and visual disturbance.  Respiratory: Negative for cough, shortness of breath and wheezing.   Cardiovascular: Negative for chest pain and palpitations.  Gastrointestinal: Negative for abdominal pain, blood in stool, constipation and diarrhea.  Endocrine: Negative for polydipsia  and polyuria.  Genitourinary: Negative for dysuria, frequency and urgency.  Musculoskeletal: Negative for arthralgias, back pain and myalgias.  Skin: Negative for pallor and rash.  Allergic/Immunologic: Negative for environmental allergies.  Neurological: Negative for dizziness, syncope and headaches.  Hematological: Negative for adenopathy. Does not bruise/bleed easily.  Psychiatric/Behavioral: Negative for decreased concentration and dysphoric mood. The patient is not nervous/anxious.        Short term memory difficulties       Objective:   Physical Exam  Constitutional: She appears well-developed and well-nourished. No distress.  Petite elderly female well appearing  HENT:  Head: Normocephalic and atraumatic.  Right Ear: External ear normal.  Left Ear: External ear normal.  Mouth/Throat: Oropharynx is clear and moist.  Nares are boggy  Eyes: Pupils are equal, round, and reactive to light. Conjunctivae and EOM are normal. No scleral icterus.  Neck: Normal range of motion. Neck supple. No JVD present. Carotid bruit is not present. No thyromegaly present.  Cardiovascular: Normal rate, regular rhythm, normal heart sounds and intact distal pulses. Exam reveals no gallop.  Pulmonary/Chest: Effort normal and breath sounds normal. No respiratory distress. She has  no wheezes. She exhibits no tenderness. No breast tenderness, discharge or bleeding.  Good air exch  Abdominal: Soft. Bowel sounds are normal. She exhibits no distension, no abdominal bruit and no mass. There is no tenderness.  Genitourinary: No breast tenderness, discharge or bleeding.  Genitourinary Comments: Declined a breast exam   Musculoskeletal: Normal range of motion. She exhibits no edema or tenderness.  Mild kyphosis   Lymphadenopathy:    She has no cervical adenopathy.  Neurological: She is alert. She has normal reflexes. No cranial nerve deficit. She exhibits normal muscle tone. Coordination normal.  Skin: Skin is warm and dry. No rash noted. No erythema. No pallor.  Scattered SKs  Also lentigines  Psychiatric: She has a normal mood and affect. She exhibits abnormal recent memory.  Cheerful and talkative Answers questions appropriately          Assessment & Plan:   Problem List Items Addressed This Visit      Cardiovascular and Mediastinum   Essential hypertension    bp in fair control at this time  BP Readings from Last 1 Encounters:  08/30/18 122/64   No changes needed Most recent labs reviewed  Disc lifstyle change with low sodium diet and exercise        Relevant Medications   losartan (COZAAR) 50 MG tablet   metoprolol succinate (TOPROL-XL) 50 MG 24 hr tablet   simvastatin (ZOCOR) 20 MG tablet   Other Relevant Orders   CBC with Differential/Platelet   Comprehensive metabolic panel   Lipid panel   TSH     Nervous and Auditory   MCI (mild cognitive impairment)    Tolerating aricept 5 mg well Will inc to 10 mg daily -alert if any side effects or problems  Fairly independent with family help and good support         Musculoskeletal and Integument   Osteoporosis    Declines dexa Has had spinal comp fx and also hip fx Disc fall prevention- walker use Will stop miacalcin  Continue evista for one more year Continue ca and D ? If candidate for  prolia in the future       Relevant Medications   raloxifene (EVISTA) 60 MG tablet     Other   Advance care planning    Daughter is POA Does not have living will but  thinks she wants to be DNR Given paperwork/packet to work on /get notarized and return Will d/w her family       Hyperlipidemia    Disc goals for lipids and reasons to control them Rev last labs with pt Rev low sat fat diet in detail zocor and diet  Lipid panel today      Relevant Medications   losartan (COZAAR) 50 MG tablet   metoprolol succinate (TOPROL-XL) 50 MG 24 hr tablet   simvastatin (ZOCOR) 20 MG tablet   Other Relevant Orders   Lipid panel   Medicare annual wellness visit, subsequent - Primary    Reviewed health habits including diet and exercise and skin cancer prevention Reviewed appropriate screening tests for age  Also reviewed health mt list, fam hx and immunization status , as well as social and family history   See HPI Labs reviewed  Declines need for hearing aide Disc scheduling eye exam  Flu shot today  Declines dexa or mammogram  Disc getting on wait list for shingrix  Plans to work on living will- paperwork given  Safety disc with pt and family given MCI       Routine general medical examination at a health care facility    Reviewed health habits including diet and exercise and skin cancer prevention Reviewed appropriate screening tests for age  Also reviewed health mt list, fam hx and immunization status , as well as social and family history   See HPI Labs reviewed  Declines need for hearing aide Disc scheduling eye exam  Flu shot today  Declines dexa or mammogram  Disc getting on wait list for shingrix  Plans to work on living will- paperwork given  Safety disc with pt and family given MCI      THROMBOCYTOPENIA, CHRONIC    No symptoms/bleeding/bruising Cbc today      Relevant Orders   CBC with Differential/Platelet   Vitamin D deficiency    Level today  Disc imp  for bone and overall health In setting of OP      Relevant Orders   VITAMIN D 25 Hydroxy (Vit-D Deficiency, Fractures)    Other Visit Diagnoses    Need for influenza vaccination       Relevant Orders   Flu Vaccine QUAD 6+ mos PF IM (Fluarix Quad PF) (Completed)

## 2018-08-30 NOTE — Assessment & Plan Note (Signed)
Daughter is POA Does not have living will but thinks she wants to be DNR Given paperwork/packet to work on /get notarized and return Will d/w her family

## 2018-08-30 NOTE — Assessment & Plan Note (Signed)
bp in fair control at this time  BP Readings from Last 1 Encounters:  08/30/18 122/64   No changes needed Most recent labs reviewed  Disc lifstyle change with low sodium diet and exercise

## 2018-08-30 NOTE — Assessment & Plan Note (Signed)
Declines dexa Has had spinal comp fx and also hip fx Disc fall prevention- walker use Will stop miacalcin  Continue evista for one more year Continue ca and D ? If candidate for prolia in the future

## 2018-08-30 NOTE — Assessment & Plan Note (Signed)
Reviewed health habits including diet and exercise and skin cancer prevention Reviewed appropriate screening tests for age  Also reviewed health mt list, fam hx and immunization status , as well as social and family history   See HPI Labs reviewed  Declines need for hearing aide Disc scheduling eye exam  Flu shot today  Declines dexa or mammogram  Disc getting on wait list for shingrix  Plans to work on living will- paperwork given  Safety disc with pt and family given MCI

## 2018-08-30 NOTE — Patient Instructions (Addendum)
Take care of yourself   Flu shot today   If you are interested in the new shingles vaccine (Shingrix) - call your local pharmacy to check on coverage and availability  If if affordable - get on a wait list at the pharmacy   Stop the miacalcin spray - you are done with it  Continue the evista (you have another year)  Continue your calcium and vitamin D  To slow down memory loss- increase your generic aricept to 10 mg daily - I sent the new px to the pharmacy   Work on your living will /power of attorney and get [place] a copy   Do schedule a regular eye exam

## 2018-08-30 NOTE — Assessment & Plan Note (Signed)
Disc goals for lipids and reasons to control them Rev last labs with pt Rev low sat fat diet in detail zocor and diet  Lipid panel today

## 2018-08-31 LAB — LIPID PANEL
CHOLESTEROL: 143 mg/dL (ref 0–200)
HDL: 78.5 mg/dL (ref >39.00)
LDL Cholesterol: 44 mg/dL (ref 0–99)
NonHDL: 64.34
Total CHOL/HDL Ratio: 2
Triglycerides: 104 mg/dL (ref 0.0–149.0)
VLDL: 20.8 mg/dL (ref 0.0–40.0)

## 2018-08-31 LAB — CBC WITH DIFFERENTIAL/PLATELET
BASOS PCT: 1 % (ref 0.0–3.0)
Basophils Absolute: 0.1 10*3/uL (ref 0.0–0.1)
EOS ABS: 0.1 10*3/uL (ref 0.0–0.7)
EOS PCT: 2.1 % (ref 0.0–5.0)
HEMATOCRIT: 34.5 % — AB (ref 36.0–46.0)
HEMOGLOBIN: 11.3 g/dL — AB (ref 12.0–15.0)
LYMPHS PCT: 36.5 % (ref 12.0–46.0)
Lymphs Abs: 2.3 10*3/uL (ref 0.7–4.0)
MCHC: 32.8 g/dL (ref 30.0–36.0)
MCV: 82.9 fl (ref 78.0–100.0)
MONOS PCT: 9.6 % (ref 3.0–12.0)
Monocytes Absolute: 0.6 10*3/uL (ref 0.1–1.0)
Neutro Abs: 3.1 10*3/uL (ref 1.4–7.7)
Neutrophils Relative %: 50.8 % (ref 43.0–77.0)
Platelets: 158 10*3/uL (ref 150.0–400.0)
RBC: 4.16 Mil/uL (ref 3.87–5.11)
RDW: 14.9 % (ref 11.5–15.5)
WBC: 6.2 10*3/uL (ref 4.0–10.5)

## 2018-08-31 LAB — VITAMIN D 25 HYDROXY (VIT D DEFICIENCY, FRACTURES): VITD: 55.2 ng/mL (ref 30.00–100.00)

## 2018-08-31 LAB — COMPREHENSIVE METABOLIC PANEL
ALBUMIN: 4 g/dL (ref 3.5–5.2)
ALT: 8 U/L (ref 0–35)
AST: 15 U/L (ref 0–37)
Alkaline Phosphatase: 60 U/L (ref 39–117)
BILIRUBIN TOTAL: 0.3 mg/dL (ref 0.2–1.2)
BUN: 16 mg/dL (ref 6–23)
CALCIUM: 9.1 mg/dL (ref 8.4–10.5)
CO2: 27 mEq/L (ref 19–32)
CREATININE: 0.92 mg/dL (ref 0.40–1.20)
Chloride: 105 mEq/L (ref 96–112)
GFR: 61.13 mL/min (ref >60.00)
Glucose, Bld: 70 mg/dL (ref 70–99)
Potassium: 4.3 mEq/L (ref 3.5–5.1)
Sodium: 139 mEq/L (ref 135–145)
Total Protein: 7.5 g/dL (ref 6.0–8.3)

## 2018-08-31 LAB — TSH: TSH: 2.98 u[IU]/mL (ref 0.35–4.50)

## 2018-09-01 ENCOUNTER — Encounter: Payer: Self-pay | Admitting: *Deleted

## 2019-06-26 ENCOUNTER — Other Ambulatory Visit: Payer: Self-pay | Admitting: Family Medicine

## 2019-08-19 ENCOUNTER — Other Ambulatory Visit: Payer: Self-pay | Admitting: Family Medicine

## 2019-08-21 NOTE — Telephone Encounter (Signed)
No recent or future appts., please advise  

## 2019-08-21 NOTE — Telephone Encounter (Signed)
Please schedule PE late sept or later and refill until then

## 2019-08-22 ENCOUNTER — Other Ambulatory Visit: Payer: Self-pay | Admitting: Family Medicine

## 2019-08-22 NOTE — Telephone Encounter (Signed)
[facility] will reach out to pt to try and get CPE scheduled. Med refilled once

## 2019-09-19 ENCOUNTER — Ambulatory Visit (INDEPENDENT_AMBULATORY_CARE_PROVIDER_SITE_OTHER): Payer: Medicare HMO | Admitting: Family Medicine

## 2019-09-19 ENCOUNTER — Encounter: Payer: Self-pay | Admitting: Family Medicine

## 2019-09-19 DIAGNOSIS — E559 Vitamin D deficiency, unspecified: Secondary | ICD-10-CM | POA: Diagnosis not present

## 2019-09-19 DIAGNOSIS — E7800 Pure hypercholesterolemia, unspecified: Secondary | ICD-10-CM | POA: Diagnosis not present

## 2019-09-19 DIAGNOSIS — M8000XS Age-related osteoporosis with current pathological fracture, unspecified site, sequela: Secondary | ICD-10-CM | POA: Diagnosis not present

## 2019-09-19 DIAGNOSIS — I10 Essential (primary) hypertension: Secondary | ICD-10-CM

## 2019-09-19 DIAGNOSIS — G3184 Mild cognitive impairment, so stated: Secondary | ICD-10-CM | POA: Diagnosis not present

## 2019-09-19 DIAGNOSIS — D649 Anemia, unspecified: Secondary | ICD-10-CM | POA: Diagnosis not present

## 2019-09-19 MED ORDER — DONEPEZIL HCL 10 MG PO TABS: 10.0000 mg | ORAL_TABLET | Freq: Every day | ORAL | 3 refills | Status: DC

## 2019-09-19 MED ORDER — SIMVASTATIN 20 MG PO TABS: 20.0000 mg | ORAL_TABLET | Freq: Every day | ORAL | 3 refills | Status: DC

## 2019-09-19 MED ORDER — METOPROLOL SUCCINATE ER 50 MG PO TB24: ORAL_TABLET | ORAL | 3 refills | Status: DC

## 2019-09-19 MED ORDER — LOSARTAN POTASSIUM 50 MG PO TABS: 50.0000 mg | ORAL_TABLET | Freq: Every day | ORAL | 3 refills | Status: DC

## 2019-09-19 NOTE — Assessment & Plan Note (Signed)
Previously on iron -no longer takes it  Lab Results  Component Value Date   FERRITIN 71.9 06/01/2014   will check cbc when she can get in for labs  No fatigue or other symptoms

## 2019-09-19 NOTE — Assessment & Plan Note (Signed)
Disc goals for lipids and reasons to control them Rev last labs with pt Rev low sat fat diet in detail Pt continues to take simvastatin and watch diet  Will plan to get labs when able to safely come in

## 2019-09-19 NOTE — Assessment & Plan Note (Signed)
Last vit D level with current supplementation was in 50s Disc imp to bone and overall health  Will plan to re check level at next labs

## 2019-09-19 NOTE — Assessment & Plan Note (Signed)
Per pt no BP changes or symptoms  Plans to continue losartan and metoprolol xl  Enc good health habits Will f/u for visit in office when safely able

## 2019-09-19 NOTE — Progress Notes (Signed)
Virtual Visit via Telephone Note  I connected with Amanda Salas on 09/19/19 at  3:45 PM EDT by telephone and verified that I am speaking with the correct person using two identifiers.  Location: Patient: home Provider: office    I discussed the limitations, risks, security and privacy concerns of performing an evaluation and management service by telephone and the availability of in person appointments. I also discussed with the patient that there may be a patient responsible charge related to this service. The patient expressed understanding and agreed to proceed.   History of Present Illness: Pt presents for annual follow up of chronic medical problems   Says she is doing fine  Does not do a lot at her age  Gets tired in the afternoons  General aches and pains   Vital signs  Weight -has not weighed herself lately -does not think she lost or gained at all Same clothes  Appetite is great  Sleeps well overall   During pandemic -limits visitors  She does get lonely  Mood is very good despite this   Flu vaccine -plans to come for flu shot clinic   Bone density  H/o OP Declines dexa  H/o spinal compression fx and hip fx  No falls  No new fractures Formerly on miacalcin  Also evista - year 5  H/o low vit D-last time 43 with supplementation  Still taking her vitamin D   Td 11/10  PNA vaccines complete  Zoster status - not interested in shingrix    HTN- has not had any problems with it  BP Readings from Last 3 Encounters:  08/30/18 122/64  11/04/17 111/67  07/07/17 135/60   Does not check at home  Takes losartan 50 mg daily  Metoprolol xl 50 mg daily   H/o low sodium Lab Results  Component Value Date   CREATININE 0.92 08/30/2018   BUN 16 08/30/2018   NA 139 08/30/2018   K 4.3 08/30/2018   CL 105 08/30/2018   CO2 27 08/30/2018   Last time in nl range    Hyperlipidemia Lab Results  Component Value Date   CHOL 143 08/30/2018   HDL 78.50 08/30/2018    LDLCALC 44 08/30/2018   LDLDIRECT 81.0 07/01/2010   TRIG 104.0 08/30/2018   CHOLHDL 2 08/30/2018   Due for labs  Takes simvastatin   H/o anemia  Lab Results  Component Value Date   WBC 6.2 08/30/2018   HGB 11.3 (L) 08/30/2018   HCT 34.5 (L) 08/30/2018   MCV 82.9 08/30/2018   PLT 158.0 08/30/2018   No longer taking iron   Mild cognitive impairment  She admits to worse memory with time (good and bad days)  More apt to get confused at night - has dreams and wakes up and it seems real Taking aricept 10 mg daily  No safety concerns or household accidents   Takes occasional tylenol for joint pain       Patient Active Problem List   Diagnosis Date Noted  . Medicare annual wellness visit, subsequent 08/30/2018  . Routine general medical examination at a health care facility 08/30/2018  . Advance care planning 08/30/2018  . H/O fracture of hip 10/30/2017  . Hyponatremia 10/30/2017  . Dystrophic nail 04/23/2015  . Encounter for Medicare annual wellness exam 04/17/2014  . Colon cancer screening 04/17/2014  . Anemia 04/17/2014  . Compression fracture of thoracolumbar vertebra ([place]) 12/26/2013  . Low back pain 11/22/2013  . 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  MCI (mild cognitive impairment) 12/29/2010  . Vitamin D deficiency 07/01/2010  . FALL, HX OF 07/01/2010  . Hyperlipidemia 08/10/2008  . THROMBOCYTOPENIA, CHRONIC 08/10/2008  . Essential hypertension 08/10/2008  . ALLERGIC RHINITIS 08/10/2008  . GERD 08/10/2008  . Osteoporosis 08/10/2008   Past Medical History:  Diagnosis Date  . Allergy   . Arthritis   . Cataract   . Disorder of bone and cartilage, unspecified   . GERD (gastroesophageal reflux disease)   . Hepatitis   . Hyperlipidemia   . Hypertension   . Thrombocytopenia, unspecified ([place])    Past Surgical History:  Procedure Laterality Date  . CARDIAC CATHETERIZATION    . ESOPHAGOGASTRODUODENOSCOPY    . TOTAL HIP ARTHROPLASTY Right 10/31/2017   Procedure:  TOTAL HIP ARTHROPLASTY DIRECT ANTERIOR APPROACH;  Surgeon: Leandrew Koyanagi, MD;  Location: [facility];  Service: Orthopedics;  Laterality: Right;   Social History   Tobacco Use  . Smoking status: Never Smoker  . Smokeless tobacco: Never Used  Substance Use Topics  . Alcohol use: No    Alcohol/week: 0.0 standard drinks  . Drug use: No   Family History  Problem Relation Age of Onset  . Alzheimer's disease Mother    Allergies  Allergen Reactions  . Ace Inhibitors     REACTION: cough  . Alendronate Sodium     REACTION: pill got stuck   Current Outpatient Medications on File Prior to Visit  Medication Sig Dispense Refill  . acetaminophen (TYLENOL) 500 MG tablet Take 500 mg every 6 (six) hours as needed by mouth for mild pain or headache.    . Cholecalciferol (VITAMIN D3 PO) Take 1,000 Units daily by mouth.     . polyethylene glycol (MIRALAX / GLYCOLAX) packet Take 17 g daily as needed by mouth. 14 each 0   No current facility-administered medications on file prior to visit.    Review of Systems  Constitutional: Negative for chills, fever, malaise/fatigue and weight loss.  HENT: Negative for ear pain.        Some allergy runny nose   Eyes: Negative for blurred vision, discharge and redness.  Respiratory: Negative for cough and shortness of breath.   Cardiovascular: Negative for chest pain, palpitations and leg swelling.  Gastrointestinal: Negative for abdominal pain, diarrhea, nausea and vomiting.  Musculoskeletal: Positive for back pain and joint pain.  Skin: Negative for rash.  Neurological: Negative for dizziness and headaches.  Endo/Heme/Allergies: Does not bruise/bleed easily.  Psychiatric/Behavioral: Negative for depression. The patient is not nervous/anxious.     Observations/Objective: Patient sounds like her normal self  Voice is baseline slightly hoarse Daughter helps with history but cognition is overall good today  Answers all questions appropriately  Is able to hear  the call on speaker Nl affect-quite cheerful  Talkative   Assessment and Plan: Problem List Items Addressed This Visit      Cardiovascular and Mediastinum   Essential hypertension - Primary    Per pt no BP changes or symptoms  Plans to continue losartan and metoprolol xl  Enc good health habits Will f/u for visit in office when safely able      Relevant Medications   losartan (COZAAR) 50 MG tablet   metoprolol succinate (TOPROL-XL) 50 MG 24 hr tablet   simvastatin (ZOCOR) 20 MG tablet     Musculoskeletal and Integument   Osteoporosis    In pt with h/o vertebral compression fracture and hip fx in the past  Declines dexa Has taken miacalcin in the  past  Just completed 5 y of evista -will take drug holiday now No falls or fx  Counseled in fall prevention  Taking ca and D Will plan on checking D level with next labs        Other   Vitamin D deficiency    Last vit D level with current supplementation was in 50s Disc imp to bone and overall health  Will plan to re check level at next labs       Hyperlipidemia    Disc goals for lipids and reasons to control them Rev last labs with pt Rev low sat fat diet in detail Pt continues to take simvastatin and watch diet  Will plan to get labs when able to safely come in       Relevant Medications   losartan (COZAAR) 50 MG tablet   metoprolol succinate (TOPROL-XL) 50 MG 24 hr tablet   simvastatin (ZOCOR) 20 MG tablet   MCI (mild cognitive impairment)    Per pt and daughter short term memory loss continues to progress  occ mild confusion at night - living with family  No accidents or wandering  Daughter feels comfortable with her safety  Plans to continue aricept -no side effects or problems  Disc expectations       Anemia    Previously on iron -no longer takes it  Lab Results  Component Value Date   FERRITIN 71.9 06/01/2014   will check cbc when she can get in for labs  No fatigue or other symptoms          Follow Up Instructions:    I discussed the assessment and treatment plan with the patient. The patient was provided an opportunity to ask questions and all were answered. The patient agreed with the plan and demonstrated an understanding of the instructions.   The patient was advised to call back or seek an in-person evaluation if the symptoms worsen or if the condition fails to improve as anticipated.  I provided 25 minutes of non-face-to-face time during this encounter.   Loura Pardon, MD

## 2019-09-19 NOTE — Assessment & Plan Note (Signed)
In pt with h/o vertebral compression fracture and hip fx in the past  Declines dexa Has taken miacalcin in the past  Just completed 5 y of evista -will take drug holiday now No falls or fx  Counseled in fall prevention  Taking ca and D Will plan on checking D level with next labs

## 2019-09-19 NOTE — Patient Instructions (Addendum)
Come through the flu shot clinic to get your shot   [place] your current bottle with raloxifne and then stop it   Call and schedule labs (eat light) when you feel safe coming in

## 2019-09-19 NOTE — Assessment & Plan Note (Signed)
Per pt and daughter short term memory loss continues to progress  occ mild confusion at night - living with family  No accidents or wandering  Daughter feels comfortable with her safety  Plans to continue aricept -no side effects or problems  Disc expectations

## 2019-10-12 ENCOUNTER — Other Ambulatory Visit: Payer: Self-pay | Admitting: Family Medicine

## 2019-10-26 ENCOUNTER — Ambulatory Visit (INDEPENDENT_AMBULATORY_CARE_PROVIDER_SITE_OTHER): Payer: Medicare HMO

## 2019-10-26 DIAGNOSIS — Z23 Encounter for immunization: Secondary | ICD-10-CM

## 2020-03-24 NOTE — Progress Notes (Signed)
Amanda Salas T. [name], MD [facility] and [place] at [facility] [place], [address] Phone: [phone]  FAX: [phone]  Amanda Salas - 86 y.o. female  MRN [id number]  Date of Birth: April 03, 1927  Visit Date: 03/25/2020  PCP: Abner Greenspan, MD  Referred by: Tower, Wynelle Fanny, MD  Chief Complaint  Patient presents with  . Fall    3 x in the last 2 weeks during the night-States hip is giving out  . Numbness    legs    This visit occurred during the SARS-CoV-2 public health emergency.  Safety protocols were in place, including screening questions prior to the visit, additional usage of staff PPE, and extensive cleaning of exam room while observing appropriate contact time as indicated for disinfecting solutions.   Subjective:   Amanda Salas is a 86 y.o. very pleasant female patient with Body mass index is 23.77 kg/m. who presents with the following:  She is a 86 year old patient and she presents today with some hip pain and she has had a number of falls.  S/p R THA 10/2017, Amanda Salas  Additional history is given by the patient's daughter and granddaughter.  She lives with her granddaughter in a separate apartment.  There actively involved with her health care at all times.  They do not think she is acting quite right and she is limping quite a bit.  She normally does not even use her walker, but she is using that all the time now.  She has fallen twice after her initial injury.  She is also limping quite a bit.  She does have some mild dementia, but she does note that her pain is worse on the right side particularly laterally and posteriorly.  Legs are hurting and hurt when she is sitting down.  Feels like her legs are numb.  S/p fx in 2018.  For a while did not use her walker.   Two weeks ago she fell.  Felt a little bit faint.  Did not hit head, normally not lightheaded.   Normally pretty independent.  [place] two  more times.  R side hip to her thigh.  Review of Systems is noted in the HPI, as appropriate   Objective:   BP 132/64   Pulse (!) 49   Temp 99 F (37.2 C) (Temporal)   Wt 113 lb 12 oz (51.6 kg)   SpO2 97%   BMI 23.77 kg/m   GEN: No acute distress; alert,appropriate. PULM: Breathing comfortably in no respiratory distress PSYCH: Normally interactive.   She was examined seated.  She is mildly tender along the lower leg.  Nontender along the thigh, but she has some notable tenderness higher in the thigh on the right.  She also complains of pain posteriorly and when I palpate the lower pelvis.  Low back is nontender throughout.  I did not aggressively attempt motion in the right hip with concern of potential injury.  With the left hip she does have excellent motion for age.  There is no pain with movement of this hip.  Strength is relatively preserved.  Radiology: DG HIP UNILAT WITH PELVIS 2-3 VIEWS RIGHT  Result Date: 03/25/2020 CLINICAL DATA:  Recent fall with right hip pain, initial encounter EXAM: DG HIP (WITH OR WITHOUT PELVIS) 3V RIGHT COMPARISON:  11/15/2017 FINDINGS: Right hip prosthesis is noted. Some lucency is noted in the region of the greater trochanter new from the prior exam  suspicious for undisplaced fracture. Prior healed inferior pubic ramus fracture on the right is noted. Degenerative changes of the lumbar spine and left hip joint are seen. IMPRESSION: Changes suspicious for periprosthetic femoral fracture. CT may be helpful for further evaluation. These results will be called to the ordering clinician or representative by the Radiologist Assistant, and communication documented in the PACS or [employer]. Electronically Signed   By: Inez Catalina M.D.   On: 03/25/2020 15:17   DG FEMUR, MIN 2 VIEWS RIGHT  Result Date: 03/25/2020 CLINICAL DATA:  Recent fall with right leg pain, initial encounter EXAM: RIGHT FEMUR 2 VIEWS COMPARISON:  None. FINDINGS: Right hip prosthesis is  noted. Lucency is noted in the proximal femur as previously described on hip films. Degenerative changes about the knee joint are seen. Vascular calcifications are noted. IMPRESSION: Lucency in the proximal femur as described on hip films. No other acute abnormality is noted Electronically Signed   By: Inez Catalina M.D.   On: 03/25/2020 15:17     Assessment and Plan:     ICD-10-CM   1. Closed fracture of trochanter of right femur, initial encounter [facility])  S72.101A Ambulatory referral to Orthopedic Surgery  2. Acute right hip pain  M25.551 DG HIP UNILAT WITH PELVIS 2-3 VIEWS RIGHT    DG FEMUR, MIN 2 VIEWS RIGHT  3. Fall, initial encounter  W19.Merril Abbe DG HIP UNILAT WITH PELVIS 2-3 VIEWS RIGHT    DG FEMUR, MIN 2 VIEWS RIGHT    Ambulatory referral to Orthopedic Surgery  4. Acute back pain, unspecified back location, unspecified back pain laterality  M54.9 DG HIP UNILAT WITH PELVIS 2-3 VIEWS RIGHT    DG FEMUR, MIN 2 VIEWS RIGHT  5. Pain of right thigh  M79.651 DG HIP UNILAT WITH PELVIS 2-3 VIEWS RIGHT    DG FEMUR, MIN 2 VIEWS RIGHT    Ambulatory referral to Orthopedic Surgery  6. History of right hip replacement  Z96.641 DG HIP UNILAT WITH PELVIS 2-3 VIEWS RIGHT    DG FEMUR, MIN 2 VIEWS RIGHT    Ambulatory referral to Orthopedic Surgery   Total encounter time: 40 minutes. On the day of the patient encounter, this can include review of prior records, labs, and imaging.  Additional time can include counselling, consultation with peer MD in person or by telephone.  This also includes independent review of Radiology.  Additional time spent on record review, review of prior x-rays, review of prior records.  Long discussion with the patient and her family.  Agree with radiology that there is a periprosthetic fracture.  This does appear to be nondisplaced, and the patient has been walking on it for 3 weeks.  My primary concern here would be for additional falls which would worsen this fracture.  She is  going to continue to use her walker.  The family is going to be able to get some long-term help in long-term nursing aid help.  They report to me that they have been told from her insurance provider that [facility] should cover this.  I will relate this information to the patient's primary care doctor.  I made it consultation for her to see Dr. Erlinda Hong in follow-up, who I am sure would like to see and manage this patient who he operated on originally for her hip fracture and hip arthroplasty.  I appreciate his help.  Follow-up: No follow-ups on file.  No orders of the defined types were placed in this encounter.  Medications Discontinued During This Encounter  Medication Reason  . acetaminophen (TYLENOL) 500 MG tablet Change in therapy   Orders Placed This Encounter  Procedures  . DG HIP UNILAT WITH PELVIS 2-3 VIEWS RIGHT  . DG FEMUR, MIN 2 VIEWS RIGHT  . Ambulatory referral to Orthopedic Surgery    Signed,  Frederico Hamman T. Orval Dortch, MD   Outpatient Encounter Medications as of 03/25/2020  Medication Sig  . Acetaminophen (TYLENOL PO) Take 400 mg by mouth 2 (two) times daily as needed.  . Cholecalciferol (VITAMIN D3 PO) Take 1,000 Units daily by mouth.   . donepezil (ARICEPT) 10 MG tablet Take 1 tablet (10 mg total) by mouth at bedtime.  Marland Kitchen losartan (COZAAR) 50 MG tablet Take 1 tablet (50 mg total) by mouth daily.  . metoprolol succinate (TOPROL-XL) 50 MG 24 hr tablet Take with or immediately following a meal.  . polyethylene glycol (MIRALAX / GLYCOLAX) packet Take 17 g daily as needed by mouth.  . simvastatin (ZOCOR) 20 MG tablet Take 1 tablet (20 mg total) by mouth at bedtime.  . [DISCONTINUED] acetaminophen (TYLENOL) 500 MG tablet Take 500 mg every 6 (six) hours as needed by mouth for mild pain or headache.   No facility-administered encounter medications on file as of 03/25/2020.

## 2020-03-25 ENCOUNTER — Ambulatory Visit (INDEPENDENT_AMBULATORY_CARE_PROVIDER_SITE_OTHER): Payer: Medicare HMO | Admitting: Family Medicine

## 2020-03-25 ENCOUNTER — Other Ambulatory Visit: Payer: Self-pay

## 2020-03-25 ENCOUNTER — Ambulatory Visit (INDEPENDENT_AMBULATORY_CARE_PROVIDER_SITE_OTHER)
Admission: RE | Admit: 2020-03-25 | Discharge: 2020-03-25 | Disposition: A | Discharge status: Home / Self Care | Payer: Medicare HMO | Source: Ambulatory Visit | Attending: Family Medicine | Admitting: Family Medicine

## 2020-03-25 ENCOUNTER — Telehealth: Payer: Self-pay

## 2020-03-25 ENCOUNTER — Encounter: Payer: Self-pay | Admitting: Family Medicine

## 2020-03-25 VITALS — BP 132/64 | HR 49 | Temp 99.0°F | Wt 113.8 lb

## 2020-03-25 DIAGNOSIS — M79651 Pain in right thigh: Secondary | ICD-10-CM

## 2020-03-25 DIAGNOSIS — W19XXXA Unspecified fall, initial encounter: Secondary | ICD-10-CM

## 2020-03-25 DIAGNOSIS — S79921A Unspecified injury of right thigh, initial encounter: Secondary | ICD-10-CM | POA: Diagnosis not present

## 2020-03-25 DIAGNOSIS — M25551 Pain in right hip: Secondary | ICD-10-CM

## 2020-03-25 DIAGNOSIS — Z96641 Presence of right artificial hip joint: Secondary | ICD-10-CM | POA: Diagnosis not present

## 2020-03-25 DIAGNOSIS — M79604 Pain in right leg: Secondary | ICD-10-CM | POA: Diagnosis not present

## 2020-03-25 DIAGNOSIS — S79911A Unspecified injury of right hip, initial encounter: Secondary | ICD-10-CM | POA: Diagnosis not present

## 2020-03-25 DIAGNOSIS — M549 Dorsalgia, unspecified: Secondary | ICD-10-CM

## 2020-03-25 DIAGNOSIS — S72101A Unspecified trochanteric fracture of right femur, initial encounter for closed fracture: Secondary | ICD-10-CM

## 2020-03-25 NOTE — Patient Instructions (Signed)
REFERRALS TO SPECIALISTS, SPECIAL TESTS (MRI, CT, ULTRASOUNDS)  [facility] or  [facility] will help you.   During the  Covid-19 outbreak we are not having people meet with the patient care coordinators for their protection.  They will call you when your appointment is made.   In the most extreme case (like a stroke), I will try to get them to talk to you in the office, but some insurances require paperwork and authorizations.  This will always take some time.  Specialist appointment times vary a great deal, based on their schedule / openings. -- Some specialists have very long wait times. (Example. Dermatology)    

## 2020-03-25 NOTE — Telephone Encounter (Signed)
Received call with critical x-ray result from [place] at [facility]. Pt was seen by Dr. Lorelei Pont today for hip pain. Dr. Lorelei Pont already aware of the result.  FINDINGS: Right hip prosthesis is noted. Some lucency is noted in the region of the greater trochanter new from the prior exam suspicious for undisplaced fracture. Prior healed inferior pubic ramus fracture on the right is noted. Degenerative changes of the lumbar spine and left hip joint are seen.  IMPRESSION: Changes suspicious for periprosthetic femoral fracture. CT may be helpful for further evaluation.FINDINGS: Right hip prosthesis is noted. Some lucency is noted in the region of the greater trochanter new from the prior exam suspicious for undisplaced fracture. Prior healed inferior pubic ramus fracture on the right is noted. Degenerative changes of the lumbar spine and left hip joint are seen.  IMPRESSION: Changes suspicious for periprosthetic femoral fracture. CT may be helpful for further evaluation.

## 2020-03-26 ENCOUNTER — Ambulatory Visit (INDEPENDENT_AMBULATORY_CARE_PROVIDER_SITE_OTHER): Payer: Medicare HMO | Admitting: Orthopaedic Surgery

## 2020-03-26 ENCOUNTER — Ambulatory Visit: Payer: Medicare HMO | Admitting: Orthopaedic Surgery

## 2020-03-26 ENCOUNTER — Ambulatory Visit: Payer: Self-pay

## 2020-03-26 DIAGNOSIS — M25551 Pain in right hip: Secondary | ICD-10-CM

## 2020-03-26 NOTE — Progress Notes (Signed)
Office Visit Note   Patient: Amanda Salas           Date of Birth: [birth year]           MRN: [id number] Visit Date: 03/26/2020              Requested by: Tower, Wynelle Fanny, MD [place],  [place] [address] PCP: Abner Greenspan, MD   Assessment & Plan: Visit Diagnoses:  1. Pain in right hip     Plan: Impression is right sided nondisplaced periprosthetic greater trochanter fracture without any compromise to the femoral stem.  This should be amenable to nonoperative treatment.  She will avoid any active hip abduction for the next 6 weeks.  She will rest as much as possible and use ice and heat as needed.  Follow-up with [place] in 4 to 6 weeks time for repeat evaluation AP pelvis lateral right hip x-rays.  Follow-Up Instructions: Return in about 5 weeks (around 04/30/2020).   Orders:  Orders Placed This Encounter  Procedures  . XR Lumbar Spine 2-3 Views   No orders of the defined types were placed in this encounter.     Procedures: No procedures performed   Clinical Data: No additional findings.   Subjective: Chief Complaint  Patient presents with  . Right Hip - Fracture    HPI patient is a pleasant 86 year old female who comes in today for further evaluation of her right hip.  She is about 2-1/2 years out right partial hip replacement for femoral neck fracture date of surgery 10/31/2017.  She has been doing well up until recently.  She has had 3 falls within the last 2 weeks.  The first fell was on her right side and and onto a hard surface.  The last 2 falls she describes as her right leg giving way and are slowly coming down to the 4 on carpet.  She was seen by her PCP yesterday where x-rays were obtained.  X-rays demonstrated a possible periprosthetic femur fracture.  She comes in today with her daughter for further evaluation treatment recommendation.  Prior to the initial fall, she was ambulating without assistance.  She is now ambulating with a walker due  to increased pain with ambulation.  Majority of her pain is to the anterolateral hip and radiates down the anterior thigh.  She has been taking Tylenol without significant relief of symptoms.  She does note numbness to the lateral thigh which is new since the fall.  Review of Systems as detailed in HPI.  All others reviewed and are negative.   Objective: Vital Signs: There were no vitals taken for this visit.  Physical Exam well-developed well-nourished female no acute distress.  Alert and oriented x3.  Ortho Exam examination of her right hip reveals a positive logroll.  Positive straight leg raise.  She does have moderate tenderness to the greater trochanter.  Mild tenderness to the lower lumbar spine.  No focal weakness.  She is neurovascularly intact distally.  Specialty Comments:  No specialty comments available.  Imaging: XR Lumbar Spine 2-3 Views  Result Date: 03/26/2020 X-rays demonstrate moderate diffuse spondylosis.  No acute fracture noted.  X-rays of the right hip reviewed by me in canopy show a nondisplaced periprosthetic greater trochanter fracture  PMFS History: Patient Active Problem List   Diagnosis Date Noted  . Medicare annual wellness visit, subsequent 08/30/2018  . Routine general medical examination at a health care facility 08/30/2018  . Advance care  planning 08/30/2018  . H/O fracture of hip 10/30/2017  . Dystrophic nail 04/23/2015  . Encounter for Medicare annual wellness exam 04/17/2014  . Colon cancer screening 04/17/2014  . Anemia 04/17/2014  . History of vertebral compression fracture 12/26/2013  . Low back pain 11/22/2013  . 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 MCI (mild cognitive impairment) 12/29/2010  . Vitamin D deficiency 07/01/2010  . FALL, HX OF 07/01/2010  . Hyperlipidemia 08/10/2008  . Essential hypertension 08/10/2008  . ALLERGIC RHINITIS 08/10/2008  . GERD 08/10/2008  . Osteoporosis 08/10/2008   Past Medical History:  Diagnosis Date  .  Allergy   . Arthritis   . Cataract   . Disorder of bone and cartilage, unspecified   . GERD (gastroesophageal reflux disease)   . Hepatitis   . Hyperlipidemia   . Hypertension   . Thrombocytopenia, unspecified ([place])     Family History  Problem Relation Age of Onset  . Alzheimer's disease Mother     Past Surgical History:  Procedure Laterality Date  . CARDIAC CATHETERIZATION    . ESOPHAGOGASTRODUODENOSCOPY    . TOTAL HIP ARTHROPLASTY Right 10/31/2017   Procedure: TOTAL HIP ARTHROPLASTY DIRECT ANTERIOR APPROACH;  Surgeon: Leandrew Koyanagi, MD;  Location: [place];  Service: Orthopedics;  Laterality: Right;   Social History   Occupational History  . Not on file  Tobacco Use  . Smoking status: Never Smoker  . Smokeless tobacco: Never Used  Substance and Sexual Activity  . Alcohol use: No    Alcohol/week: 0.0 standard drinks  . Drug use: No  . Sexual activity: Never

## 2020-04-02 ENCOUNTER — Ambulatory Visit: Payer: Medicare HMO | Admitting: Orthopaedic Surgery

## 2020-05-01 ENCOUNTER — Other Ambulatory Visit: Payer: Self-pay

## 2020-05-01 ENCOUNTER — Ambulatory Visit (INDEPENDENT_AMBULATORY_CARE_PROVIDER_SITE_OTHER): Payer: Medicare HMO

## 2020-05-01 ENCOUNTER — Encounter: Payer: Self-pay | Admitting: Orthopaedic Surgery

## 2020-05-01 ENCOUNTER — Ambulatory Visit (INDEPENDENT_AMBULATORY_CARE_PROVIDER_SITE_OTHER): Payer: Medicare HMO | Admitting: Orthopaedic Surgery

## 2020-05-01 DIAGNOSIS — M25551 Pain in right hip: Secondary | ICD-10-CM | POA: Diagnosis not present

## 2020-05-01 NOTE — Progress Notes (Signed)
Office Visit Note   Patient: Amanda Salas           Date of Birth: [birth year]           MRN: [id number] Visit Date: 05/01/2020              Requested by: Tower, Wynelle Fanny, MD [place],  [place] [address] PCP: Abner Greenspan, MD   Assessment & Plan: Visit Diagnoses:  1. Pain in right hip     Plan: Impression is healed right greater trochanter periprosthetic fracture.  At this point she can increase activity as tolerated.  She may use the walker based on her discretion.  Follow-up as needed.  Follow-Up Instructions: Return if symptoms worsen or fail to improve.   Orders:  Orders Placed This Encounter  Procedures  . XR HIP UNILAT W OR W/O PELVIS 2-3 VIEWS RIGHT   No orders of the defined types were placed in this encounter.     Procedures: No procedures performed   Clinical Data: No additional findings.   Subjective: Chief Complaint  Patient presents with  . Right Hip - Follow-up    Patient returns today for follow-up of her nondisplaced right periprosthetic greater trochanter fracture.  She overall has been doing well.  She has pain.  Denies any severe right hip.  Ambulating with a walker although she will often go without it in the house.   Review of Systems   Objective: Vital Signs: There were no vitals taken for this visit.  Physical Exam  Ortho Exam Right hip shows no significant tenderness of the greater trochanter.  Good movement of the hip joint without pain. Specialty Comments:  No specialty comments available.  Imaging: XR HIP UNILAT W OR W/O PELVIS 2-3 VIEWS RIGHT  Result Date: 05/01/2020 Healed right greater trochanter periprosthetic fracture.  Stable femoral component    PMFS History: Patient Active Problem List   Diagnosis Date Noted  . Medicare annual wellness visit, subsequent 08/30/2018  . Routine general medical examination at a health care facility 08/30/2018  . Advance care planning 08/30/2018  . H/O  fracture of hip 10/30/2017  . Dystrophic nail 04/23/2015  . Encounter for Medicare annual wellness exam 04/17/2014  . Colon cancer screening 04/17/2014  . Anemia 04/17/2014  . History of vertebral compression fracture 12/26/2013  . Low back pain 11/22/2013  . 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 MCI (mild cognitive impairment) 12/29/2010  . Vitamin D deficiency 07/01/2010  . FALL, HX OF 07/01/2010  . Hyperlipidemia 08/10/2008  . Essential hypertension 08/10/2008  . ALLERGIC RHINITIS 08/10/2008  . GERD 08/10/2008  . Osteoporosis 08/10/2008   Past Medical History:  Diagnosis Date  . Allergy   . Arthritis   . Cataract   . Disorder of bone and cartilage, unspecified   . GERD (gastroesophageal reflux disease)   . Hepatitis   . Hyperlipidemia   . Hypertension   . Thrombocytopenia, unspecified ([place])     Family History  Problem Relation Age of Onset  . Alzheimer's disease Mother     Past Surgical History:  Procedure Laterality Date  . CARDIAC CATHETERIZATION    . ESOPHAGOGASTRODUODENOSCOPY    . TOTAL HIP ARTHROPLASTY Right 10/31/2017   Procedure: TOTAL HIP ARTHROPLASTY DIRECT ANTERIOR APPROACH;  Surgeon: Leandrew Koyanagi, MD;  Location: [facility] [facility];  Service: Orthopedics;  Laterality: Right;   Social History   Occupational History  . Not on file  Tobacco Use  . Smoking status: Never Smoker  .  Smokeless tobacco: Never Used  Substance and Sexual Activity  . Alcohol use: No    Alcohol/week: 0.0 standard drinks  . Drug use: No  . Sexual activity: Never

## 2020-06-09 ENCOUNTER — Encounter (HOSPITAL_COMMUNITY)
Admission: EM | Disposition: A | Discharge status: Skilled Nursing Facility | Payer: Self-pay | Source: Home / Self Care | Attending: Internal Medicine

## 2020-06-09 ENCOUNTER — Other Ambulatory Visit: Payer: Self-pay

## 2020-06-09 ENCOUNTER — Inpatient Hospital Stay (HOSPITAL_COMMUNITY): Payer: Medicare HMO | Admitting: Anesthesiology

## 2020-06-09 ENCOUNTER — Encounter (HOSPITAL_COMMUNITY): Payer: Self-pay | Admitting: Internal Medicine

## 2020-06-09 ENCOUNTER — Inpatient Hospital Stay (HOSPITAL_COMMUNITY)
Admission: EM | Admit: 2020-06-09 | Discharge: 2020-06-12 | DRG: 481 | Disposition: A | Discharge status: Skilled Nursing Facility | Payer: Medicare HMO | Attending: Internal Medicine | Admitting: Internal Medicine

## 2020-06-09 ENCOUNTER — Inpatient Hospital Stay (HOSPITAL_COMMUNITY): Payer: Medicare HMO

## 2020-06-09 ENCOUNTER — Emergency Department (HOSPITAL_COMMUNITY): Payer: Medicare HMO

## 2020-06-09 DIAGNOSIS — W1830XA Fall on same level, unspecified, initial encounter: Secondary | ICD-10-CM | POA: Diagnosis present

## 2020-06-09 DIAGNOSIS — F0280 Dementia in other diseases classified elsewhere without behavioral disturbance: Secondary | ICD-10-CM

## 2020-06-09 DIAGNOSIS — M2550 Pain in unspecified joint: Secondary | ICD-10-CM | POA: Diagnosis not present

## 2020-06-09 DIAGNOSIS — R262 Difficulty in walking, not elsewhere classified: Secondary | ICD-10-CM | POA: Diagnosis not present

## 2020-06-09 DIAGNOSIS — K219 Gastro-esophageal reflux disease without esophagitis: Secondary | ICD-10-CM | POA: Diagnosis present

## 2020-06-09 DIAGNOSIS — N1831 Chronic kidney disease, stage 3a: Secondary | ICD-10-CM | POA: Diagnosis not present

## 2020-06-09 DIAGNOSIS — E785 Hyperlipidemia, unspecified: Secondary | ICD-10-CM | POA: Diagnosis not present

## 2020-06-09 DIAGNOSIS — Z03818 Encounter for observation for suspected exposure to other biological agents ruled out: Secondary | ICD-10-CM | POA: Diagnosis not present

## 2020-06-09 DIAGNOSIS — N179 Acute kidney failure, unspecified: Secondary | ICD-10-CM | POA: Diagnosis not present

## 2020-06-09 DIAGNOSIS — M62838 Other muscle spasm: Secondary | ICD-10-CM | POA: Diagnosis not present

## 2020-06-09 DIAGNOSIS — D62 Acute posthemorrhagic anemia: Secondary | ICD-10-CM | POA: Diagnosis not present

## 2020-06-09 DIAGNOSIS — S72002A Fracture of unspecified part of neck of left femur, initial encounter for closed fracture: Secondary | ICD-10-CM | POA: Diagnosis not present

## 2020-06-09 DIAGNOSIS — R0902 Hypoxemia: Secondary | ICD-10-CM | POA: Diagnosis not present

## 2020-06-09 DIAGNOSIS — I10 Essential (primary) hypertension: Secondary | ICD-10-CM | POA: Diagnosis present

## 2020-06-09 DIAGNOSIS — R739 Hyperglycemia, unspecified: Secondary | ICD-10-CM | POA: Diagnosis not present

## 2020-06-09 DIAGNOSIS — M545 Low back pain: Secondary | ICD-10-CM | POA: Diagnosis not present

## 2020-06-09 DIAGNOSIS — M80052A Age-related osteoporosis with current pathological fracture, left femur, initial encounter for fracture: Principal | ICD-10-CM | POA: Diagnosis present

## 2020-06-09 DIAGNOSIS — Z8781 Personal history of (healed) traumatic fracture: Secondary | ICD-10-CM | POA: Diagnosis present

## 2020-06-09 DIAGNOSIS — Z820 Family history of epilepsy and other diseases of the nervous system: Secondary | ICD-10-CM | POA: Diagnosis not present

## 2020-06-09 DIAGNOSIS — Z96641 Presence of right artificial hip joint: Secondary | ICD-10-CM | POA: Diagnosis present

## 2020-06-09 DIAGNOSIS — D631 Anemia in chronic kidney disease: Secondary | ICD-10-CM | POA: Diagnosis not present

## 2020-06-09 DIAGNOSIS — I447 Left bundle-branch block, unspecified: Secondary | ICD-10-CM | POA: Diagnosis not present

## 2020-06-09 DIAGNOSIS — Z888 Allergy status to other drugs, medicaments and biological substances status: Secondary | ICD-10-CM | POA: Diagnosis not present

## 2020-06-09 DIAGNOSIS — R5381 Other malaise: Secondary | ICD-10-CM | POA: Diagnosis not present

## 2020-06-09 DIAGNOSIS — S0990XA Unspecified injury of head, initial encounter: Secondary | ICD-10-CM | POA: Diagnosis not present

## 2020-06-09 DIAGNOSIS — E7800 Pure hypercholesterolemia, unspecified: Secondary | ICD-10-CM | POA: Diagnosis not present

## 2020-06-09 DIAGNOSIS — G301 Alzheimer's disease with late onset: Secondary | ICD-10-CM | POA: Diagnosis not present

## 2020-06-09 DIAGNOSIS — Z66 Do not resuscitate: Secondary | ICD-10-CM | POA: Diagnosis not present

## 2020-06-09 DIAGNOSIS — I129 Hypertensive chronic kidney disease with stage 1 through stage 4 chronic kidney disease, or unspecified chronic kidney disease: Secondary | ICD-10-CM | POA: Diagnosis present

## 2020-06-09 DIAGNOSIS — Z4789 Encounter for other orthopedic aftercare: Secondary | ICD-10-CM | POA: Diagnosis not present

## 2020-06-09 DIAGNOSIS — R52 Pain, unspecified: Secondary | ICD-10-CM | POA: Diagnosis not present

## 2020-06-09 DIAGNOSIS — F0390 Unspecified dementia without behavioral disturbance: Secondary | ICD-10-CM | POA: Diagnosis present

## 2020-06-09 DIAGNOSIS — Z20822 Contact with and (suspected) exposure to covid-19: Secondary | ICD-10-CM | POA: Diagnosis present

## 2020-06-09 DIAGNOSIS — S72009A Fracture of unspecified part of neck of unspecified femur, initial encounter for closed fracture: Secondary | ICD-10-CM | POA: Diagnosis not present

## 2020-06-09 DIAGNOSIS — S72142D Displaced intertrochanteric fracture of left femur, subsequent encounter for closed fracture with routine healing: Secondary | ICD-10-CM | POA: Diagnosis not present

## 2020-06-09 DIAGNOSIS — S72142A Displaced intertrochanteric fracture of left femur, initial encounter for closed fracture: Secondary | ICD-10-CM | POA: Diagnosis not present

## 2020-06-09 DIAGNOSIS — Y92019 Unspecified place in single-family (private) house as the place of occurrence of the external cause: Secondary | ICD-10-CM

## 2020-06-09 DIAGNOSIS — R488 Other symbolic dysfunctions: Secondary | ICD-10-CM | POA: Diagnosis not present

## 2020-06-09 DIAGNOSIS — M6281 Muscle weakness (generalized): Secondary | ICD-10-CM | POA: Diagnosis not present

## 2020-06-09 DIAGNOSIS — R7309 Other abnormal glucose: Secondary | ICD-10-CM | POA: Diagnosis present

## 2020-06-09 DIAGNOSIS — Z419 Encounter for procedure for purposes other than remedying health state, unspecified: Secondary | ICD-10-CM

## 2020-06-09 DIAGNOSIS — Z7401 Bed confinement status: Secondary | ICD-10-CM | POA: Diagnosis not present

## 2020-06-09 DIAGNOSIS — F0150 Vascular dementia without behavioral disturbance: Secondary | ICD-10-CM | POA: Diagnosis not present

## 2020-06-09 DIAGNOSIS — I4891 Unspecified atrial fibrillation: Secondary | ICD-10-CM | POA: Diagnosis not present

## 2020-06-09 DIAGNOSIS — S7222XA Displaced subtrochanteric fracture of left femur, initial encounter for closed fracture: Secondary | ICD-10-CM | POA: Diagnosis not present

## 2020-06-09 DIAGNOSIS — R2681 Unsteadiness on feet: Secondary | ICD-10-CM | POA: Diagnosis not present

## 2020-06-09 HISTORY — DX: Unspecified dementia, unspecified severity, without behavioral disturbance, psychotic disturbance, mood disturbance, and anxiety: F03.90

## 2020-06-09 HISTORY — PX: INTRAMEDULLARY (IM) NAIL INTERTROCHANTERIC: SHX5875

## 2020-06-09 LAB — CBC WITH DIFFERENTIAL/PLATELET
Abs Immature Granulocytes: 0.07 10*3/uL (ref 0.00–0.07)
Basophils Absolute: 0 10*3/uL (ref 0.0–0.1)
Basophils Relative: 0 %
Eosinophils Absolute: 0.1 10*3/uL (ref 0.0–0.5)
Eosinophils Relative: 1 %
HCT: 34.9 % — ABNORMAL LOW (ref 36.0–46.0)
Hemoglobin: 11 g/dL — ABNORMAL LOW (ref 12.0–15.0)
Immature Granulocytes: 1 %
Lymphocytes Relative: 11 %
Lymphs Abs: 1.1 10*3/uL (ref 0.7–4.0)
MCH: 28.1 pg (ref 26.0–34.0)
MCHC: 31.5 g/dL (ref 30.0–36.0)
MCV: 89 fL (ref 80.0–100.0)
Monocytes Absolute: 0.6 10*3/uL (ref 0.1–1.0)
Monocytes Relative: 6 %
Neutro Abs: 8.5 10*3/uL — ABNORMAL HIGH (ref 1.7–7.7)
Neutrophils Relative %: 81 %
Platelets: 89 10*3/uL — ABNORMAL LOW (ref 150–400)
RBC: 3.92 MIL/uL (ref 3.87–5.11)
RDW: 14.7 % (ref 11.5–15.5)
WBC: 10.4 10*3/uL (ref 4.0–10.5)
nRBC: 0 % (ref 0.0–0.2)

## 2020-06-09 LAB — SURGICAL PCR SCREEN
MRSA, PCR: NEGATIVE
Staphylococcus aureus: NEGATIVE

## 2020-06-09 LAB — BASIC METABOLIC PANEL
Anion gap: 12 (ref 5–15)
BUN: 15 mg/dL (ref 8–23)
CO2: 22 mmol/L (ref 22–32)
Calcium: 9 mg/dL (ref 8.9–10.3)
Chloride: 103 mmol/L (ref 98–111)
Creatinine, Ser: 0.98 mg/dL (ref 0.44–1.00)
GFR calc Af Amer: 59 mL/min — ABNORMAL LOW (ref >60)
GFR calc non Af Amer: 51 mL/min — ABNORMAL LOW (ref >60)
Glucose, Bld: 137 mg/dL — ABNORMAL HIGH (ref 70–99)
Potassium: 3.4 mmol/L — ABNORMAL LOW (ref 3.5–5.1)
Sodium: 137 mmol/L (ref 135–145)

## 2020-06-09 LAB — PROTIME-INR
INR: 1 (ref 0.8–1.2)
Prothrombin Time: 13.1 seconds (ref 11.4–15.2)

## 2020-06-09 LAB — SARS CORONAVIRUS 2 BY RT PCR (HOSPITAL ORDER, PERFORMED IN ~~LOC~~ HOSPITAL LAB): SARS Coronavirus 2: NEGATIVE

## 2020-06-09 SURGERY — FIXATION, FRACTURE, INTERTROCHANTERIC, WITH INTRAMEDULLARY ROD
Anesthesia: General | Laterality: Left

## 2020-06-09 MED ORDER — METHOCARBAMOL 1000 MG/10ML IJ SOLN
500.0000 mg | Freq: Four times a day (QID) | INTRAVENOUS | Status: DC | PRN
  Filled 2020-06-09: qty 5

## 2020-06-09 MED ORDER — CEFAZOLIN SODIUM-DEXTROSE 2-4 GM/100ML-% IV SOLN
2.0000 g | Freq: Four times a day (QID) | INTRAVENOUS | Status: AC
  Administered 2020-06-09 – 2020-06-10 (×3): 2 g via INTRAVENOUS
  Filled 2020-06-09 (×3): qty 100

## 2020-06-09 MED ORDER — CEFAZOLIN SODIUM-DEXTROSE 2-4 GM/100ML-% IV SOLN
2.0000 g | INTRAVENOUS | Status: AC
  Administered 2020-06-09: 2 g via INTRAVENOUS

## 2020-06-09 MED ORDER — LIDOCAINE 2% (20 MG/ML) 5 ML SYRINGE
INTRAMUSCULAR | Status: AC
  Filled 2020-06-09: qty 5

## 2020-06-09 MED ORDER — SORBITOL 70 % SOLN: 30.0000 mL | Freq: Every day | Status: DC | PRN

## 2020-06-09 MED ORDER — ONDANSETRON HCL 4 MG/2ML IJ SOLN
4.0000 mg | Freq: Once | INTRAMUSCULAR | Status: AC
  Administered 2020-06-09: 4 mg via INTRAVENOUS
  Filled 2020-06-09: qty 2

## 2020-06-09 MED ORDER — MORPHINE SULFATE (PF) 2 MG/ML IV SOLN: 2.0000 mg | INTRAVENOUS | Status: DC | PRN

## 2020-06-09 MED ORDER — ENOXAPARIN SODIUM 40 MG/0.4ML ~~LOC~~ SOLN: 40.0000 mg | SUBCUTANEOUS | Status: DC

## 2020-06-09 MED ORDER — ROCURONIUM BROMIDE 10 MG/ML (PF) SYRINGE
PREFILLED_SYRINGE | INTRAVENOUS | Status: DC | PRN
  Administered 2020-06-09: 40 mg via INTRAVENOUS
  Administered 2020-06-09: 10 mg via INTRAVENOUS

## 2020-06-09 MED ORDER — SIMVASTATIN 20 MG PO TABS
20.0000 mg | ORAL_TABLET | Freq: Every day | ORAL | Status: DC
  Administered 2020-06-10 – 2020-06-12 (×3): 20 mg via ORAL
  Filled 2020-06-09 (×3): qty 1

## 2020-06-09 MED ORDER — FENTANYL CITRATE (PF) 100 MCG/2ML IJ SOLN
25.0000 ug | INTRAMUSCULAR | Status: DC | PRN
  Administered 2020-06-09: 25 ug via INTRAVENOUS

## 2020-06-09 MED ORDER — TRANEXAMIC ACID 1000 MG/10ML IV SOLN
2000.0000 mg | INTRAVENOUS | Status: AC
  Administered 2020-06-09: 2000 mg via TOPICAL
  Filled 2020-06-09: qty 20

## 2020-06-09 MED ORDER — ONDANSETRON HCL 4 MG PO TABS: 4.0000 mg | ORAL_TABLET | Freq: Four times a day (QID) | ORAL | Status: DC | PRN

## 2020-06-09 MED ORDER — OXYCODONE HCL 5 MG/5ML PO SOLN: 5.0000 mg | Freq: Once | ORAL | Status: AC | PRN

## 2020-06-09 MED ORDER — ALBUMIN HUMAN 5 % IV SOLN: INTRAVENOUS | Status: DC | PRN

## 2020-06-09 MED ORDER — TRANEXAMIC ACID-NACL 1000-0.7 MG/100ML-% IV SOLN
1000.0000 mg | INTRAVENOUS | Status: AC
  Administered 2020-06-09: 1000 mg via INTRAVENOUS

## 2020-06-09 MED ORDER — ENSURE PRE-SURGERY PO LIQD: 296.0000 mL | Freq: Once | ORAL | Status: DC

## 2020-06-09 MED ORDER — FENTANYL CITRATE (PF) 250 MCG/5ML IJ SOLN
INTRAMUSCULAR | Status: DC | PRN
  Administered 2020-06-09: 25 ug via INTRAVENOUS
  Administered 2020-06-09: 50 ug via INTRAVENOUS
  Administered 2020-06-09: 25 ug via INTRAVENOUS

## 2020-06-09 MED ORDER — CEFAZOLIN SODIUM-DEXTROSE 2-4 GM/100ML-% IV SOLN
INTRAVENOUS | Status: AC
  Filled 2020-06-09: qty 100

## 2020-06-09 MED ORDER — ACETAMINOPHEN 325 MG PO TABS
325.0000 mg | ORAL_TABLET | Freq: Four times a day (QID) | ORAL | Status: DC | PRN
  Administered 2020-06-11: 650 mg via ORAL
  Filled 2020-06-09: qty 2

## 2020-06-09 MED ORDER — FENTANYL CITRATE (PF) 100 MCG/2ML IJ SOLN
INTRAMUSCULAR | Status: AC
  Administered 2020-06-09: 25 ug via INTRAVENOUS
  Filled 2020-06-09: qty 2

## 2020-06-09 MED ORDER — HYDROCODONE-ACETAMINOPHEN 7.5-325 MG PO TABS
1.0000 | ORAL_TABLET | ORAL | Status: DC | PRN
  Administered 2020-06-12: 1 via ORAL
  Filled 2020-06-09 (×2): qty 1

## 2020-06-09 MED ORDER — HYDROCODONE-ACETAMINOPHEN 5-325 MG PO TABS
1.0000 | ORAL_TABLET | Freq: Four times a day (QID) | ORAL | Status: DC | PRN
  Filled 2020-06-09: qty 1

## 2020-06-09 MED ORDER — ORAL CARE MOUTH RINSE: 15.0000 mL | Freq: Once | OROMUCOSAL | Status: AC

## 2020-06-09 MED ORDER — POLYETHYLENE GLYCOL 3350 17 G PO PACK: 17.0000 g | PACK | Freq: Every day | ORAL | Status: DC | PRN

## 2020-06-09 MED ORDER — DEXAMETHASONE SODIUM PHOSPHATE 10 MG/ML IJ SOLN
INTRAMUSCULAR | Status: DC | PRN
  Administered 2020-06-09: 4 mg via INTRAVENOUS

## 2020-06-09 MED ORDER — ENOXAPARIN SODIUM 40 MG/0.4ML ~~LOC~~ SOLN: 40.0000 mg | Freq: Every day | SUBCUTANEOUS | 13 refills | Status: DC

## 2020-06-09 MED ORDER — ACETAMINOPHEN 10 MG/ML IV SOLN
INTRAVENOUS | Status: AC
  Filled 2020-06-09: qty 100

## 2020-06-09 MED ORDER — HYDROCODONE-ACETAMINOPHEN 5-325 MG PO TABS
1.0000 | ORAL_TABLET | ORAL | Status: DC | PRN
  Administered 2020-06-09 – 2020-06-10 (×2): 1 via ORAL
  Filled 2020-06-09: qty 1

## 2020-06-09 MED ORDER — ACETAMINOPHEN 500 MG PO TABS
ORAL_TABLET | ORAL | Status: AC
  Filled 2020-06-09: qty 1

## 2020-06-09 MED ORDER — EPHEDRINE SULFATE-NACL 50-0.9 MG/10ML-% IV SOSY
PREFILLED_SYRINGE | INTRAVENOUS | Status: DC | PRN
  Administered 2020-06-09 (×2): 5 mg via INTRAVENOUS

## 2020-06-09 MED ORDER — ONDANSETRON HCL 4 MG/2ML IJ SOLN: 4.0000 mg | Freq: Four times a day (QID) | INTRAMUSCULAR | Status: DC | PRN

## 2020-06-09 MED ORDER — METHOCARBAMOL 500 MG PO TABS: 500.0000 mg | ORAL_TABLET | Freq: Four times a day (QID) | ORAL | Status: DC | PRN

## 2020-06-09 MED ORDER — TRANEXAMIC ACID-NACL 1000-0.7 MG/100ML-% IV SOLN
INTRAVENOUS | Status: AC
  Filled 2020-06-09: qty 100

## 2020-06-09 MED ORDER — BISACODYL 5 MG PO TBEC: 5.0000 mg | DELAYED_RELEASE_TABLET | Freq: Every day | ORAL | Status: DC | PRN

## 2020-06-09 MED ORDER — LIDOCAINE 2% (20 MG/ML) 5 ML SYRINGE
INTRAMUSCULAR | Status: DC | PRN
  Administered 2020-06-09: 40 mg via INTRAVENOUS

## 2020-06-09 MED ORDER — PHENOL 1.4 % MT LIQD: 1.0000 | OROMUCOSAL | Status: DC | PRN

## 2020-06-09 MED ORDER — CHLORHEXIDINE GLUCONATE 0.12 % MT SOLN
OROMUCOSAL | Status: AC
  Administered 2020-06-09: 15 mL via OROMUCOSAL
  Filled 2020-06-09: qty 15

## 2020-06-09 MED ORDER — SUCCINYLCHOLINE CHLORIDE 200 MG/10ML IV SOSY
PREFILLED_SYRINGE | INTRAVENOUS | Status: DC | PRN
Start: 2020-06-09 — End: 2020-06-09
  Administered 2020-06-09: 80 mg via INTRAVENOUS

## 2020-06-09 MED ORDER — SUGAMMADEX SODIUM 200 MG/2ML IV SOLN
INTRAVENOUS | Status: DC | PRN
  Administered 2020-06-09: 110 mg via INTRAVENOUS

## 2020-06-09 MED ORDER — MENTHOL 3 MG MT LOZG: 1.0000 | LOZENGE | OROMUCOSAL | Status: DC | PRN

## 2020-06-09 MED ORDER — ONDANSETRON HCL 4 MG/2ML IJ SOLN
INTRAMUSCULAR | Status: AC
  Filled 2020-06-09: qty 2

## 2020-06-09 MED ORDER — PHENYLEPHRINE HCL-NACL 10-0.9 MG/250ML-% IV SOLN
INTRAVENOUS | Status: DC | PRN
Start: 2020-06-09 — End: 2020-06-09
  Administered 2020-06-09: 25 ug/min via INTRAVENOUS

## 2020-06-09 MED ORDER — MORPHINE SULFATE (PF) 2 MG/ML IV SOLN: 1.0000 mg | INTRAVENOUS | Status: DC | PRN

## 2020-06-09 MED ORDER — LOSARTAN POTASSIUM 50 MG PO TABS
50.0000 mg | ORAL_TABLET | Freq: Every day | ORAL | Status: DC
  Filled 2020-06-09 (×2): qty 1

## 2020-06-09 MED ORDER — EPHEDRINE 5 MG/ML INJ
INTRAVENOUS | Status: AC
  Filled 2020-06-09: qty 10

## 2020-06-09 MED ORDER — POVIDONE-IODINE 10 % EX SWAB
2.0000 "application " | Freq: Once | CUTANEOUS | Status: AC
  Administered 2020-06-09: 2 via TOPICAL

## 2020-06-09 MED ORDER — FENTANYL CITRATE (PF) 100 MCG/2ML IJ SOLN
50.0000 ug | INTRAMUSCULAR | Status: DC | PRN
  Administered 2020-06-09: 50 ug via INTRAVENOUS
  Filled 2020-06-09: qty 2

## 2020-06-09 MED ORDER — POTASSIUM CHLORIDE CRYS ER 20 MEQ PO TBCR
40.0000 meq | EXTENDED_RELEASE_TABLET | ORAL | Status: AC
  Administered 2020-06-09: 40 meq via ORAL
  Filled 2020-06-09: qty 2

## 2020-06-09 MED ORDER — FENTANYL CITRATE (PF) 250 MCG/5ML IJ SOLN
INTRAMUSCULAR | Status: AC
  Filled 2020-06-09: qty 5

## 2020-06-09 MED ORDER — CHLORHEXIDINE GLUCONATE 0.12 % MT SOLN: 15.0000 mL | Freq: Once | OROMUCOSAL | Status: AC

## 2020-06-09 MED ORDER — ACETAMINOPHEN 500 MG PO TABS
500.0000 mg | ORAL_TABLET | Freq: Four times a day (QID) | ORAL | Status: AC
  Administered 2020-06-09 – 2020-06-10 (×2): 500 mg via ORAL
  Filled 2020-06-09 (×3): qty 1

## 2020-06-09 MED ORDER — METOPROLOL SUCCINATE ER 50 MG PO TB24
50.0000 mg | ORAL_TABLET | Freq: Every day | ORAL | Status: DC
  Administered 2020-06-10 – 2020-06-12 (×3): 50 mg via ORAL
  Filled 2020-06-09 (×3): qty 1

## 2020-06-09 MED ORDER — DOCUSATE SODIUM 100 MG PO CAPS
100.0000 mg | ORAL_CAPSULE | Freq: Two times a day (BID) | ORAL | Status: DC
  Administered 2020-06-09 – 2020-06-12 (×6): 100 mg via ORAL
  Filled 2020-06-09 (×5): qty 1

## 2020-06-09 MED ORDER — SODIUM CHLORIDE 0.9 % IV SOLN: INTRAVENOUS | Status: DC

## 2020-06-09 MED ORDER — DOCUSATE SODIUM 100 MG PO CAPS
100.0000 mg | ORAL_CAPSULE | Freq: Two times a day (BID) | ORAL | Status: DC
  Filled 2020-06-09: qty 1

## 2020-06-09 MED ORDER — ALUM & MAG HYDROXIDE-SIMETH 200-200-20 MG/5ML PO SUSP: 30.0000 mL | ORAL | Status: DC | PRN

## 2020-06-09 MED ORDER — DONEPEZIL HCL 10 MG PO TABS
10.0000 mg | ORAL_TABLET | Freq: Every day | ORAL | Status: DC
  Administered 2020-06-09 – 2020-06-12 (×4): 10 mg via ORAL
  Filled 2020-06-09 (×4): qty 1

## 2020-06-09 MED ORDER — ACETAMINOPHEN 10 MG/ML IV SOLN
INTRAVENOUS | Status: DC | PRN
  Administered 2020-06-09: 1000 mg via INTRAVENOUS

## 2020-06-09 MED ORDER — HYDROCODONE-ACETAMINOPHEN 5-325 MG PO TABS: 1.0000 | ORAL_TABLET | Freq: Three times a day (TID) | ORAL | 0 refills | Status: DC | PRN

## 2020-06-09 MED ORDER — LACTATED RINGERS IV SOLN: INTRAVENOUS | Status: DC

## 2020-06-09 MED ORDER — OXYCODONE HCL 5 MG PO TABS: 5.0000 mg | ORAL_TABLET | Freq: Once | ORAL | Status: AC | PRN

## 2020-06-09 MED ORDER — OXYCODONE HCL 5 MG PO TABS
ORAL_TABLET | ORAL | Status: AC
  Administered 2020-06-09: 5 mg via ORAL
  Filled 2020-06-09: qty 1

## 2020-06-09 MED ORDER — PROPOFOL 10 MG/ML IV BOLUS
INTRAVENOUS | Status: DC | PRN
  Administered 2020-06-09: 80 mg via INTRAVENOUS

## 2020-06-09 MED ORDER — 0.9 % SODIUM CHLORIDE (POUR BTL) OPTIME
TOPICAL | Status: DC | PRN
  Administered 2020-06-09: 1000 mL

## 2020-06-09 MED ORDER — MAGNESIUM CITRATE PO SOLN: 1.0000 | Freq: Once | ORAL | Status: DC | PRN

## 2020-06-09 MED ORDER — ONDANSETRON HCL 4 MG/2ML IJ SOLN
INTRAMUSCULAR | Status: DC | PRN
  Administered 2020-06-09: 4 mg via INTRAVENOUS

## 2020-06-09 MED ORDER — METHOCARBAMOL 500 MG PO TABS
500.0000 mg | ORAL_TABLET | Freq: Four times a day (QID) | ORAL | Status: DC | PRN
  Administered 2020-06-10: 500 mg via ORAL
  Filled 2020-06-09: qty 1

## 2020-06-09 MED ORDER — DEXAMETHASONE SODIUM PHOSPHATE 10 MG/ML IJ SOLN
INTRAMUSCULAR | Status: AC
  Filled 2020-06-09: qty 1

## 2020-06-09 SURGICAL SUPPLY — 47 items
BIT DRILL INTERTAN LAG SCREW (BIT) ×3 IMPLANT
BIT DRILL SHORT 4.0 (BIT) ×1 IMPLANT
BNDG COHESIVE 4X5 TAN STRL (GAUZE/BANDAGES/DRESSINGS) ×3 IMPLANT
BNDG GAUZE ELAST 4 BULKY (GAUZE/BANDAGES/DRESSINGS) ×3 IMPLANT
COVER PERINEAL POST (MISCELLANEOUS) ×3 IMPLANT
COVER SURGICAL LIGHT HANDLE (MISCELLANEOUS) ×3 IMPLANT
DRAPE C-ARMOR (DRAPES) ×3 IMPLANT
DRAPE STERI IOBAN 125X83 (DRAPES) ×3 IMPLANT
DRILL BIT SHORT 4.0 (BIT) ×3
DRSG AQUACEL AG ADV 3.5X 4 (GAUZE/BANDAGES/DRESSINGS) ×9 IMPLANT
DRSG MEPILEX BORDER 4X4 (GAUZE/BANDAGES/DRESSINGS) ×3 IMPLANT
DRSG MEPILEX BORDER 4X8 (GAUZE/BANDAGES/DRESSINGS) ×3 IMPLANT
DRSG PAD ABDOMINAL 8X10 ST (GAUZE/BANDAGES/DRESSINGS) ×6 IMPLANT
DURAPREP 26ML APPLICATOR (WOUND CARE) ×3 IMPLANT
ELECT REM PT RETURN 9FT ADLT (ELECTROSURGICAL) ×3
ELECTRODE REM PT RTRN 9FT ADLT (ELECTROSURGICAL) ×1 IMPLANT
GLOVE BIOGEL PI IND STRL 7.0 (GLOVE) ×4 IMPLANT
GLOVE BIOGEL PI IND STRL 7.5 (GLOVE) ×2 IMPLANT
GLOVE BIOGEL PI INDICATOR 7.0 (GLOVE) ×8
GLOVE BIOGEL PI INDICATOR 7.5 (GLOVE) ×4
GLOVE ECLIPSE 7.0 STRL STRAW (GLOVE) ×3 IMPLANT
GLOVE SKINSENSE NS SZ7.5 (GLOVE) ×4
GLOVE SKINSENSE STRL SZ7.5 (GLOVE) ×2 IMPLANT
GLOVE SS N UNI LF 7.0 STRL (GLOVE) ×3 IMPLANT
GOWN STRL REIN XL XLG (GOWN DISPOSABLE) ×3 IMPLANT
GUIDE PIN 3.2X343 (PIN) ×2
GUIDE PIN 3.2X343MM (PIN) ×6
GUIDE ROD 3.0 (MISCELLANEOUS) ×3
KIT BASIN OR (CUSTOM PROCEDURE TRAY) ×3 IMPLANT
KIT TURNOVER KIT B (KITS) ×3 IMPLANT
NAIL LEFT 10X36 (Nail) ×3 IMPLANT
NS IRRIG 1000ML POUR BTL (IV SOLUTION) ×3 IMPLANT
PACK GENERAL/GYN (CUSTOM PROCEDURE TRAY) ×3 IMPLANT
PAD CAST 4YDX4 CTTN HI CHSV (CAST SUPPLIES) ×2 IMPLANT
PADDING CAST COTTON 4X4 STRL (CAST SUPPLIES) ×6
PIN GUIDE 3.2X343MM (PIN) ×2 IMPLANT
ROD GUIDE 3.0 (MISCELLANEOUS) ×1 IMPLANT
SCREW LAG COMPR KIT 85/80 (Screw) ×3 IMPLANT
SCREW TRIGEN LOW PROF 5.0X40 (Screw) ×3 IMPLANT
STAPLER VISISTAT 35W (STAPLE) ×3 IMPLANT
SUT VIC AB 0 CT1 27 (SUTURE) ×3
SUT VIC AB 0 CT1 27XBRD ANBCTR (SUTURE) ×1 IMPLANT
SUT VIC AB 2-0 CT1 27 (SUTURE) ×3
SUT VIC AB 2-0 CT1 TAPERPNT 27 (SUTURE) ×1 IMPLANT
TOWEL GREEN STERILE (TOWEL DISPOSABLE) ×3 IMPLANT
TOWEL GREEN STERILE FF (TOWEL DISPOSABLE) ×3 IMPLANT
WATER STERILE IRR 1000ML POUR (IV SOLUTION) ×3 IMPLANT

## 2020-06-09 NOTE — H&P (Signed)
History and Physical    Amanda Salas IFO:[id number] DOB: 01/03/1927 DOA: 06/09/2020  PCP: Amanda Greenspan, MD Consultants:  Amanda Salas- orthopedics Patient coming from:  Home - lives with granddaughter and her family; NOK: Daughter, [phone]  Chief Complaint: Fall  HPI: Amanda Salas is a 86 y.o. female with medical history significant of HTN; HLD; dementia; and thrombocytopenia presenting with fall.  Her daughter requests a spinal, does not want her put to sleep  Sometime in the middle of the night, she went to get water and went to open a cabinet and she reported that her hip gave away.  Her granddaughter found her in the floor.  She was laying on the right side, but her fracture is on the left.  She was feeling well prior.  She did not go to rehab after the last hip fracture.  They would prefer [facility] this time.   ED Course:  [place] and broke hip, h/o the same on the other side in 2019.  Dr. Erlinda Salas appears likely to operate today.  Head CT pending.   Review of Systems: As per HPI; otherwise review of systems reviewed and negative. Very confused and reports pain in the hip.  Ambulatory Status:  Ambulates without assistance  COVID Vaccine Status:  Complete  Past Medical History:  Diagnosis Date  . Allergy   . Arthritis   . Cataract   . Dementia ([place])   . Disorder of bone and cartilage, unspecified   . GERD (gastroesophageal reflux disease)   . Hepatitis   . Hyperlipidemia   . Hypertension   . Thrombocytopenia, unspecified ([place])     Past Surgical History:  Procedure Laterality Date  . CARDIAC CATHETERIZATION    . ESOPHAGOGASTRODUODENOSCOPY    . TOTAL HIP ARTHROPLASTY Right 10/31/2017   Procedure: TOTAL HIP ARTHROPLASTY DIRECT ANTERIOR APPROACH;  Surgeon: Amanda Koyanagi, MD;  Location: [facility];  Service: Orthopedics;  Laterality: Right;    Social History   Socioeconomic History  . Marital status: Divorced    Spouse name: Not on file  . Number of children: 1  . Years  of education: Not on file  . Highest education level: Not on file  Occupational History  . Occupation: retired  Tobacco Use  . Smoking status: Never Smoker  . Smokeless tobacco: Never Used  Vaping Use  . Vaping Use: Never used  Substance and Sexual Activity  . Alcohol use: No    Alcohol/week: 0.0 standard drinks  . Drug use: No  . Sexual activity: Never  Other Topics Concern  . Not on file  Social History Narrative   does word puzzles to keep memory sharp    enjoys reading   is active but no longer drives    Social Determinants of Health   Financial Resource Strain:   . Difficulty of Paying Living Expenses:   Food Insecurity:   . Worried About Charity fundraiser in the Last Year:   . Arboriculturist in the Last Year:   Transportation Needs:   . Film/video editor (Medical):   Marland Kitchen Lack of Transportation (Non-Medical):   Physical Activity:   . Days of Exercise per Week:   . Minutes of Exercise per Session:   Stress:   . Feeling of Stress :   Social Connections:   . Frequency of Communication with Friends and Family:   . Frequency of Social Gatherings with Friends and Family:   . Attends Religious Services:   .  Active Member of Clubs or Organizations:   . Attends Archivist Meetings:   Marland Kitchen Marital Status:   Intimate Partner Violence:   . Fear of Current or Ex-Partner:   . Emotionally Abused:   Marland Kitchen Physically Abused:   . Sexually Abused:     Allergies  Allergen Reactions  . Ace Inhibitors     REACTION: cough  . Alendronate Sodium     REACTION: pill got stuck    Family History  Problem Relation Age of Onset  . Alzheimer's disease Mother     Prior to Admission medications   Medication Sig Start Date End Date Taking? Authorizing Provider  acetaminophen (TYLENOL) 325 MG tablet Take 650 mg by mouth every 6 (six) hours as needed.   Yes [provider]  Cholecalciferol (VITAMIN D3 PO) Take 1,000 Units daily by mouth.    Yes [provider]  donepezil (ARICEPT) 10 MG tablet Take 1 tablet (10 mg total) by mouth at bedtime. 09/19/19  Yes Salas, Amanda Fanny, MD  losartan (COZAAR) 50 MG tablet Take 1 tablet (50 mg total) by mouth daily. 09/19/19  Yes Salas, Amanda Fanny, MD  metoprolol succinate (TOPROL-XL) 50 MG 24 hr tablet Take with or immediately following a meal. Patient taking differently: Take 50 mg by mouth daily. Take with or immediately following a meal. 09/19/19  Yes Salas, Amanda Fanny, MD  polyethylene glycol (MIRALAX / GLYCOLAX) packet Take 17 g daily as needed by mouth. Patient taking differently: Take 17 g by mouth daily as needed for mild constipation.  11/03/17  Yes Amanda Polite, MD  simvastatin (ZOCOR) 20 MG tablet Take 1 tablet (20 mg total) by mouth at bedtime. 09/19/19  Yes Salas, Amanda Fanny, MD    Physical Exam: Vitals:   06/09/20 0844 06/09/20 0846 06/09/20 0901 06/09/20 0916  BP:  (!) 137/53 137/69   Pulse: (!) 57 (!) 55 63 60  Resp: 20 (!) 27 (!) 24 (!) 25  Temp:      TempSrc:      SpO2: 99% 98% 99% 98%  Weight:      Height:         . General:  Appears calm and comfortable and is NAD; does not remember where she is or why and moves periodically with obvious discomfort . Eyes:  PERRL, EOMI, normal lids, iris . ENT:  grossly normal hearing, lips & tongue, mmm . Neck:  no LAD, masses or thyromegaly . Cardiovascular:  RRR, no m/r/g. No LE edema.  Marland Kitchen Respiratory:   CTA bilaterally with no wheezes/rales/rhonchi.  Normal respiratory effort. . Abdomen:  soft, NT, ND, NABS . Skin:  no rash or induration seen on limited exam . Musculoskeletal:  Left hip with ?deformity, left leg shortening and external rotation . Lower extremity:  No LE edema.  Limited foot exam with no ulcerations.  2+ distal pulses. Marland Kitchen Psychiatric:  grossly normal mood and affect, speech fluent and appropriate but clearly with dementia, AOx1 . Neurologic:  CN 2-12 grossly intact, moves all extremities in coordinated fashion other than painful  LLE    Radiological Exams on Admission: DG Chest 1 View  Result Date: 06/09/2020 CLINICAL DATA:  Hip fracture EXAM: CHEST  1 VIEW COMPARISON:  None. FINDINGS: Normal mediastinum and cardiac silhouette. Normal pulmonary vasculature. No evidence of effusion, infiltrate, or pneumothorax. No acute bony abnormality. IMPRESSION: No acute cardiopulmonary process. Electronically Signed   By: Suzy Bouchard M.D.   On: 06/09/2020 07:42   CT HEAD WO  CONTRAST  Result Date: 06/09/2020 CLINICAL DATA:  LEFT hip fracture.  Fall. EXAM: CT HEAD WITHOUT CONTRAST TECHNIQUE: Contiguous axial images were obtained from the base of the skull through the vertex without intravenous contrast. COMPARISON:  None. FINDINGS: Brain: No acute intracranial hemorrhage. No focal mass lesion. No CT evidence of acute infarction. No midline shift or mass effect. No hydrocephalus. Basilar cisterns are patent. There are periventricular and subcortical white matter hypodensities. Generalized cortical atrophy. Vascular: No hyperdense vessel or unexpected calcification. Skull: Normal. Negative for fracture or focal lesion. Sinuses/Orbits: Paranasal sinuses and mastoid air cells are clear. Orbits are clear. Other: None. IMPRESSION: 1. No acute intracranial findings. 2. Atrophy and white matter microvascular disease. Electronically Signed   By: Suzy Bouchard M.D.   On: 06/09/2020 07:57   DG Hip Unilat With Pelvis 2-3 Views Left  Result Date: 06/09/2020 CLINICAL DATA:  Fall at home. EXAM: DG HIP (WITH OR WITHOUT PELVIS) 2-3V LEFT COMPARISON:  None. FINDINGS: Intertrochanteric fracture of the LEFT femur with varus angulation. RIGHT hip arthroplasty noted. Vascular calcifications IMPRESSION: LEFT intertrochanteric femur fracture. Electronically Signed   By: Suzy Bouchard M.D.   On: 06/09/2020 07:41    EKG: Independently reviewed.  NSR with rate 57; LBBB with no evidence of acute ischemia   Labs on Admission: I have personally reviewed  the available labs and imaging studies at the time of the admission.  Pertinent labs:   K+ 3.4 Glucose 137 BUN 15/Creatinine 0.98/GFR 51 WBC 10.4 Hgb 11.0   Assessment/Plan Principal Problem:   Closed comminuted intertrochanteric fracture of left femur (HCC) Active Problems:   Hyperlipidemia   Essential hypertension   Dementia without behavioral disturbance (HCC)   Stage 3a chronic kidney disease   Hyperglycemia   L Hip fracture -Mechanical fall resulting in hip fracture; prior h/o R hip fracture in 2019 s/p repair and no apparent injury to the prior repair -Orthopedics consulted, Dr. Erlinda Salas is planning to take to the OR today -NPO in anticipation of surgical repair  -SCDs overnight, start Lovenox post-operatively (or as per ortho) -Pain control with Robxain, Vicodin, and Morphine prn -TOC team consult for rehab placement - prefers [facility] -Will need PT consult post-operatively -Hip fracture order set utilized  Dementia -Moderate dementia based on today's exam -She appears likely to need SNF rehab -Continue Aricept  HTN -Continue Toprol, Cozaar  HLD -Continue Zoloft  Stage 3a CKD -No recent values available but based on remote values this appears to be chronic and likely stable -Will repeat labs in AM  Hyperglycemia -May be stress response -Will follow with fasting AM labs -It is unlikely that she will need acute or chronic treatment for this issue    Note: This patient has been tested and is negative for the novel coronavirus COVID-19.   DVT prophylaxis:  SCDs until approved for Lovenox by orthopedics Code Status:  DNR - confirmed with family Family Communication: Daughter was present throughout evaluation Disposition Plan:  Home once clinically improved Consults called: Orthopedics; TOC team, Nutrition; will need PT post-operatively  Admission status: Admit - It is my clinical opinion that admission to INPATIENT is reasonable and necessary because of  the expectation that this patient will require hospital care that crosses at least 2 midnights to treat this condition based on the medical complexity of the problems presented.  Given the aforementioned information, the predictability of an adverse outcome is felt to be significant.     Karmen Bongo MD [facility]   How to  contact the [facility] Attending or Consulting provider [place] or covering provider during after hours [place], for this patient?  1. Check the care team in [facility] and look for a) attending/consulting TR[facility] provider listed and b) the [facility] team listed 2. Log into [email or web address] and use 's universal password to access. If you do not have the password, please contact the hospital operator. 3. Locate the [facility] provider you are looking for under [facility] and page to a number that you can be directly reached. 4. If you still have difficulty reaching the provider, please page the [facility] (Director on Call) for the Hospitalists listed on amion for assistance.   06/09/2020, 11:17 AM

## 2020-06-09 NOTE — Anesthesia Preprocedure Evaluation (Signed)
Anesthesia Evaluation  Patient identified by MRN, date of birth, ID band Patient awake    Reviewed: Allergy & Precautions, H&P , NPO status , Patient's Chart, lab work & pertinent test results  Airway Mallampati: II   Neck ROM: full    Dental   Pulmonary neg pulmonary ROS,    breath sounds clear to auscultation       Cardiovascular hypertension,  Rhythm:regular Rate:Normal     Neuro/Psych PSYCHIATRIC DISORDERS Dementia    GI/Hepatic GERD  ,(+) Hepatitis -  Endo/Other    Renal/GU      Musculoskeletal  (+) Arthritis , Hip fx   Abdominal   Peds  Hematology PLTS 89   Anesthesia Other Findings   Reproductive/Obstetrics                             Anesthesia Physical Anesthesia Plan  ASA: III  Anesthesia Plan: General   Post-op Pain Management:    Induction: Intravenous  PONV Risk Score and Plan: 3 and Ondansetron and Dexamethasone  Airway Management Planned: Oral ETT  Additional Equipment:   Intra-op Plan:   Post-operative Plan: Extubation in OR  Informed Consent: I have reviewed the patients History and Physical, chart, labs and discussed the procedure including the risks, benefits and alternatives for the proposed anesthesia with the patient or authorized representative who has indicated his/her understanding and acceptance.       Plan Discussed with: CRNA, Anesthesiologist and Surgeon  Anesthesia Plan Comments:         Anesthesia Quick Evaluation

## 2020-06-09 NOTE — Op Note (Signed)
   Date of Surgery: 06/09/2020  INDICATIONS: Ms. Kesinger is a 86 y.o.-year-old female who sustained a left hip fracture. The risks and benefits of the procedure discussed with the family prior to the procedure and all questions were answered; consent was obtained.  PREOPERATIVE DIAGNOSIS: left pertrochanteric/subtrochanteric hip fracture   POSTOPERATIVE DIAGNOSIS: Same   PROCEDURE: Open treatment of pertrochanteric,subtrochanteric fracture with intramedullary implant. CPT [id number]   SURGEON: N. Eduard Roux, M.D.   ASSIST: none  ANESTHESIA: general   IV FLUIDS AND URINE: See anesthesia record   ESTIMATED BLOOD LOSS: 300 cc  IMPLANTS: [employer] InterTAN 10 x 36, 85/80 lag screws  DRAINS: None.   COMPLICATIONS: see description of procedure.   DESCRIPTION OF PROCEDURE: The patient was brought to the operating room and placed supine on the operating table. The patient's leg had been signed prior to the procedure. The patient had the anesthesia placed by the anesthesiologist. The prep verification and incision time-outs were performed to confirm that this was the correct patient, site, side and location. The patient had an SCD on the opposite lower extremity. The patient did receive antibiotics prior to the incision and was re-dosed during the procedure as needed at indicated intervals. The patient was positioned on the fracture table with the table in traction and internal rotation to reduce the hip. The well leg was placed in a scissor position and all bony prominences were well-padded. The patient had the lower extremity prepped and draped in the standard surgical fashion.  An incision was made on the lateral aspect of the fracture and a bone hook was used to reduce the fracture.  A separate incision was made 4 finger breadths superior to the greater trochanter. A guide pin was inserted into the tip of the greater trochanter under fluoroscopic guidance. An opening reamer was used to gain  access to the femoral canal. The nail length was measured and inserted down the femoral canal to its proper depth. The appropriate version of insertion for the lag screw was found under fluoroscopy. A pin was inserted up the femoral neck through the jig. Then, a second antirotation pin was inserted inferior to the first pin. The length of the lag screw was then measured. The lag screw was inserted as near to center-center in the head as possible. The antirotation pin was then taken out and an interdigitating compression screw was placed in its place. The leg was taken out of traction, then the interdigitating compression screw was used to compress across the fracture. Compression was visualized on serial xrays.  A distal interlocking screw was placed using the perfect circle technique.  The wound was copiously irrigated with saline and the subcutaneous layer closed with 2.0 vicryl and the skin was reapproximated with staples. The wounds were cleaned and dried a final time and a sterile dressing was placed. The hip was taken through a range of motion at the end of the case under fluoroscopic imaging to visualize the approach-withdraw phenomenon and confirm implant length in the head. The patient was then awakened from anesthesia and taken to the recovery room in stable condition. All counts were correct at the end of the case.   POSTOPERATIVE PLAN: The patient will be weight bearing as tolerated and will return in 2 weeks for staple removal and the patient will receive DVT prophylaxis based on other medications, activity level, and risk ratio of bleeding to thrombosis.   Azucena Cecil, MD [facility] 12:58 PM

## 2020-06-09 NOTE — Anesthesia Procedure Notes (Signed)
Procedure Name: Intubation Date/Time: 06/09/2020 11:37 AM Performed by: Renato Shin, CRNA Pre-anesthesia Checklist: Patient identified, Emergency Drugs available, Suction available and Patient being monitored Patient Re-evaluated:Patient Re-evaluated prior to induction Oxygen Delivery Method: Circle system utilized Preoxygenation: Pre-oxygenation with 100% oxygen Induction Type: IV induction Ventilation: Mask ventilation without difficulty Laryngoscope Size: Miller and 2 Grade View: Grade I Tube type: Oral Tube size: 7.0 mm Number of attempts: 1 Airway Equipment and Method: Stylet Placement Confirmation: ETT inserted through vocal cords under direct vision,  positive ETCO2 and breath sounds checked- equal and bilateral Secured at: 21 cm Tube secured with: Tape Dental Injury: Teeth and Oropharynx as per pre-operative assessment

## 2020-06-09 NOTE — Transfer of Care (Signed)
Immediate Anesthesia Transfer of Care Note  Patient: Amanda Salas  Procedure(s) Performed: INTRAMEDULLARY (IM) NAIL INTERTROCHANTRIC (Left )  Patient Location: PACU  Anesthesia Type:General  Level of Consciousness: awake and patient cooperative  Airway & Oxygen Therapy: Patient Spontanous Breathing and Patient connected to face mask oxygen  Post-op Assessment: Report given to RN and Post -op Vital signs reviewed and stable  Post vital signs: Reviewed and stable  Last Vitals:  Vitals Value Taken Time  BP 154/56 06/09/20 1307  Temp 36.4 C 06/09/20 1300  Pulse 64 06/09/20 1311  Resp 23 06/09/20 1311  SpO2 100 % 06/09/20 1311  Vitals shown include unvalidated device data.  Last Pain:  Vitals:   06/09/20 1300  TempSrc:   PainSc: 9       Patients Stated Pain Goal: 1 (76/72/09 4709)  Complications: No complications documented.

## 2020-06-09 NOTE — ED Triage Notes (Signed)
Pt. Transported [facility] from home c/o of pain in L hip after falling when reaching for something in her lower cabinet. Hx. R hip fracture. Pt. A&O x3

## 2020-06-09 NOTE — Consult Note (Signed)
ORTHOPAEDIC CONSULTATION  REQUESTING PHYSICIAN: Karmen Bongo, MD  Chief Complaint: Left hip fracture  HPI: Amanda Salas is a 86 y.o. female with medical history significant of HTN; HLD; dementia; and thrombocytopenia presenting with fall.  Her daughter requests a spinal, does not want her put to sleep  Sometime in the middle of the night, she went to get water and went to open a cabinet and she reported that her hip gave away.  Her granddaughter found her in the floor.   Ortho consulted for surgical evaluation.  Past Medical History:  Diagnosis Date  . Allergy   . Arthritis   . Cataract   . Dementia ([place])   . Disorder of bone and cartilage, unspecified   . GERD (gastroesophageal reflux disease)   . Hepatitis   . Hyperlipidemia   . Hypertension   . Thrombocytopenia, unspecified ([place])    Past Surgical History:  Procedure Laterality Date  . CARDIAC CATHETERIZATION    . ESOPHAGOGASTRODUODENOSCOPY    . TOTAL HIP ARTHROPLASTY Right 10/31/2017   Procedure: TOTAL HIP ARTHROPLASTY DIRECT ANTERIOR APPROACH;  Surgeon: Leandrew Koyanagi, MD;  Location: [place];  Service: Orthopedics;  Laterality: Right;   Social History   Socioeconomic History  . Marital status: Divorced    Spouse name: Not on file  . Number of children: 1  . Years of education: Not on file  . Highest education level: Not on file  Occupational History  . Occupation: retired  Tobacco Use  . Smoking status: Never Smoker  . Smokeless tobacco: Never Used  Substance and Sexual Activity  . Alcohol use: No    Alcohol/week: 0.0 standard drinks  . Drug use: No  . Sexual activity: Never  Other Topics Concern  . Not on file  Social History Narrative   does word puzzles to keep memory sharp    enjoys reading   is active but no longer drives    Social Determinants of Health   Financial Resource Strain:   . Difficulty of Paying Living Expenses:   Food Insecurity:   . Worried About Charity fundraiser in the Last  Year:   . Arboriculturist in the Last Year:   Transportation Needs:   . Film/video editor (Medical):   Marland Kitchen Lack of Transportation (Non-Medical):   Physical Activity:   . Days of Exercise per Week:   . Minutes of Exercise per Session:   Stress:   . Feeling of Stress :   Social Connections:   . Frequency of Communication with Friends and Family:   . Frequency of Social Gatherings with Friends and Family:   . Attends Religious Services:   . Active Member of Clubs or Organizations:   . Attends Archivist Meetings:   Marland Kitchen Marital Status:    Family History  Problem Relation Age of Onset  . Alzheimer's disease Mother    Allergies  Allergen Reactions  . Ace Inhibitors     REACTION: cough  . Alendronate Sodium     REACTION: pill got stuck   Prior to Admission medications   Medication Sig Start Date End Date Taking? Authorizing Provider  acetaminophen (TYLENOL) 325 MG tablet Take 650 mg by mouth every 6 (six) hours as needed.   Yes [provider]  Cholecalciferol (VITAMIN D3 PO) Take 1,000 Units daily by mouth.    Yes [provider]  donepezil (ARICEPT) 10 MG tablet Take 1 tablet (10 mg total) by mouth at bedtime. 09/19/19  Yes Tower, Wynelle Fanny, MD  losartan (COZAAR) 50 MG tablet Take 1 tablet (50 mg total) by mouth daily. 09/19/19  Yes Tower, Wynelle Fanny, MD  metoprolol succinate (TOPROL-XL) 50 MG 24 hr tablet Take with or immediately following a meal. Patient taking differently: Take 50 mg by mouth daily. Take with or immediately following a meal. 09/19/19  Yes Tower, Wynelle Fanny, MD  polyethylene glycol (MIRALAX / GLYCOLAX) packet Take 17 g daily as needed by mouth. Patient taking differently: Take 17 g by mouth daily as needed for mild constipation.  11/03/17  Yes Domenic Polite, MD  simvastatin (ZOCOR) 20 MG tablet Take 1 tablet (20 mg total) by mouth at bedtime. 09/19/19  Yes Tower, Wynelle Fanny, MD   DG Chest 1 View  Result Date: 06/09/2020 CLINICAL DATA:  Hip  fracture EXAM: CHEST  1 VIEW COMPARISON:  None. FINDINGS: Normal mediastinum and cardiac silhouette. Normal pulmonary vasculature. No evidence of effusion, infiltrate, or pneumothorax. No acute bony abnormality. IMPRESSION: No acute cardiopulmonary process. Electronically Signed   By: Suzy Bouchard M.D.   On: 06/09/2020 07:42   CT HEAD WO CONTRAST  Result Date: 06/09/2020 CLINICAL DATA:  LEFT hip fracture.  Fall. EXAM: CT HEAD WITHOUT CONTRAST TECHNIQUE: Contiguous axial images were obtained from the base of the skull through the vertex without intravenous contrast. COMPARISON:  None. FINDINGS: Brain: No acute intracranial hemorrhage. No focal mass lesion. No CT evidence of acute infarction. No midline shift or mass effect. No hydrocephalus. Basilar cisterns are patent. There are periventricular and subcortical white matter hypodensities. Generalized cortical atrophy. Vascular: No hyperdense vessel or unexpected calcification. Skull: Normal. Negative for fracture or focal lesion. Sinuses/Orbits: Paranasal sinuses and mastoid air cells are clear. Orbits are clear. Other: None. IMPRESSION: 1. No acute intracranial findings. 2. Atrophy and white matter microvascular disease. Electronically Signed   By: Suzy Bouchard M.D.   On: 06/09/2020 07:57   DG Hip Unilat With Pelvis 2-3 Views Left  Result Date: 06/09/2020 CLINICAL DATA:  Fall at home. EXAM: DG HIP (WITH OR WITHOUT PELVIS) 2-3V LEFT COMPARISON:  None. FINDINGS: Intertrochanteric fracture of the LEFT femur with varus angulation. RIGHT hip arthroplasty noted. Vascular calcifications IMPRESSION: LEFT intertrochanteric femur fracture. Electronically Signed   By: Suzy Bouchard M.D.   On: 06/09/2020 07:41    All pertinent xrays, MRI, CT independently reviewed and interpreted  Positive ROS: All other systems have been reviewed and were otherwise negative with the exception of those mentioned in the HPI and as above.  Physical Exam: General: No  acute distress Cardiovascular: No pedal edema Respiratory: No cyanosis, no use of accessory musculature GI: No organomegaly, abdomen is soft and non-tender Skin: No lesions in the area of chief complaint Neurologic: Sensation intact distally Psychiatric: Patient is at baseline mood and affect Lymphatic: No axillary or cervical lymphadenopathy  MUSCULOSKELETAL:  - severe pain with movement of the hip and extremity - skin intact - NVI distally - compartments soft  Assessment: Left pertrochanteric femur fracture  Plan: - surgical treatment is recommended for pain relief, quality of life and early mobilization - patient and family are aware of r/b/a and wish to proceed, informed consent obtained - medical optimization per primary team - surgery is planned for today - continue NPO - will start lovenox in the morning  Thank you for the consult and the opportunity to see Amanda Salas  N. Eduard Roux, MD [facility] 10:52 AM

## 2020-06-09 NOTE — ED Provider Notes (Signed)
[place] EMERGENCY DEPARTMENT Provider Note   CSN: [id number] Arrival date & time:        History Chief Complaint  Patient presents with  . Fall   Level 5 caveat due to dementia Amanda Salas is a 86 y.o. female.  The history is provided by the patient and the EMS personnel. The history is limited by the condition of the patient.  Fall The problem occurs constantly. The problem has been gradually worsening. Pertinent negatives include no chest pain. Exacerbated by: Movement. Nothing relieves the symptoms.   Patient is a 86 year old female with history of hypertension hyperlipidemia presents after fall.  Patient was reaching for something in her lower cabinet when she fell.  It suspected that she injured her left hip.  Patient has a history of dementia and is difficult historian    Past Medical History:  Diagnosis Date  . Allergy   . Arthritis   . Cataract   . Disorder of bone and cartilage, unspecified   . GERD (gastroesophageal reflux disease)   . Hepatitis   . Hyperlipidemia   . Hypertension   . Thrombocytopenia, unspecified [facility])     Patient Active Problem List   Diagnosis Date Noted  . Medicare annual wellness visit, subsequent 08/30/2018  . Routine general medical examination at a health care facility 08/30/2018  . Advance care planning 08/30/2018  . H/O fracture of hip 10/30/2017  . Dystrophic nail 04/23/2015  . Encounter for Medicare annual wellness exam 04/17/2014  . Colon cancer screening 04/17/2014  . Anemia 04/17/2014  . History of vertebral compression fracture 12/26/2013  . Low back pain 11/22/2013  . 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 MCI (mild cognitive impairment) 12/29/2010  . Vitamin D deficiency 07/01/2010  . FALL, HX OF 07/01/2010  . Hyperlipidemia 08/10/2008  . Essential hypertension 08/10/2008  . ALLERGIC RHINITIS 08/10/2008  . GERD 08/10/2008  . Osteoporosis 08/10/2008    Past Surgical History:  Procedure  Laterality Date  . CARDIAC CATHETERIZATION    . ESOPHAGOGASTRODUODENOSCOPY    . TOTAL HIP ARTHROPLASTY Right 10/31/2017   Procedure: TOTAL HIP ARTHROPLASTY DIRECT ANTERIOR APPROACH;  Surgeon: Leandrew Koyanagi, MD;  Location: [place];  Service: Orthopedics;  Laterality: Right;     OB History   No obstetric history on file.     Family History  Problem Relation Age of Onset  . Alzheimer's disease Mother     Social History   Tobacco Use  . Smoking status: Never Smoker  . Smokeless tobacco: Never Used  Substance Use Topics  . Alcohol use: No    Alcohol/week: 0.0 standard drinks  . Drug use: No    Home Medications Prior to Admission medications   Medication Sig Start Date End Date Taking? Authorizing Provider  Acetaminophen (TYLENOL PO) Take 400 mg by mouth 2 (two) times daily as needed.    [provider]  Cholecalciferol (VITAMIN D3 PO) Take 1,000 Units daily by mouth.     [provider]  donepezil (ARICEPT) 10 MG tablet Take 1 tablet (10 mg total) by mouth at bedtime. 09/19/19   Tower, Wynelle Fanny, MD  losartan (COZAAR) 50 MG tablet Take 1 tablet (50 mg total) by mouth daily. 09/19/19   Tower, Wynelle Fanny, MD  metoprolol succinate (TOPROL-XL) 50 MG 24 hr tablet Take with or immediately following a meal. 09/19/19   Tower, Wynelle Fanny, MD  polyethylene glycol (MIRALAX / GLYCOLAX) packet Take 17 g daily as needed by mouth. 11/03/17  Domenic Polite, MD  simvastatin (ZOCOR) 20 MG tablet Take 1 tablet (20 mg total) by mouth at bedtime. 09/19/19   Tower, Wynelle Fanny, MD    Allergies    Ace inhibitors and Alendronate sodium  Review of Systems   Review of Systems  Unable to perform ROS: Dementia  Cardiovascular: Negative for chest pain.    Physical Exam Updated Vital Signs SpO2 97%   Physical Exam CONSTITUTIONAL: Elderly and frail HEAD: Normocephalic/atraumatic EYES: EOMI/PERRL ENMT: Mucous membranes moist NECK: supple no meningeal signs SPINE/BACK:entire spine  nontender CV: S1/S2 noted LUNGS: Lungs are clear to auscultation bilaterally, no apparent distress ABDOMEN: soft, nontender NEURO: Pt is awake/alert but appears confused, moves all extremitiesx4.  No facial droop.   EXTREMITIES: pulses normal/equal, tenderness with range of motion left hip.  Her left lower extremity is shortened and externally rotated SKIN: warm, color normal PSYCH: Unable to assess  ED Results / Procedures / Treatments   Labs (all labs ordered are listed, but only abnormal results are displayed) Labs Reviewed  BASIC METABOLIC PANEL - Abnormal; Notable for the following components:      Result Value   Potassium 3.4 (*)    Glucose, Bld 137 (*)    GFR calc non Af Amer 51 (*)    GFR calc Af Amer 59 (*)    All other components within normal limits  CBC WITH DIFFERENTIAL/PLATELET - Abnormal; Notable for the following components:   Hemoglobin 11.0 (*)    HCT 34.9 (*)    All other components within normal limits  SARS CORONAVIRUS 2 BY RT PCR (HOSPITAL ORDER, [place] [facility])  PROTIME-INR  TYPE AND SCREEN    EKG EKG Interpretation  Date/Time:  Sunday June 09 2020 06:14:46 EDT Ventricular Rate:  57 PR Interval:    QRS Duration: 143 QT Interval:  533 QTC Calculation: 520 R Axis:   -54 Text Interpretation: Sinus rhythm Atrial premature complex Left bundle branch block No significant change since last tracing Confirmed by Ripley Fraise [id number]) on 06/09/2020 6:27:11 AM   Radiology No results found.  Procedures Procedures    Medications Ordered in ED Medications  fentaNYL (SUBLIMAZE) injection 50 mcg (50 mcg Intravenous Given 06/09/20 0717)  ondansetron (ZOFRAN) injection 4 mg (4 mg Intravenous Given 06/09/20 0160)    ED Course  I have reviewed the triage vital signs and the nursing notes.  Pertinent labs & imaging results that were available during my care of the patient were reviewed by me and considered in my medical decision making  (see chart for details).    MDM Rules/Calculators/A&P                          5:23 AM Patient presents from home after fall.  Patient has an obvious left hip fracture.  It is unclear if she hit her head, patient is a poor historian, will proceed with CT head as well as plain films 6:43 AM Patient found to have left hip fracture.  I discussed the case with Dr. Erlinda Hong who will attempt orthopedic repair today. Patient will be admitted to the hospitalist 7:21 AM Signed out to the dr little with admission pending  Final Clinical Impression(s) / ED Diagnoses Final diagnoses:  Closed fracture of left hip, initial encounter [facility])    Rx / DC Orders ED Discharge Orders    None       Ripley Fraise, MD 06/09/20 [phone]

## 2020-06-09 NOTE — Anesthesia Postprocedure Evaluation (Signed)
Anesthesia Post Note  Patient: Amanda Salas  Procedure(s) Performed: INTRAMEDULLARY (IM) NAIL INTERTROCHANTRIC (Left )     Patient location during evaluation: PACU Anesthesia Type: General Level of consciousness: awake and alert Pain management: pain level controlled Vital Signs Assessment: post-procedure vital signs reviewed and stable Respiratory status: spontaneous breathing, nonlabored ventilation, respiratory function stable and patient connected to nasal cannula oxygen Cardiovascular status: blood pressure returned to baseline and stable Postop Assessment: no apparent nausea or vomiting Anesthetic complications: no   No complications documented.  Last Vitals:  Vitals:   06/09/20 1630 06/09/20 1910  BP: (!) 117/53 (!) 115/53  Pulse: 66 63  Resp:  16  Temp: 37.1 C 36.8 C  SpO2: 98% 93%    Last Pain:  Vitals:   06/09/20 1910  TempSrc: Oral  PainSc:                  Clayton S

## 2020-06-09 NOTE — Discharge Instructions (Signed)
° ° °  1. Change dressings as needed °2. May shower but keep incisions covered and dry °3. Take lovenox to prevent blood clots °4. Take stool softeners as needed °5. Take pain meds as needed ° °

## 2020-06-09 NOTE — ED Provider Notes (Signed)
I received this patient in signout from Dr. Christy Gentles.  She had presented with fall and at time of signout, had been diagnosed with left hip fracture, Dr. Erlinda Hong consulted and plans for operative repair potentially today. Awaiting admission to hospitalist. COVID-19 pending.  Discussed admission with [facility] hospitalist, Dr. Lorin Mercy. COVID-19 negative.   Jasmond River, Wenda Overland, MD 06/09/20 [phone]

## 2020-06-09 NOTE — Plan of Care (Signed)

## 2020-06-09 NOTE — Progress Notes (Signed)
I have discussed with Dr. Christy Gentles about patient's new injury.  I have posted the surgery for 11 am today pending preop clearance.  Appreciate hospitalist assistance.    Azucena Cecil, MD [facility] [phone] 7:26 AM

## 2020-06-10 ENCOUNTER — Encounter (HOSPITAL_COMMUNITY): Payer: Self-pay | Admitting: Internal Medicine

## 2020-06-10 DIAGNOSIS — D62 Acute posthemorrhagic anemia: Secondary | ICD-10-CM

## 2020-06-10 DIAGNOSIS — N179 Acute kidney failure, unspecified: Secondary | ICD-10-CM

## 2020-06-10 DIAGNOSIS — S72002A Fracture of unspecified part of neck of left femur, initial encounter for closed fracture: Secondary | ICD-10-CM

## 2020-06-10 LAB — BASIC METABOLIC PANEL
Anion gap: 9 (ref 5–15)
BUN: 19 mg/dL (ref 8–23)
CO2: 23 mmol/L (ref 22–32)
Calcium: 8 mg/dL — ABNORMAL LOW (ref 8.9–10.3)
Chloride: 103 mmol/L (ref 98–111)
Creatinine, Ser: 1.4 mg/dL — ABNORMAL HIGH (ref 0.44–1.00)
GFR calc Af Amer: 38 mL/min — ABNORMAL LOW (ref >60)
GFR calc non Af Amer: 33 mL/min — ABNORMAL LOW (ref >60)
Glucose, Bld: 161 mg/dL — ABNORMAL HIGH (ref 70–99)
Potassium: 3.5 mmol/L (ref 3.5–5.1)
Sodium: 135 mmol/L (ref 135–145)

## 2020-06-10 LAB — TYPE AND SCREEN
ABO/RH(D): AB POS
Antibody Screen: NEGATIVE
Unit division: 0
Unit division: 0

## 2020-06-10 LAB — CBC
HCT: 21.4 % — ABNORMAL LOW (ref 36.0–46.0)
Hemoglobin: 6.7 g/dL — CL (ref 12.0–15.0)
MCH: 27.3 pg (ref 26.0–34.0)
MCHC: 31.3 g/dL (ref 30.0–36.0)
MCV: 87.3 fL (ref 80.0–100.0)
Platelets: 85 10*3/uL — ABNORMAL LOW (ref 150–400)
RBC: 2.45 MIL/uL — ABNORMAL LOW (ref 3.87–5.11)
RDW: 14.8 % (ref 11.5–15.5)
WBC: 8.3 10*3/uL (ref 4.0–10.5)
nRBC: 0 % (ref 0.0–0.2)

## 2020-06-10 LAB — BPAM RBC
Blood Product Expiration Date: 202107202359
Blood Product Expiration Date: 202107232359
Unit Type and Rh: 8400
Unit Type and Rh: 8400

## 2020-06-10 LAB — PREPARE RBC (CROSSMATCH)

## 2020-06-10 MED ORDER — ENOXAPARIN SODIUM 30 MG/0.3ML ~~LOC~~ SOLN
30.0000 mg | SUBCUTANEOUS | Status: DC
  Administered 2020-06-10 – 2020-06-12 (×3): 30 mg via SUBCUTANEOUS
  Filled 2020-06-10 (×4): qty 0.3

## 2020-06-10 MED ORDER — MELATONIN 3 MG PO TABS
6.0000 mg | ORAL_TABLET | Freq: Every evening | ORAL | Status: DC | PRN
  Administered 2020-06-10: 6 mg via ORAL
  Filled 2020-06-10: qty 2

## 2020-06-10 MED ORDER — SODIUM CHLORIDE 0.9 % IV BOLUS
500.0000 mL | Freq: Once | INTRAVENOUS | Status: DC
  Administered 2020-06-10: 500 mL via INTRAVENOUS

## 2020-06-10 MED ORDER — SODIUM CHLORIDE 0.9% IV SOLUTION: Freq: Once | INTRAVENOUS | Status: AC

## 2020-06-10 MED ORDER — POLYETHYLENE GLYCOL 3350 17 G PO PACK
17.0000 g | PACK | Freq: Every day | ORAL | Status: AC
  Administered 2020-06-10: 17 g via ORAL
  Filled 2020-06-10 (×2): qty 1

## 2020-06-10 MED ORDER — POTASSIUM CHLORIDE CRYS ER 20 MEQ PO TBCR
20.0000 meq | EXTENDED_RELEASE_TABLET | Freq: Two times a day (BID) | ORAL | Status: DC
  Administered 2020-06-10 – 2020-06-12 (×6): 20 meq via ORAL
  Filled 2020-06-10 (×5): qty 1

## 2020-06-10 MED ORDER — SODIUM CHLORIDE 0.9 % IV SOLN: INTRAVENOUS | Status: AC

## 2020-06-10 NOTE — Progress Notes (Signed)
hgb- 6.7 unable to contact on call hospitalist through amnion which is down at this time

## 2020-06-10 NOTE — Plan of Care (Signed)
  Problem: Education: Goal: Verbalization of understanding the information provided (i.e., activity precautions, restrictions, etc) will improve Outcome: Progressing   Problem: Activity: Goal: Ability to ambulate and perform ADLs will improve Outcome: Progressing   Problem: Self-Concept: Goal: Ability to maintain and perform role responsibilities to the fullest extent possible will improve Outcome: Progressing   Problem: Pain Management: Goal: Pain level will decrease Outcome: Progressing   

## 2020-06-10 NOTE — Progress Notes (Addendum)
OT Cancellation Note  Patient Details Name: Amanda Salas MRN: [id number] DOB: Jul 04, 1927   Cancelled Treatment:    Reason Eval/Treat Not Completed: Medical issues which prohibited therapy (Hgb 6.7) OT will continue efforts toward completion of evaluation after transfusion.   Addendum 1300: RN continued deferral at this time as patient has just initiated transfusion. OT will continue efforts when patient is medically stable.   Gloris Manchester OTR/L Supplemental OT, Department of rehab services [phone]   Janari Yamada R H. 06/10/2020, 11:31 AM

## 2020-06-10 NOTE — Progress Notes (Signed)
Initial Nutrition Assessment  DOCUMENTATION CODES:   Not applicable  INTERVENTION:   -Magic cup BID with meals, each supplement provides 290 kcal and 9 grams of protein -MVI with minerals daily  NUTRITION DIAGNOSIS:   Increased nutrient needs related to post-op healing as evidenced by estimated needs.  GOAL:   Patient will meet greater than or equal to 90% of their needs  MONITOR:   PO intake, Supplement acceptance, Labs, Weight trends, Skin, I & O's  REASON FOR ASSESSMENT:   Consult Hip fracture protocol  ASSESSMENT:   Amanda Salas is a 86 y.o. female with medical history significant of HTN; HLD; dementia; and thrombocytopenia presenting with fall.  Her daughter requests a spinal, does not want her put to sleep  Sometime in the middle of the night, she went to get water and went to open a cabinet and she reported that her hip gave away.  Pt admitted with lt hip fracture.   6/20- s/p Open treatment of pertrochanteric,subtrochanteric fracture with intramedullary implant.  Reviewed I/O's: -700 ml x 24 hours   UOP: 700 ml x 24 hours  Spoke with pt and granddaughter in room. Both pleasant and in good spirits today. Granddaughter reports that pt is very active and independent- living in a downstairs apartment of her home. She has a great appetite and consumes 3 meals per day- pt and granddaughter shares responsibility in cooking meals. Pt shares that she is "fat and sassy", liking to eat, especially foods such as coleslaw and cake. Pt was consuming cake at time of visit.   Pt denies any weight loss. Reviewed wt hx; no record of wt loss noted.   Discussed importance of good meal intake to promote healing. 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   Labs reviewed.   NUTRITION - FOCUSED PHYSICAL EXAM:    Most Recent Value  Orbital Region Mild depletion   Upper Arm Region Mild depletion  Thoracic and Lumbar Region No depletion  Buccal Region No depletion  Temple Region No depletion  Clavicle Bone Region No depletion  Clavicle and Acromion Bone Region No depletion  Scapular Bone Region No depletion  Dorsal Hand No depletion  Patellar Region No depletion  Anterior Thigh Region No depletion  Posterior Calf Region No depletion  Edema (RD Assessment) Mild  Hair Reviewed  Eyes Reviewed  Mouth Reviewed  Skin Reviewed  Nails Reviewed       Diet Order:   Diet Order            Diet regular Room service appropriate? Yes; Fluid consistency: Thin  Diet effective now                 EDUCATION NEEDS:   Education needs have been addressed  Skin:  Skin Assessment: Skin Integrity Issues: Skin Integrity Issues:: Incisions Incisions: closed lt leg  Last BM:  Unknown  Height:   Ht Readings from Last 1 Encounters:  06/09/20 5\' 3"  (1.6 m)    Weight:   Wt Readings from Last 1 Encounters:  06/09/20 52.2 kg    Ideal Body Weight:  52.3 kg  BMI:  Body mass index is 20.37 kg/m.  Estimated Nutritional Needs:   Kcal:  1300-1500  Protein:  65-80 grams  Fluid:  > 1.3 L    Loistine Chance, RD, LDN, [place] Registered Dietitian II Certified Diabetes Care and Education  Specialist Please refer to [facility] for RD and/or RD on-call/weekend/after hours pager

## 2020-06-10 NOTE — Evaluation (Addendum)
Physical Therapy Evaluation Patient Details Name: Amanda Salas MRN: [id number] DOB: June 01, 1927 Today's Date: 06/10/2020   History of Present Illness  Patient is a 86 y/o female who presents with left hip fx s/p fall. Now s/p IM nail 06/09/20. PMH includes Rt THA, HTN, HLD, mild dementia, osteoporosis.  Clinical Impression  Patient presents with pain, post surgical deficits LLE, decreased activity tolerance and impaired mobility s/p above. Pt lives in her own handicapped apt attached to her granddaughter's home monitored by cameras. She is independent for ambulation and ADLs PTA. Today, pt requires Max A for bed mobility, Mod A for transfers and Min A for gait training with use of RW for support. Education on there ex. Per daughter, pt is not at cognitive baseline and appears more confused today. Family would really like to take pt home and maximize all [facility] services as well as get a sitter for night time if possible pending improvement. Would benefit from chatting with a case Freight forwarder. Will follow acutely to maximize independence and mobility prior to return home.    Follow Up Recommendations Home health PT;Supervision for mobility/OOB;Supervision/Assistance - 24 hour (pending improvement with sitter for night time)    Equipment Recommendations  None recommended by PT    Recommendations for Other Services       Precautions / Restrictions Precautions Precautions: Fall Restrictions Weight Bearing Restrictions: Yes LLE Weight Bearing: Weight bearing as tolerated      Mobility  Bed Mobility Overal bed mobility: Needs Assistance Bed Mobility: Supine to Sit     Supine to sit: Max assist;HOB elevated;+2 for physical assistance     General bed mobility comments: Assist with LLE, trunk and scooting bottom to EOB. Cues to reach for rails, reluctant to mobilize due to pain.  Transfers Overall transfer level: Needs assistance Equipment used: Rolling walker (2 wheeled) Transfers: Sit  to/from Stand Sit to Stand: Mod assist;From elevated surface         General transfer comment: Assist to power to standing with cues for hand placement. Transferred to chair post ambulation.  Ambulation/Gait Ambulation/Gait assistance: Min assist Gait Distance (Feet): 6 Feet Assistive device: Rolling walker (2 wheeled) Gait Pattern/deviations: Step-to pattern;Step-through pattern;Decreased stance time - left;Decreased step length - right;Trunk flexed Gait velocity: decreased Gait velocity interpretation: <1.31 ft/sec, indicative of household ambulator General Gait Details: Slow, unsteady gait with difficulty advancing RLE due to left knee buckling. Flexed trunk. Limited by pain/weakness.  Stairs            Wheelchair Mobility    Modified Rankin (Stroke Patients Only)       Balance Overall balance assessment: Needs assistance;History of Falls Sitting-balance support: Feet supported;No upper extremity supported Sitting balance-Leahy Scale: Good Sitting balance - Comments: supervision   Standing balance support: During functional activity Standing balance-Leahy Scale: Poor Standing balance comment: requires UE support and external support in standing.                             Pertinent Vitals/Pain Pain Assessment: Faces Faces Pain Scale: Hurts whole lot Pain Location: LLE with movement Pain Descriptors / Indicators: Sore;Operative site guarding;Grimacing;Guarding;Discomfort;Moaning Pain Intervention(s): Monitored during session;Repositioned;Limited activity within patient's tolerance    Home Living Family/patient expects to be discharged to:: Private residence Living Arrangements: Alone (apt monitored by camera) Available Help at Discharge: Family;Available 24 hours/day (granddaughter lives next door) Type of Home: Apartment Home Access: Level entry     Home Layout: One level  Home Equipment: [place] - 2 wheels;Shower seat;Bedside commode;Grab bars  - toilet;Grab bars - tub/shower Additional Comments: Lives in a handicap accessible apt attached to grandaughters home    Prior Function Level of Independence: Independent         Comments: independent with ambulation PTA. Stopped using RW.     Hand Dominance        Extremity/Trunk Assessment   Upper Extremity Assessment Upper Extremity Assessment: Defer to OT evaluation    Lower Extremity Assessment Lower Extremity Assessment: LLE deficits/detail LLE Deficits / Details: Able to perform quad set, post surgical deficits in AROM/strength. LLE Sensation: WNL    Cervical / Trunk Assessment Cervical / Trunk Assessment: Kyphotic  Communication   Communication: No difficulties  Cognition Arousal/Alertness: Awake/alert Behavior During Therapy: WFL for tasks assessed/performed Overall Cognitive Status: Impaired/Different from baseline Area of Impairment: Memory;Orientation;Problem solving                 Orientation Level: Disoriented to;Place;Time;Situation   Memory: Decreased short-term memory       Problem Solving: Difficulty sequencing;Requires verbal cues;Requires tactile cues;Slow processing General Comments: Per daughter, pt does not seem at cognitive baseline, impaired STM and needs reminders of situation and surgery. Oriented to self only today. "at the bank"      General Comments General comments (skin integrity, edema, etc.): daughter present during session and provided PLOF/history.    Exercises General Exercises - Lower Extremity Ankle Circles/Pumps: AROM;Both;10 reps;Supine Quad Sets: AROM;Both;5 reps;Supine   Assessment/Plan    PT Assessment Patient needs continued PT services  PT Problem List Decreased strength;Decreased mobility;Decreased safety awareness;Decreased range of motion;Decreased activity tolerance;Decreased cognition;Decreased skin integrity;Pain;Decreased balance       PT Treatment Interventions Therapeutic activities;Gait  training;Therapeutic exercise;Patient/family education;Balance training;Functional mobility training    PT Goals (Current goals can be found in the Care Plan section)  Acute Rehab PT Goals Patient Stated Goal: to get home PT Goal Formulation: With patient/family Time For Goal Achievement: 06/24/20 Potential to Achieve Goals: Good    Frequency Min 3X/week   Barriers to discharge        Co-evaluation               AM-PAC PT "6 Clicks" Mobility  Outcome Measure Help needed turning from your back to your side while in a flat bed without using bedrails?: A Little Help needed moving from lying on your back to sitting on the side of a flat bed without using bedrails?: Total Help needed moving to and from a bed to a chair (including a wheelchair)?: A Lot Help needed standing up from a chair using your arms (e.g., wheelchair or bedside chair)?: A Lot Help needed to walk in hospital room?: A Little Help needed climbing 3-5 steps with a railing? : Total 6 Click Score: 12    End of Session Equipment Utilized During Treatment: Gait belt Activity Tolerance: Patient limited by pain;Patient tolerated treatment well Patient left: in chair;with call bell/phone within reach;with family/visitor present Nurse Communication: Mobility status PT Visit Diagnosis: Pain;Difficulty in walking, not elsewhere classified (R26.2);Unsteadiness on feet (R26.81);Muscle weakness (generalized) (M62.81) Pain - Right/Left: Left Pain - part of body: Leg    Time: 9937-1696 PT Time Calculation (min) (ACUTE ONLY): 40 min   Charges:   PT Evaluation $PT Eval Moderate Complexity: 1 Mod PT Treatments $Gait Training: 8-22 mins $Therapeutic Activity: 8-22 mins        Marisa Severin, PT, DPT Acute Rehabilitation Services Pager [phone] Office [phone]  Marguarite Arbour A Kendalynn Wideman 06/10/2020, 1:00 PM

## 2020-06-10 NOTE — Progress Notes (Signed)
   Subjective:  Patient reports pain as mild.   Eating breakfast.  Objective:   VITALS:   Vitals:   06/09/20 1910 06/09/20 2339 06/10/20 0347 06/10/20 0809  BP: (!) 115/53 (!) 125/58 (!) 115/53 (!) 126/51  Pulse: 63 70 78 75  Resp: 16 15 16 16   Temp: 98.3 F (36.8 C) 98.2 F (36.8 C) 98.7 F (37.1 C) 98.6 F (37 C)  TempSrc: Oral Oral Oral Oral  SpO2: 93% 94% 92% 95%  Weight:      Height:        Sensation intact distally Intact pulses distally Dorsiflexion/Plantar flexion intact Incision: dressing C/D/I and no drainage   Lab Results  Component Value Date   WBC 8.3 06/10/2020   HGB 6.7 (LL) 06/10/2020   HCT 21.4 (L) 06/10/2020   MCV 87.3 06/10/2020   PLT 85 (L) 06/10/2020     Assessment/Plan:  1 Day Post-Op   - Expected postop acute blood loss anemia - will monitor for symptoms - agree with transfusion of RBCs - Up with PT/OT - DVT ppx - SCDs, ambulation, lovenox - WBAT operative extremity - Pain control  Eduard Roux 06/10/2020, 9:38 AM [phone]

## 2020-06-10 NOTE — Progress Notes (Signed)
[facility] PROGRESS NOTE    Progress Note  Amanda Salas  LKG:[id number] DOB: August 10, 1927 DOA: 06/09/2020 PCP: Abner Greenspan, MD     Brief Narrative:   Amanda Salas is an 86 y.o. female past medical history of essential hypertension hyperlipidemia dementia chronic thrombocytopenia comes in after a fall  Assessment/Plan:   Closed comminuted intertrochanteric fracture of left femur ([place]): Due to a mechanical fall history of right hip fracture in 2019 status post repair no prior injury. Orthopedic surgery was consulted and recommended surgical intervention on 06/09/2020 displaced left peritrochanteric, subtrochanteric intramedullary implant. Continue Robaxin and narcotics for breakthrough pain. Physical therapy evaluation is pending, TOC has been consulted for skilled nursing facility placement. Anticoagulation per Ortho. Started on MiraLAX p.o. daily for 3 days.  Advanced dementia without behavioral disturbances: Continue Aricept.  Essential hypertension: Continue metoprolol, hold Cozaar due to acute renal failure.  Hyperlipidemia: Continue continue statins.  Acute kidney injury on stage IIIa chronic kidney disease: With a baseline creatinine of less than 1, likely prerenal azotemia. On no nephrotoxic drugs she has received no contrast likely prerenal azotemia we will give a bolus of normal saline hold antihypertensive medication continue normal saline infusion for 24 hours and recheck a basic metabolic panel in the morning  Hyperglycemia: Likely stress response continue sliding scale insulin.  Normocytic anemia/acute blood loss anemia: Likely due to surgery. We will transfuse 2 units of packed red blood cells.  Check a CBC posttransfusion on  DVT prophylaxis: lovenox Family Communication:daughter Status is: Inpatient  Remains inpatient appropriate because:Hemodynamically unstable   Dispo: The patient is from: Home              Anticipated d/c is to:  Home              Anticipated d/c date is: 2 days              Patient currently is not medically stable to d/c.        Code Status:     Code Status Orders  (From admission, onward)         Start     Ordered   06/09/20 0824  Do not attempt resuscitation (DNR)  Continuous       Question Answer Comment  In the event of cardiac or respiratory ARREST Do not call a "code blue"   In the event of cardiac or respiratory ARREST Do not perform Intubation, CPR, defibrillation or ACLS   In the event of cardiac or respiratory ARREST Use medication by any route, position, wound care, and other measures to relive pain and suffering. May use oxygen, suction and manual treatment of airway obstruction as needed for comfort.      06/09/20 0824        Code Status History    Date Active Date Inactive Code Status Order ID Comments User Context   10/30/2017 1832 11/04/2017 1528 Full Code [id number]  Amanda Hamman, MD ED   Advance Care Planning Activity    Advance Directive Documentation     Most Recent Value  Type of Advance Directive Out of facility DNR (pink MOST or yellow form), Healthcare Power of Attorney, Living will  Pre-existing out of facility DNR order (yellow form or pink MOST form) --  "MOST" Form in Place? --        IV Access:    Peripheral IV   Procedures and diagnostic studies:   DG Chest 1 View  Result Date: 06/09/2020 CLINICAL DATA:  Hip fracture EXAM: CHEST  1 VIEW COMPARISON:  None. FINDINGS: Normal mediastinum and cardiac silhouette. Normal pulmonary vasculature. No evidence of effusion, infiltrate, or pneumothorax. No acute bony abnormality. IMPRESSION: No acute cardiopulmonary process. Electronically Signed   By: Suzy Bouchard M.D.   On: 06/09/2020 07:42   CT HEAD WO CONTRAST  Result Date: 06/09/2020 CLINICAL DATA:  LEFT hip fracture.  Fall. EXAM: CT HEAD WITHOUT CONTRAST TECHNIQUE: Contiguous axial images were obtained from the base of the skull through  the vertex without intravenous contrast. COMPARISON:  None. FINDINGS: Brain: No acute intracranial hemorrhage. No focal mass lesion. No CT evidence of acute infarction. No midline shift or mass effect. No hydrocephalus. Basilar cisterns are patent. There are periventricular and subcortical white matter hypodensities. Generalized cortical atrophy. Vascular: No hyperdense vessel or unexpected calcification. Skull: Normal. Negative for fracture or focal lesion. Sinuses/Orbits: Paranasal sinuses and mastoid air cells are clear. Orbits are clear. Other: None. IMPRESSION: 1. No acute intracranial findings. 2. Atrophy and white matter microvascular disease. Electronically Signed   By: Suzy Bouchard M.D.   On: 06/09/2020 07:57   DG C-Arm 1-60 Min  Result Date: 06/09/2020 CLINICAL DATA:  Left femoral intramedullary nail. EXAM: LEFT FEMUR 2 VIEWS; DG C-ARM 1-60 MIN COMPARISON:  Earlier 06/09/2020 FINDINGS: Examination demonstrates fixation of patient's left hip fracture with left femoral intramedullary nail bridging patient's subtrochanteric fracture and associated screws extending through the femoral head into the femoral neck. Hardware is intact with near anatomic alignment over the fracture site. IMPRESSION: Near anatomic alignment of patient's left proximal femur fracture post fixation with hardware intact. Electronically Signed   By: Marin Olp M.D.   On: 06/09/2020 14:44   DG Hip Unilat With Pelvis 2-3 Views Left  Result Date: 06/09/2020 CLINICAL DATA:  Fall at home. EXAM: DG HIP (WITH OR WITHOUT PELVIS) 2-3V LEFT COMPARISON:  None. FINDINGS: Intertrochanteric fracture of the LEFT femur with varus angulation. RIGHT hip arthroplasty noted. Vascular calcifications IMPRESSION: LEFT intertrochanteric femur fracture. Electronically Signed   By: Suzy Bouchard M.D.   On: 06/09/2020 07:41   DG FEMUR MIN 2 VIEWS LEFT  Result Date: 06/09/2020 CLINICAL DATA:  Left femoral intramedullary nail. EXAM: LEFT FEMUR  2 VIEWS; DG C-ARM 1-60 MIN COMPARISON:  Earlier 06/09/2020 FINDINGS: Examination demonstrates fixation of patient's left hip fracture with left femoral intramedullary nail bridging patient's subtrochanteric fracture and associated screws extending through the femoral head into the femoral neck. Hardware is intact with near anatomic alignment over the fracture site. IMPRESSION: Near anatomic alignment of patient's left proximal femur fracture post fixation with hardware intact. Electronically Signed   By: Marin Olp M.D.   On: 06/09/2020 14:44     Medical Consultants:    None.  Anti-Infectives:   cefazolin  Subjective:    Tyler Aas she relates her pain is control has not had a bowel movement.  Objective:    Vitals:   06/09/20 1910 06/09/20 2339 06/10/20 0347 06/10/20 0809  BP: (!) 115/53 (!) 125/58 (!) 115/53 (!) 126/51  Pulse: 63 70 78 75  Resp: 16 15 16 16   Temp: 98.3 F (36.8 C) 98.2 F (36.8 C) 98.7 F (37.1 C) 98.6 F (37 C)  TempSrc: Oral Oral Oral Oral  SpO2: 93% 94% 92% 95%  Weight:      Height:       SpO2: 95 % O2 Flow Rate (L/min): 2 L/min   Intake/Output Summary (Last 24 hours) at 06/10/2020 4008 Last data filed at 06/10/2020  0300 Gross per 24 hour  Intake 300 ml  Output 1000 ml  Net -700 ml   Filed Weights   06/09/20 0630  Weight: 52.2 kg    Exam: General exam: In no acute distress. Respiratory system: Good air movement and clear to auscultation. Cardiovascular system: S1 & S2 heard, RRR. No JVD, murmurs, rubs, gallops or clicks.  Gastrointestinal system: Abdomen is nondistended, soft and nontender.  Central nervous system: Alert and oriented. No focal neurological deficits. Extremities: No pedal edema. Skin: No rashes, lesions or ulcers Psychiatry: Judgement and insight appear normal. Mood & affect appropriate.    Data Reviewed:    Labs: Basic Metabolic Panel: Recent Labs  Lab 06/09/20 0624 06/10/20 0424  NA 137 135  K 3.4*  3.5  CL 103 103  CO2 22 23  GLUCOSE 137* 161*  BUN 15 19  CREATININE 0.98 1.40*  CALCIUM 9.0 8.0*   GFR Estimated Creatinine Clearance: 22 mL/min (A) (by C-G formula based on SCr of 1.4 mg/dL (H)). Liver Function Tests: No results for input(s): AST, ALT, ALKPHOS, BILITOT, PROT, ALBUMIN in the last 168 hours. No results for input(s): LIPASE, AMYLASE in the last 168 hours. No results for input(s): AMMONIA in the last 168 hours. Coagulation profile Recent Labs  Lab 06/09/20 0624  INR 1.0   COVID-19 Labs  No results for input(s): DDIMER, FERRITIN, LDH, CRP in the last 72 hours.  Lab Results  Component Value Date   [place] NEGATIVE 06/09/2020    CBC: Recent Labs  Lab 06/09/20 0624 06/10/20 0424  WBC 10.4 8.3  NEUTROABS 8.5*  --   HGB 11.0* 6.7*  HCT 34.9* 21.4*  MCV 89.0 87.3  PLT 89* 85*   Cardiac Enzymes: No results for input(s): CKTOTAL, CKMB, CKMBINDEX, TROPONINI in the last 168 hours. BNP (last 3 results) No results for input(s): PROBNP in the last 8760 hours. CBG: No results for input(s): GLUCAP in the last 168 hours. D-Dimer: No results for input(s): DDIMER in the last 72 hours. Hgb A1c: No results for input(s): HGBA1C in the last 72 hours. Lipid Profile: No results for input(s): CHOL, HDL, LDLCALC, TRIG, CHOLHDL, LDLDIRECT in the last 72 hours. Thyroid function studies: No results for input(s): TSH, T4TOTAL, T3FREE, THYROIDAB in the last 72 hours.  Invalid input(s): FREET3 Anemia work up: No results for input(s): VITAMINB12, FOLATE, FERRITIN, TIBC, IRON, RETICCTPCT in the last 72 hours. Sepsis Labs: Recent Labs  Lab 06/09/20 0624 06/10/20 0424  WBC 10.4 8.3   Microbiology Recent Results (from the past 240 hour(s))  SARS Coronavirus 2 by RT PCR (hospital order, performed in [facility] hospital lab) Nasopharyngeal Nasopharyngeal Swab     Status: None   Collection Time: 06/09/20  6:39 AM   Specimen: Nasopharyngeal Swab  Result Value Ref  Range Status   SARS Coronavirus 2 NEGATIVE NEGATIVE Final    Comment: (NOTE) SARS-CoV-2 target nucleic acids are NOT DETECTED.  The SARS-CoV-2 RNA is generally detectable in upper and lower respiratory specimens during the acute phase of infection. The lowest concentration of SARS-CoV-2 viral copies this assay can detect is 250 copies / mL. A negative result does not preclude SARS-CoV-2 infection and should not be used as the sole basis for treatment or other patient management decisions.  A negative result may occur with improper specimen collection / handling, submission of specimen other than nasopharyngeal swab, presence of viral mutation(s) within the areas targeted by this assay, and inadequate number of viral copies (<250 copies / mL). A  negative result must be combined with clinical observations, patient history, and epidemiological information.  Fact Sheet for Patients:   StrictlyIdeas.no  Fact Sheet for Healthcare Providers: BankingDealers.co.za  This test is not yet approved or  cleared by the [place] FDA and has been authorized for detection and/or diagnosis of SARS-CoV-2 by FDA under an Emergency Use Authorization (EUA).  This EUA will remain in effect (meaning this test can be used) for the duration of the COVID-19 declaration under Section 564(b)(1) of the Act, 21 U.S.C. section 360bbb-3(b)(1), unless the authorization is terminated or revoked sooner.  Performed at [facility], [place] [address] [place], [place] [address]   Surgical pcr screen     Status: None   Collection Time: 06/09/20 10:42 AM   Specimen: Nasal Mucosa; Nasal Swab  Result Value Ref Range Status   MRSA, PCR NEGATIVE NEGATIVE Final   Staphylococcus aureus NEGATIVE NEGATIVE Final    Comment: (NOTE) The Xpert SA Assay (FDA approved for NASAL specimens in patients 102 years of age and older), is one component of a comprehensive surveillance  program. It is not intended to diagnose infection nor to guide or monitor treatment. Performed at [facility], [place] [address], [place], [place] [address]      Medications:   . acetaminophen  500 mg Oral Q6H  . docusate sodium  100 mg Oral BID  . donepezil  10 mg Oral QHS  . enoxaparin (LOVENOX) injection  40 mg Subcutaneous Q24H  . losartan  50 mg Oral Daily  . metoprolol succinate  50 mg Oral Daily  . simvastatin  20 mg Oral QHS   Continuous Infusions: . sodium chloride 75 mL/hr at 06/09/20 1740  . methocarbamol (ROBAXIN) IV        LOS: 1 day   Charlynne Cousins  [facility]  06/10/2020, 8:23 AM

## 2020-06-11 LAB — BPAM PLATELET PHERESIS
Blood Product Expiration Date: 202106212359
Blood Product Expiration Date: 202106212359
ISSUE DATE / TIME: 202106211121
ISSUE DATE / TIME: 202106211243
Unit Type and Rh: 6200
Unit Type and Rh: 7300

## 2020-06-11 LAB — TYPE AND SCREEN
ABO/RH(D): AB POS
Antibody Screen: NEGATIVE
Unit division: 0
Unit division: 0

## 2020-06-11 LAB — CBC
HCT: 31.9 % — ABNORMAL LOW (ref 36.0–46.0)
Hemoglobin: 10.5 g/dL — ABNORMAL LOW (ref 12.0–15.0)
MCH: 28.8 pg (ref 26.0–34.0)
MCHC: 32.9 g/dL (ref 30.0–36.0)
MCV: 87.4 fL (ref 80.0–100.0)
Platelets: 82 10*3/uL — ABNORMAL LOW (ref 150–400)
RBC: 3.65 MIL/uL — ABNORMAL LOW (ref 3.87–5.11)
RDW: 14.5 % (ref 11.5–15.5)
WBC: 9.8 10*3/uL (ref 4.0–10.5)
nRBC: 0 % (ref 0.0–0.2)

## 2020-06-11 LAB — BASIC METABOLIC PANEL
Anion gap: 8 (ref 5–15)
BUN: 18 mg/dL (ref 8–23)
CO2: 20 mmol/L — ABNORMAL LOW (ref 22–32)
Calcium: 8.3 mg/dL — ABNORMAL LOW (ref 8.9–10.3)
Chloride: 106 mmol/L (ref 98–111)
Creatinine, Ser: 1.04 mg/dL — ABNORMAL HIGH (ref 0.44–1.00)
GFR calc Af Amer: 55 mL/min — ABNORMAL LOW (ref >60)
GFR calc non Af Amer: 47 mL/min — ABNORMAL LOW (ref >60)
Glucose, Bld: 116 mg/dL — ABNORMAL HIGH (ref 70–99)
Potassium: 4.1 mmol/L (ref 3.5–5.1)
Sodium: 134 mmol/L — ABNORMAL LOW (ref 135–145)

## 2020-06-11 LAB — PREPARE PLATELET PHERESIS
Unit division: 0
Unit division: 0

## 2020-06-11 LAB — BPAM RBC
Blood Product Expiration Date: 202107202359
Blood Product Expiration Date: 202107232359
ISSUE DATE / TIME: 202106211442
ISSUE DATE / TIME: 202106211749
Unit Type and Rh: 8400
Unit Type and Rh: 8400

## 2020-06-11 MED ORDER — ACETAMINOPHEN 325 MG PO TABS
650.0000 mg | ORAL_TABLET | Freq: Four times a day (QID) | ORAL | Status: DC | PRN
  Administered 2020-06-12: 650 mg via ORAL
  Filled 2020-06-11 (×2): qty 2

## 2020-06-11 MED ORDER — LOSARTAN POTASSIUM 50 MG PO TABS
50.0000 mg | ORAL_TABLET | Freq: Every day | ORAL | Status: DC
  Administered 2020-06-11 – 2020-06-12 (×2): 50 mg via ORAL
  Filled 2020-06-11 (×2): qty 1

## 2020-06-11 MED ORDER — MELATONIN 3 MG PO TABS
3.0000 mg | ORAL_TABLET | Freq: Every evening | ORAL | Status: DC | PRN
  Administered 2020-06-12: 3 mg via ORAL
  Filled 2020-06-11 (×2): qty 1

## 2020-06-11 NOTE — TOC Progression Note (Addendum)
Transition of Care [facility]) - Progression Note    Patient Details  Name: Amanda Salas MRN: [id number] Date of Birth: October 08, 1927  [facility] CM/SW Contact  Sharin Mons, RN Phone Number: 06/11/2020, 2:26 PM  Clinical Narrative:     NCM  received call from pt's daughter [name]. [name] stated she has changed her mind and now would like SNF placement for mom. Stated SNF preference, [facility]NCM discussed insurance authorization process and provided Medicare SNF ratings list.Daughter  expressed being hopeful for rehab and mom to feel better soon. No further questions reported at this time. NCM to continue to follow and assist with discharge planning needs. Referrals for SNF bed offers faxed in [place] ...  Expected Discharge Plan: Skilled Nursing Facility Barriers to Discharge: Continued Medical Work up  Expected Discharge Plan and Services Expected Discharge Plan: [facility]   Discharge Planning Services: CM Consult     Social Determinants of Health (SDOH) Interventions    Readmission Risk Interventions No flowsheet data found.

## 2020-06-11 NOTE — TOC Initial Note (Signed)
Transition of Care [facility]) - Initial/Assessment Note    Patient Details  Name: Amanda Salas MRN: [id number] Date of Birth: Jan 16, 1927  Transition of Care [facility]) CM/SW Contact:    Sharin Mons, RN Phone Number: [phone] 06/11/2020, 11:55 AM  Clinical Narrative:     Presents s/p fall, suffered L hip fx. From home alone. Resides in apartment of granddaughter's home.     -s/p IM nail 06/09/20            NCM shared PT evaluation/ recommendation with daughter  Luciana Axe, Supervision for mobility/OOB;Supervision/Assistance - 24 hour;SNF. Daughter states no SNF placement for mom. Agreeable to [facility] services. States has used [facility] in the past and would like to use them again. Referral made with [facility] and accepted pending MD's orders.   Expected Discharge Plan: [place] Barriers to Discharge: Continued Medical Work up   Patient Goals and CMS Choice     Choice offered to / list presented to : Adult Children [name] ([phone])  Expected Discharge Plan and Services Expected Discharge Plan: [place]   Discharge Planning Services: CM Consult                               HH Arranged: PT, OT, Nurse's Aide [place] Agency: [facility] Date [place]: 06/11/20 Time [place]: 58 Representative spoke with at [place]: [place] Arrangements/Services       Do you feel safe going back to the place where you live?: Yes               Activities of Daily Living Home Assistive Devices/Equipment: Walker (specify type), Grab bars in shower ADL Screening (condition at time of admission) Patient's cognitive ability adequate to safely complete daily activities?: No Is the patient deaf or have difficulty hearing?: No Does the patient have difficulty seeing, even when wearing glasses/contacts?: No Does the patient have difficulty concentrating, remembering, or  making decisions?: Yes Patient able to express need for assistance with ADLs?: Yes Does the patient have difficulty dressing or bathing?: No Independently performs ADLs?: Yes (appropriate for developmental age) Does the patient have difficulty walking or climbing stairs?: Yes Weakness of Legs: Both Weakness of Arms/Hands: None  Permission Sought/Granted                  Emotional Assessment              Admission diagnosis:  Hip fracture ([place]) [S72.009A] Closed fracture of left hip, initial encounter [facility]) [S72.002A] Patient Active Problem List   Diagnosis Date Noted  . Closed comminuted intertrochanteric fracture of left femur ([place]) 06/09/2020  . Dementia without behavioral disturbance ([place]) 06/09/2020  . Stage 3a chronic kidney disease 06/09/2020  . Hyperglycemia 06/09/2020  . Medicare annual wellness visit, subsequent 08/30/2018  . Routine general medical examination at a health care facility 08/30/2018  . Advance care planning 08/30/2018  . H/O fracture of hip 10/30/2017  . Dystrophic nail 04/23/2015  . Encounter for Medicare annual wellness exam 04/17/2014  . Colon cancer screening 04/17/2014  . Anemia 04/17/2014  . History of vertebral compression fracture 12/26/2013  . Low back pain 11/22/2013  . 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 MCI (mild cognitive impairment) 12/29/2010  . Vitamin D deficiency 07/01/2010  . FALL, HX OF 07/01/2010  . Hyperlipidemia 08/10/2008  . Essential hypertension 08/10/2008  .  ALLERGIC RHINITIS 08/10/2008  . GERD 08/10/2008  . Osteoporosis 08/10/2008   PCP:  Abner Greenspan, MD Pharmacy:   [place], [address] [place] [address] Phone: [phone] Fax: [place], [place], [place] [address], [place] [address] Phone: [phone] Fax: [phone]     Social Determinants of Health (SDOH)  Interventions    Readmission Risk Interventions No flowsheet data found.

## 2020-06-11 NOTE — Evaluation (Signed)
Occupational Therapy Evaluation Patient Details Name: Amanda Salas MRN: [id number] DOB: [birth year] Today's Date: 06/11/2020    History of Present Illness Patient is a 86 y/o female who presents with left hip fx s/p fall. Now s/p IM nail 06/09/20. PMH includes Rt THA, HTN, HLD, mild dementia, osteoporosis.   Clinical Impression   Patient presents with deficits in standing balance, activity tolerance, functional mobility and is limited by decreased cognition, pain in LLE 2/2 fx., and increased need for assistance. Patient currently functioning below baseline requiring Mod-Max A grossly for LB BADLs, transfers, and mobility with use of RW. Patient's daughter present at bedside with discussion about post-acute rehab. Family would like for patient to d/c home as they can provide initial 24 hour supervision/assistance. Long-term, family is looking for assistance/supervision at night. Daughter understands patient's current level of function and need for assistance with all BADLs. Recommendation for SNF rehab vs. HHOT pending patient/family education and progress with acute OT.     Follow Up Recommendations  SNF;Home health OT;Supervision/Assistance - 24 hour    Equipment Recommendations   (Will continue to assess)    Recommendations for Other Services       Precautions / Restrictions Precautions Precautions: Fall Restrictions Weight Bearing Restrictions: Yes LLE Weight Bearing: Weight bearing as tolerated      Mobility Bed Mobility Overal bed mobility: Needs Assistance Bed Mobility: Supine to Sit     Supine to sit: HOB elevated;Max assist     General bed mobility comments: Patient seated in recliner upon entry  Transfers Overall transfer level: Needs assistance Equipment used: Rolling walker (2 wheeled) Transfers: Sit to/from [facility] Sit to Stand: Mod assist;Max assist Stand pivot transfers: Mod assist;+2 safety/equipment       General transfer  comment: Mod A for sit stand from elevated surface. Max A from recliner.     Balance Overall balance assessment: Needs assistance;History of Falls Sitting-balance support: Feet supported;No upper extremity supported Sitting balance-Leahy Scale: Fair Sitting balance - Comments: supervision, posterior lean Postural control: Posterior lean Standing balance support: During functional activity Standing balance-Leahy Scale: Poor Standing balance comment: requires UE support and external support in standing.                           ADL either performed or assessed with clinical judgement   ADL Overall ADL's : Needs assistance/impaired Eating/Feeding: Set up   Grooming: Set up;Sitting;Wash/dry hands;Wash/dry face           Upper Body Dressing : Moderate assistance   Lower Body Dressing: Maximal assistance   Toilet Transfer: Maximal assistance Toilet Transfer Details (indicate cue type and reason): Per clinical judgement and functional assessment Toileting- Clothing Manipulation and Hygiene: Maximal assistance               Vision   Vision Assessment?: No apparent visual deficits     Perception     Praxis      Pertinent Vitals/Pain Pain Assessment: 0-10 Pain Score: 8  Faces Pain Scale: Hurts whole lot Pain Location: LLE with movement Pain Descriptors / Indicators: Sore;Operative site guarding;Grimacing;Guarding;Discomfort Pain Intervention(s): Limited activity within patient's tolerance;Monitored during session     Hand Dominance     Extremity/Trunk Assessment Upper Extremity Assessment Upper Extremity Assessment: Generalized weakness   Lower Extremity Assessment Lower Extremity Assessment: Defer to PT evaluation   Cervical / Trunk Assessment Cervical / Trunk Assessment: Kyphotic   Communication Communication Communication: No difficulties   Cognition  Arousal/Alertness: Awake/alert Behavior During Therapy: WFL for tasks  assessed/performed Overall Cognitive Status: Impaired/Different from baseline Area of Impairment: Memory;Problem solving                 Orientation Level: Disoriented to;Place;Time;Situation   Memory: Decreased short-term memory       Problem Solving: Difficulty sequencing;Requires verbal cues;Requires tactile cues;Slow processing General Comments: cognition improved today, pt AxOx4   General Comments  Dt present at bedside    Exercises Exercises: General Lower Extremity General Exercises - Lower Extremity Ankle Circles/Pumps: AROM;Both;10 reps;Supine;Seated Quad Sets: AROM;Both;10 reps;Seated Long Arc Quad: AROM;Left;5 reps;Seated   Shoulder Instructions      Home Living Family/patient expects to be discharged to:: Private residence Living Arrangements: Alone (Ap) Available Help at Discharge: Family;Available PRN/intermittently Type of Home: Apartment Home Access: Level entry     Home Layout: One level     Bathroom Shower/Tub: Occupational psychologist: Handicapped height Bathroom Accessibility: Yes   Home Equipment: Environmental consultant - 2 wheels;Shower seat;Bedside commode;Grab bars - toilet;Grab bars - tub/shower   Additional Comments: Lives in a handicap accessible apt attached to grandaughters home      Prior Functioning/Environment Level of Independence: Needs assistance  Gait / Transfers Assistance Needed: Independent w/o AD for ambulation. Supervision A for shower transfers ADL's / Homemaking Assistance Needed: Supervision A for BADLs. Patient was sponge bathing.            OT Problem List: Decreased strength;Decreased range of motion;Decreased activity tolerance;Impaired balance (sitting and/or standing);Decreased coordination;Decreased cognition;Decreased safety awareness;Pain      OT Treatment/Interventions: Self-care/ADL training;Energy conservation;DME and/or AE instruction;Therapeutic activities;Patient/family education;Balance training     OT Goals(Current goals can be found in the care plan section) Acute Rehab OT Goals Patient Stated Goal: to get home OT Goal Formulation: With patient/family Time For Goal Achievement: 06/25/20 Potential to Achieve Goals: Good ADL Goals Pt Will Perform Lower Body Dressing: with min assist;with adaptive equipment;sit to/from stand Pt Will Transfer to Toilet: with min assist;ambulating;bedside commode Pt Will Perform Toileting - Clothing Manipulation and hygiene: with min assist;sit to/from stand  OT Frequency: Min 2X/week   Barriers to D/C:            Co-evaluation              AM-PAC OT "6 Clicks" Daily Activity     Outcome Measure Help from another person eating meals?: A Little Help from another person taking care of personal grooming?: A Little Help from another person toileting, which includes using toliet, bedpan, or urinal?: A Lot Help from another person bathing (including washing, rinsing, drying)?: A Lot Help from another person to put on and taking off regular upper body clothing?: A Little Help from another person to put on and taking off regular lower body clothing?: A Lot 6 Click Score: 15   End of Session Equipment Utilized During Treatment: Gait belt;Rolling walker  Activity Tolerance: Patient limited by lethargy;Patient limited by pain Patient left: in chair;with call bell/phone within reach;with chair alarm set;with family/visitor present  OT Visit Diagnosis: Unsteadiness on feet (R26.81);Muscle weakness (generalized) (M62.81);History of falling (Z91.81);Pain Pain - Right/Left: Left Pain - part of body: Leg;Hip                Time: 6553-7482 OT Time Calculation (min): 18 min Charges:  OT General Charges $OT Visit: 1 Visit OT Evaluation $OT Eval Moderate Complexity: 1 Mod  Tin Engram H. OTR/L Supplemental OT, Department of rehab services [phone]  [facility] R  H. 06/11/2020, 12:28 PM

## 2020-06-11 NOTE — NC FL2 (Signed)
[place] LEVEL OF CARE SCREENING TOOL     IDENTIFICATION  Patient Name: Amanda Salas Birthdate: 01-05-1927 Sex: female Admission Date (Current Location): 06/09/2020  [facility] and [place] Number:  Herbalist and Address:  [facility] [employer], [place] [address], [place], [place] [address]      Provider Number: [id number]  Attending Physician Name and Address:  Aileen Fass, Tammi Klippel, MD  Relative Name and Phone Number:       Current Level of Care: Hospital Recommended Level of Care: [place] Prior Approval Number:    Date Approved/Denied:   PASRR Number: [id number]  Discharge Plan: Home    Current Diagnoses: Patient Active Problem List   Diagnosis Date Noted   Closed comminuted intertrochanteric fracture of left femur ([place]) 06/09/2020   Dementia without behavioral disturbance ([place]) 06/09/2020   Stage 3a chronic kidney disease 06/09/2020   Hyperglycemia 06/09/2020   Medicare annual wellness visit, subsequent 08/30/2018   Routine general medical examination at a health care facility 08/30/2018   Advance care planning 08/30/2018   H/O fracture of hip 10/30/2017   Dystrophic nail 04/23/2015   Encounter for Medicare annual wellness exam 04/17/2014   Colon cancer screening 04/17/2014   Anemia 04/17/2014   History of vertebral compression fracture 12/26/2013   Low back pain 11/22/2013   Mycotic toenails 11/04/2011   MCI (mild cognitive impairment) 12/29/2010   Vitamin D deficiency 07/01/2010   FALL, HX OF 07/01/2010   Hyperlipidemia 08/10/2008   Essential hypertension 08/10/2008   ALLERGIC RHINITIS 08/10/2008   GERD 08/10/2008   Osteoporosis 08/10/2008    Orientation RESPIRATION BLADDER Height & Weight     Self  Normal Continent Weight: 52.2 kg Height:  5\' 3"  (160 cm)  BEHAVIORAL SYMPTOMS/MOOD NEUROLOGICAL BOWEL NUTRITION STATUS      Continent Diet (refer to d/c summary)  AMBULATORY  STATUS COMMUNICATION OF NEEDS Skin   Extensive Assist Verbally Surgical wounds (s/p IM nailing L hip, 6/20)                       Personal Care Assistance Level of Assistance  Bathing, Dressing, Feeding Bathing Assistance: Maximum assistance Feeding assistance: Independent Dressing Assistance: Maximum assistance     Functional Limitations Info  Sight, Hearing, Speech Sight Info: Adequate Hearing Info: Adequate Speech Info: Adequate    SPECIAL CARE FACTORS FREQUENCY  PT (By licensed PT), OT (By licensed OT)     PT Frequency: 5 x / week , evaluate and treat OT Frequency: 5 x / week , evaluate and treat            Contractures Contractures Info: Not present    Additional Factors Info  Code Status, Allergies, Insulin Sliding Scale Code Status Info: DNR Allergies Info: Ace Inhibitors, Alendronate Sodium           Current Medications (06/11/2020):  This is the current hospital active medication list Current Facility-Administered Medications  Medication Dose Route Frequency Provider Last Rate Last Admin   acetaminophen (TYLENOL) tablet 650 mg  650 mg Oral Q6H PRN Charlynne Cousins, MD       alum & mag hydroxide-simeth (MAALOX/MYLANTA) 200-200-20 MG/5ML suspension 30 mL  30 mL Oral Q4H PRN Leandrew Koyanagi, MD       docusate sodium (COLACE) capsule 100 mg  100 mg Oral BID Leandrew Koyanagi, MD   100 mg at 06/11/20 1036   donepezil (ARICEPT) tablet 10 mg  10 mg  Oral QHS Leandrew Koyanagi, MD   10 mg at 06/10/20 2202   enoxaparin (LOVENOX) injection 30 mg  30 mg Subcutaneous Q24H Charlynne Cousins, MD   30 mg at 06/10/20 1430   HYDROcodone-acetaminophen (NORCO) 7.5-325 MG per tablet 1-2 tablet  1-2 tablet Oral Q4H PRN Leandrew Koyanagi, MD       losartan (COZAAR) tablet 50 mg  50 mg Oral Daily Charlynne Cousins, MD   50 mg at 06/11/20 1044   magnesium citrate solution 1 Bottle  1 Bottle Oral Once PRN Leandrew Koyanagi, MD       melatonin tablet 3 mg  3 mg Oral QHS PRN Charlynne Cousins, MD       menthol-cetylpyridinium (CEPACOL) lozenge 3 mg  1 lozenge Oral PRN Leandrew Koyanagi, MD       Or   phenol (CHLORASEPTIC) mouth spray 1 spray  1 spray Mouth/Throat PRN Leandrew Koyanagi, MD       methocarbamol (ROBAXIN) tablet 500 mg  500 mg Oral Q6H PRN Leandrew Koyanagi, MD   500 mg at 06/10/20 0174   Or   methocarbamol (ROBAXIN) 500 mg in dextrose 5 % 50 mL IVPB  500 mg Intravenous Q6H PRN Leandrew Koyanagi, MD       metoprolol succinate (TOPROL-XL) 24 hr tablet 50 mg  50 mg Oral Daily Leandrew Koyanagi, MD   50 mg at 06/11/20 1036   morphine 2 MG/ML injection 2 mg  2 mg Intravenous Q2H PRN Leandrew Koyanagi, MD       ondansetron [facility]) tablet 4 mg  4 mg Oral Q6H PRN Leandrew Koyanagi, MD       Or   ondansetron [facility]) injection 4 mg  4 mg Intravenous Q6H PRN Leandrew Koyanagi, MD       polyethylene glycol (MIRALAX / GLYCOLAX) packet 17 g  17 g Oral Daily Charlynne Cousins, MD   17 g at 06/10/20 1000   potassium chloride SA (KLOR-CON) CR tablet 20 mEq  20 mEq Oral BID Charlynne Cousins, MD   20 mEq at 06/11/20 1036   simvastatin (ZOCOR) tablet 20 mg  20 mg Oral QHS Leandrew Koyanagi, MD   20 mg at 06/10/20 2202   sorbitol 70 % solution 30 mL  30 mL Oral Daily PRN Leandrew Koyanagi, MD         Discharge Medications: Please see discharge summary for a list of discharge medications.  Relevant Imaging Results:  Relevant Lab Results:   Additional Information SS#:[id number] [place], [place]

## 2020-06-11 NOTE — Progress Notes (Signed)
[facility] PROGRESS NOTE    Progress Note  Amanda Salas  ATF:[id number] DOB: [birth year] DOA: 06/09/2020 PCP: Abner Greenspan, MD     Brief Narrative:   Amanda Salas is an 86 y.o. female past medical history of essential hypertension hyperlipidemia dementia chronic thrombocytopenia comes in after a fall  Assessment/Plan:   Closed comminuted intertrochanteric fracture of left femur [facility]): Orthopedic surgery was consulted and recommended surgical intervention on 06/09/2020 displaced left peritrochanteric, subtrochanteric intramedullary implant. Continue Robaxin and narcotics for breakthrough pain. Physical therapy evaluated the patient recommended skilled nursing facility, TOC to evaluate. Ortho recommended 30 days of prophylactic Lovenox. Started on MiraLAX p.o. daily for 3 days.  Advanced dementia without behavioral disturbances: Continue Aricept.  Essential hypertension: Continue metoprolol, hold Cozaar due to acute renal failure.  Hyperlipidemia: Continue continue statins.  Acute kidney injury on stage IIIa chronic kidney disease: With a baseline creatinine of less than 1, likely prerenal azotemia. She was given IV fluids and 2 units of packed red blood cells her hemoglobin today is 1.0.  Hyperglycemia: Likely stress response continue sliding scale insulin.  Normocytic anemia/acute blood loss anemia: Likely due to surgery. Respiratory  Blood cells her hemoglobin this morning is 10.5.  DVT prophylaxis: lovenox Family Communication:daughter Status is: Inpatient  Remains inpatient appropriate because:Hemodynamically unstable   Dispo: The patient is from: Home              Anticipated d/c is to: Home              Anticipated d/c date is: 2 days              Patient currently is not medically stable to d/c.        Code Status:     Code Status Orders  (From admission, onward)         Start     Ordered   06/09/20 0824  Do not attempt  resuscitation (DNR)  Continuous       Question Answer Comment  In the event of cardiac or respiratory ARREST Do not call a "code blue"   In the event of cardiac or respiratory ARREST Do not perform Intubation, CPR, defibrillation or ACLS   In the event of cardiac or respiratory ARREST Use medication by any route, position, wound care, and other measures to relive pain and suffering. May use oxygen, suction and manual treatment of airway obstruction as needed for comfort.      06/09/20 0824        Code Status History    Date Active Date Inactive Code Status Order ID Comments User Context   10/30/2017 1832 11/04/2017 1528 Full Code [id number]  Lavina Hamman, MD ED   Advance Care Planning Activity    Advance Directive Documentation     Most Recent Value  Type of Advance Directive Out of facility DNR (pink MOST or yellow form), Healthcare Power of Attorney, Living will  Pre-existing out of facility DNR order (yellow form or pink MOST form) --  "MOST" Form in Place? --        IV Access:    Peripheral IV   Procedures and diagnostic studies:   DG C-Arm 1-60 Min  Result Date: 06/09/2020 CLINICAL DATA:  Left femoral intramedullary nail. EXAM: LEFT FEMUR 2 VIEWS; DG C-ARM 1-60 MIN COMPARISON:  Earlier 06/09/2020 FINDINGS: Examination demonstrates fixation of patient's left hip fracture with left femoral intramedullary nail bridging patient's subtrochanteric fracture and associated screws extending through the femoral  head into the femoral neck. Hardware is intact with near anatomic alignment over the fracture site. IMPRESSION: Near anatomic alignment of patient's left proximal femur fracture post fixation with hardware intact. Electronically Signed   By: Marin Olp M.D.   On: 06/09/2020 14:44   DG FEMUR MIN 2 VIEWS LEFT  Result Date: 06/09/2020 CLINICAL DATA:  Left femoral intramedullary nail. EXAM: LEFT FEMUR 2 VIEWS; DG C-ARM 1-60 MIN COMPARISON:  Earlier 06/09/2020 FINDINGS:  Examination demonstrates fixation of patient's left hip fracture with left femoral intramedullary nail bridging patient's subtrochanteric fracture and associated screws extending through the femoral head into the femoral neck. Hardware is intact with near anatomic alignment over the fracture site. IMPRESSION: Near anatomic alignment of patient's left proximal femur fracture post fixation with hardware intact. Electronically Signed   By: Marin Olp M.D.   On: 06/09/2020 14:44     Medical Consultants:    None.  Anti-Infectives:   cefazolin  Subjective:    Amanda Salas pain is controlled, had a bowel movement.  Objective:    Vitals:   06/10/20 1830 06/10/20 2128 06/11/20 0440 06/11/20 0827  BP: (!) 147/55 (!) 155/73 (!) 144/56 130/61  Pulse: 74 80 70 72  Resp: 16 18 18 12   Temp: 98 F (36.7 C) 98.5 F (36.9 C) 98.4 F (36.9 C) 98.3 F (36.8 C)  TempSrc: Oral Oral Oral Oral  SpO2: 97% 95% 94% 94%  Weight:      Height:       SpO2: 94 % O2 Flow Rate (L/min): 2 L/min   Intake/Output Summary (Last 24 hours) at 06/11/2020 1029 Last data filed at 06/10/2020 2128 Gross per 24 hour  Intake 844.23 ml  Output --  Net 844.23 ml   Filed Weights   06/09/20 0630  Weight: 52.2 kg    Exam: General exam: In no acute distress. Respiratory system: Good air movement and clear to auscultation. Cardiovascular system: S1 & S2 heard, RRR. No JVD. Gastrointestinal system: Abdomen is nondistended, soft and nontender.  Extremities: No pedal edema. Skin: No rashes, lesions or ulcers  Data Reviewed:    Labs: Basic Metabolic Panel: Recent Labs  Lab 06/09/20 0624 06/09/20 0624 06/10/20 0424 06/11/20 0352  NA 137  --  135 134*  K 3.4*   < > 3.5 4.1  CL 103  --  103 106  CO2 22  --  23 20*  GLUCOSE 137*  --  161* 116*  BUN 15  --  19 18  CREATININE 0.98  --  1.40* 1.04*  CALCIUM 9.0  --  8.0* 8.3*   < > = values in this interval not displayed.   GFR Estimated  Creatinine Clearance: 29.6 mL/min (A) (by C-G formula based on SCr of 1.04 mg/dL (H)). Liver Function Tests: No results for input(s): AST, ALT, ALKPHOS, BILITOT, PROT, ALBUMIN in the last 168 hours. No results for input(s): LIPASE, AMYLASE in the last 168 hours. No results for input(s): AMMONIA in the last 168 hours. Coagulation profile Recent Labs  Lab 06/09/20 0624  INR 1.0   COVID-19 Labs  No results for input(s): DDIMER, FERRITIN, LDH, CRP in the last 72 hours.  Lab Results  Component Value Date   [place] NEGATIVE 06/09/2020    CBC: Recent Labs  Lab 06/09/20 0624 06/10/20 0424 06/11/20 0352  WBC 10.4 8.3 9.8  NEUTROABS 8.5*  --   --   HGB 11.0* 6.7* 10.5*  HCT 34.9* 21.4* 31.9*  MCV 89.0 87.3 87.4  PLT 89* 85* 82*   Cardiac Enzymes: No results for input(s): CKTOTAL, CKMB, CKMBINDEX, TROPONINI in the last 168 hours. BNP (last 3 results) No results for input(s): PROBNP in the last 8760 hours. CBG: No results for input(s): GLUCAP in the last 168 hours. D-Dimer: No results for input(s): DDIMER in the last 72 hours. Hgb A1c: No results for input(s): HGBA1C in the last 72 hours. Lipid Profile: No results for input(s): CHOL, HDL, LDLCALC, TRIG, CHOLHDL, LDLDIRECT in the last 72 hours. Thyroid function studies: No results for input(s): TSH, T4TOTAL, T3FREE, THYROIDAB in the last 72 hours.  Invalid input(s): FREET3 Anemia work up: No results for input(s): VITAMINB12, FOLATE, FERRITIN, TIBC, IRON, RETICCTPCT in the last 72 hours. Sepsis Labs: Recent Labs  Lab 06/09/20 0624 06/10/20 0424 06/11/20 0352  WBC 10.4 8.3 9.8   Microbiology Recent Results (from the past 240 hour(s))  SARS Coronavirus 2 by RT PCR (hospital order, performed in [facility] hospital lab) Nasopharyngeal Nasopharyngeal Swab     Status: None   Collection Time: 06/09/20  6:39 AM   Specimen: Nasopharyngeal Swab  Result Value Ref Range Status   SARS Coronavirus 2 NEGATIVE NEGATIVE Final      Comment: (NOTE) SARS-CoV-2 target nucleic acids are NOT DETECTED.  The SARS-CoV-2 RNA is generally detectable in upper and lower respiratory specimens during the acute phase of infection. The lowest concentration of SARS-CoV-2 viral copies this assay can detect is 250 copies / mL. A negative result does not preclude SARS-CoV-2 infection and should not be used as the sole basis for treatment or other patient management decisions.  A negative result may occur with improper specimen collection / handling, submission of specimen other than nasopharyngeal swab, presence of viral mutation(s) within the areas targeted by this assay, and inadequate number of viral copies (<250 copies / mL). A negative result must be combined with clinical observations, patient history, and epidemiological information.  Fact Sheet for Patients:   StrictlyIdeas.no  Fact Sheet for Healthcare Providers: BankingDealers.co.za  This test is not yet approved or  cleared by the [place] FDA and has been authorized for detection and/or diagnosis of SARS-CoV-2 by FDA under an Emergency Use Authorization (EUA).  This EUA will remain in effect (meaning this test can be used) for the duration of the COVID-19 declaration under Section 564(b)(1) of the Act, 21 U.S.C. section 360bbb-3(b)(1), unless the authorization is terminated or revoked sooner.  Performed at [facility], [place] [address] [place], [place] [address]   Surgical pcr screen     Status: None   Collection Time: 06/09/20 10:42 AM   Specimen: Nasal Mucosa; Nasal Swab  Result Value Ref Range Status   MRSA, PCR NEGATIVE NEGATIVE Final   Staphylococcus aureus NEGATIVE NEGATIVE Final    Comment: (NOTE) The Xpert SA Assay (FDA approved for NASAL specimens in patients 57 years of age and older), is one component of a comprehensive surveillance program. It is not intended to diagnose infection nor  to guide or monitor treatment. Performed at [facility], [place] [address] [place], [place] [address]      Medications:   . docusate sodium  100 mg Oral BID  . donepezil  10 mg Oral QHS  . enoxaparin (LOVENOX) injection  30 mg Subcutaneous Q24H  . metoprolol succinate  50 mg Oral Daily  . polyethylene glycol  17 g Oral Daily  . potassium chloride  20 mEq Oral BID  . simvastatin  20 mg Oral QHS  Continuous Infusions: . methocarbamol (ROBAXIN) IV        LOS: 2 days   Charlynne Cousins  [facility]  06/11/2020, 10:29 AM

## 2020-06-11 NOTE — Progress Notes (Signed)
Patient's grand daughter stated that patient is back to her self.  Staff noticed that patient is more pleasant and less confused.

## 2020-06-11 NOTE — Progress Notes (Signed)
At bedside reporting patient was cursing at family and staff and behaving very confused.  This sudden change in behavior leads family to think it may be pain medication given earlier at 2pm.  Family has asked that we only give Tylenol for pain and hold off on the [place].

## 2020-06-11 NOTE — Progress Notes (Signed)
Physical Therapy Treatment Patient Details Name: Amanda Salas MRN: [id number] DOB: 26-Dec-1926 Today's Date: 06/11/2020    History of Present Illness Patient is a 86 y/o female who presents with left hip fx s/p fall. Now s/p IM nail 06/09/20. PMH includes Rt THA, HTN, HLD, mild dementia, osteoporosis.    PT Comments    Pt presents with less confusion today but more limited by pain. Max A needed for pt to come to EOB. Mod A for sit<>stand and to pivot to [facility]. Pt able to take only a few steps with RW but unable to tolerate functional distance due to pain. At this point, changing rec to SNF for further rehab before returning home. PT will continue to follow.    Follow Up Recommendations  Supervision for mobility/OOB;Supervision/Assistance - 24 hour;SNF     Equipment Recommendations  None recommended by PT    Recommendations for Other Services       Precautions / Restrictions Precautions Precautions: Fall Restrictions Weight Bearing Restrictions: Yes LLE Weight Bearing: Weight bearing as tolerated    Mobility  Bed Mobility Overal bed mobility: Needs Assistance Bed Mobility: Supine to Sit     Supine to sit: HOB elevated;Max assist     General bed mobility comments: pt needed hand over hand guidance to grasp L rail and move to L side of bed. Max A at LE's and trunk to achieve full upright sitting  Transfers Overall transfer level: Needs assistance Equipment used: Rolling walker (2 wheeled) Transfers: Sit to/from [facility] Sit to Stand: Mod assist;From elevated surface Stand pivot transfers: Mod assist;+2 safety/equipment       General transfer comment: first attempt to stand, pt resisted. She wanted to attempt standing on her own, was able to initiate mvmt and come to full standing with mod A for power up. First pivot to [facility] without AD, then stood to RW.   Ambulation/Gait Ambulation/Gait assistance: Mod assist Gait Distance (Feet): 2  Feet Assistive device: Rolling walker (2 wheeled) Gait Pattern/deviations: Step-to pattern;Step-through pattern;Decreased stance time - left;Decreased step length - right;Trunk flexed;Antalgic Gait velocity: decreased Gait velocity interpretation: <1.31 ft/sec, indicative of household ambulator General Gait Details: pt had difficulty with wt shifting and pain today, unable to ambulate functional distance   Stairs             Wheelchair Mobility    Modified Rankin (Stroke Patients Only)       Balance Overall balance assessment: Needs assistance;History of Falls Sitting-balance support: Feet supported;No upper extremity supported Sitting balance-Leahy Scale: Fair Sitting balance - Comments: supervision, posterior lean Postural control: Posterior lean Standing balance support: During functional activity Standing balance-Leahy Scale: Poor Standing balance comment: requires UE support and external support in standing.                            Cognition Arousal/Alertness: Awake/alert Behavior During Therapy: WFL for tasks assessed/performed Overall Cognitive Status: Impaired/Different from baseline Area of Impairment: Memory;Problem solving                     Memory: Decreased short-term memory       Problem Solving: Difficulty sequencing;Requires verbal cues;Requires tactile cues;Slow processing General Comments: cognition improved today, pt AxOx4      Exercises General Exercises - Lower Extremity Ankle Circles/Pumps: AROM;Both;10 reps;Supine;Seated Quad Sets: AROM;Both;10 reps;Seated Long Arc Quad: AROM;Left;5 reps;Seated    General Comments General comments (skin integrity, edema, etc.): daughter present  Pertinent Vitals/Pain Pain Assessment: Faces Faces Pain Scale: Hurts whole lot Pain Location: LLE with movement Pain Descriptors / Indicators: Sore;Operative site guarding;Grimacing;Guarding;Discomfort Pain Intervention(s):  Limited activity within patient's tolerance;Monitored during session    Home Living                      Prior Function            PT Goals (current goals can now be found in the care plan section) Acute Rehab PT Goals Patient Stated Goal: to get home PT Goal Formulation: With patient/family Time For Goal Achievement: 06/24/20 Potential to Achieve Goals: Good Progress towards PT goals: Not progressing toward goals - comment (pain)    Frequency    Min 3X/week      PT Plan Discharge plan needs to be updated    Co-evaluation              AM-PAC PT "6 Clicks" Mobility   Outcome Measure  Help needed turning from your back to your side while in a flat bed without using bedrails?: A Little Help needed moving from lying on your back to sitting on the side of a flat bed without using bedrails?: Total Help needed moving to and from a bed to a chair (including a wheelchair)?: A Lot Help needed standing up from a chair using your arms (e.g., wheelchair or bedside chair)?: A Lot Help needed to walk in hospital room?: Total Help needed climbing 3-5 steps with a railing? : Total 6 Click Score: 10    End of Session Equipment Utilized During Treatment: Gait belt Activity Tolerance: Patient limited by pain Patient left: in chair;with call bell/phone within reach;with family/visitor present Nurse Communication: Mobility status PT Visit Diagnosis: Pain;Difficulty in walking, not elsewhere classified (R26.2);Unsteadiness on feet (R26.81);Muscle weakness (generalized) (M62.81) Pain - Right/Left: Left Pain - part of body: Leg     Time: 0916-1001 PT Time Calculation (min) (ACUTE ONLY): 45 min  Charges:  $Gait Training: 8-22 mins $Therapeutic Exercise: 8-22 mins $Therapeutic Activity: 8-22 mins                     [place]  Pager [phone] Office [place] 06/11/2020, 10:45 AM

## 2020-06-11 NOTE — Plan of Care (Signed)
  Problem: Activity: Goal: Ability to ambulate and perform ADLs will improve Outcome: Progressing   Problem: Pain Management: Goal: Pain level will decrease Outcome: Progressing   Problem: Education: Goal: Knowledge of General Education information will improve Description: Including pain rating scale, medication(s)/side effects and non-pharmacologic comfort measures Outcome: Progressing   Problem: Health Behavior/Discharge Planning: Goal: Ability to manage health-related needs will improve Outcome: Progressing   Problem: Activity: Goal: Risk for activity intolerance will decrease Outcome: Progressing   Problem: Nutrition: Goal: Adequate nutrition will be maintained Outcome: Progressing   Problem: Coping: Goal: Level of anxiety will decrease Outcome: Progressing   Problem: Pain Managment: Goal: General experience of comfort will improve Outcome: Progressing

## 2020-06-12 DIAGNOSIS — D500 Iron deficiency anemia secondary to blood loss (chronic): Secondary | ICD-10-CM | POA: Diagnosis not present

## 2020-06-12 DIAGNOSIS — R262 Difficulty in walking, not elsewhere classified: Secondary | ICD-10-CM | POA: Diagnosis not present

## 2020-06-12 DIAGNOSIS — M79605 Pain in left leg: Secondary | ICD-10-CM | POA: Diagnosis not present

## 2020-06-12 DIAGNOSIS — M2550 Pain in unspecified joint: Secondary | ICD-10-CM | POA: Diagnosis not present

## 2020-06-12 DIAGNOSIS — M62838 Other muscle spasm: Secondary | ICD-10-CM | POA: Diagnosis not present

## 2020-06-12 DIAGNOSIS — D649 Anemia, unspecified: Secondary | ICD-10-CM | POA: Diagnosis not present

## 2020-06-12 DIAGNOSIS — M545 Low back pain: Secondary | ICD-10-CM | POA: Diagnosis not present

## 2020-06-12 DIAGNOSIS — E559 Vitamin D deficiency, unspecified: Secondary | ICD-10-CM | POA: Diagnosis not present

## 2020-06-12 DIAGNOSIS — E7800 Pure hypercholesterolemia, unspecified: Secondary | ICD-10-CM

## 2020-06-12 DIAGNOSIS — R52 Pain, unspecified: Secondary | ICD-10-CM | POA: Diagnosis not present

## 2020-06-12 DIAGNOSIS — M6281 Muscle weakness (generalized): Secondary | ICD-10-CM | POA: Diagnosis not present

## 2020-06-12 DIAGNOSIS — R5381 Other malaise: Secondary | ICD-10-CM | POA: Diagnosis not present

## 2020-06-12 DIAGNOSIS — R000 Tachycardia, unspecified: Secondary | ICD-10-CM | POA: Diagnosis not present

## 2020-06-12 DIAGNOSIS — F0390 Unspecified dementia without behavioral disturbance: Secondary | ICD-10-CM | POA: Diagnosis not present

## 2020-06-12 DIAGNOSIS — R2681 Unsteadiness on feet: Secondary | ICD-10-CM | POA: Diagnosis not present

## 2020-06-12 DIAGNOSIS — Z7401 Bed confinement status: Secondary | ICD-10-CM | POA: Diagnosis not present

## 2020-06-12 DIAGNOSIS — R739 Hyperglycemia, unspecified: Secondary | ICD-10-CM | POA: Diagnosis not present

## 2020-06-12 DIAGNOSIS — N1831 Chronic kidney disease, stage 3a: Secondary | ICD-10-CM | POA: Diagnosis not present

## 2020-06-12 DIAGNOSIS — S72142A Displaced intertrochanteric fracture of left femur, initial encounter for closed fracture: Secondary | ICD-10-CM | POA: Diagnosis not present

## 2020-06-12 DIAGNOSIS — Z9181 History of falling: Secondary | ICD-10-CM | POA: Diagnosis not present

## 2020-06-12 DIAGNOSIS — R488 Other symbolic dysfunctions: Secondary | ICD-10-CM | POA: Diagnosis not present

## 2020-06-12 DIAGNOSIS — R69 Illness, unspecified: Secondary | ICD-10-CM | POA: Diagnosis not present

## 2020-06-12 DIAGNOSIS — F0150 Vascular dementia without behavioral disturbance: Secondary | ICD-10-CM

## 2020-06-12 DIAGNOSIS — S72142D Displaced intertrochanteric fracture of left femur, subsequent encounter for closed fracture with routine healing: Secondary | ICD-10-CM | POA: Diagnosis not present

## 2020-06-12 DIAGNOSIS — N179 Acute kidney failure, unspecified: Secondary | ICD-10-CM | POA: Diagnosis not present

## 2020-06-12 DIAGNOSIS — E785 Hyperlipidemia, unspecified: Secondary | ICD-10-CM | POA: Diagnosis not present

## 2020-06-12 DIAGNOSIS — Z4789 Encounter for other orthopedic aftercare: Secondary | ICD-10-CM | POA: Diagnosis not present

## 2020-06-12 DIAGNOSIS — I10 Essential (primary) hypertension: Secondary | ICD-10-CM | POA: Diagnosis not present

## 2020-06-12 LAB — CBC
HCT: 29.8 % — ABNORMAL LOW (ref 36.0–46.0)
Hemoglobin: 9.6 g/dL — ABNORMAL LOW (ref 12.0–15.0)
MCH: 28.7 pg (ref 26.0–34.0)
MCHC: 32.2 g/dL (ref 30.0–36.0)
MCV: 89 fL (ref 80.0–100.0)
Platelets: 99 10*3/uL — ABNORMAL LOW (ref 150–400)
RBC: 3.35 MIL/uL — ABNORMAL LOW (ref 3.87–5.11)
RDW: 15.1 % (ref 11.5–15.5)
WBC: 7.5 10*3/uL (ref 4.0–10.5)
nRBC: 0 % (ref 0.0–0.2)

## 2020-06-12 LAB — BASIC METABOLIC PANEL
Anion gap: 8 (ref 5–15)
BUN: 15 mg/dL (ref 8–23)
CO2: 21 mmol/L — ABNORMAL LOW (ref 22–32)
Calcium: 8.2 mg/dL — ABNORMAL LOW (ref 8.9–10.3)
Chloride: 107 mmol/L (ref 98–111)
Creatinine, Ser: 0.87 mg/dL (ref 0.44–1.00)
GFR calc Af Amer: >60 mL/min (ref >60)
GFR calc non Af Amer: 59 mL/min — ABNORMAL LOW (ref >60)
Glucose, Bld: 95 mg/dL (ref 70–99)
Potassium: 4.2 mmol/L (ref 3.5–5.1)
Sodium: 136 mmol/L (ref 135–145)

## 2020-06-12 MED ORDER — DOCUSATE SODIUM 100 MG PO CAPS: 100.0000 mg | ORAL_CAPSULE | Freq: Two times a day (BID) | ORAL | 0 refills | Status: AC

## 2020-06-12 MED ORDER — METHOCARBAMOL 500 MG PO TABS: 500.0000 mg | ORAL_TABLET | Freq: Four times a day (QID) | ORAL | 0 refills | Status: DC | PRN

## 2020-06-12 MED ORDER — POTASSIUM CHLORIDE CRYS ER 20 MEQ PO TBCR: 20.0000 meq | EXTENDED_RELEASE_TABLET | Freq: Every day | ORAL | 0 refills | Status: DC

## 2020-06-12 MED ORDER — METOPROLOL SUCCINATE ER 50 MG PO TB24: 50.0000 mg | ORAL_TABLET | Freq: Every day | ORAL | 0 refills | Status: DC

## 2020-06-12 MED ORDER — ONDANSETRON HCL 4 MG PO TABS: 4.0000 mg | ORAL_TABLET | Freq: Four times a day (QID) | ORAL | 0 refills | Status: DC | PRN

## 2020-06-12 MED ORDER — MENTHOL 3 MG MT LOZG: 1.0000 | LOZENGE | OROMUCOSAL | 0 refills | Status: DC | PRN

## 2020-06-12 MED ORDER — MELATONIN 3 MG PO TABS: 3.0000 mg | ORAL_TABLET | Freq: Every evening | ORAL | 0 refills | Status: AC | PRN

## 2020-06-12 MED ORDER — SORBITOL 70 % SOLN: 30.0000 mL | Freq: Every day | 0 refills | Status: DC | PRN

## 2020-06-12 MED ORDER — ALUM & MAG HYDROXIDE-SIMETH 200-200-20 MG/5ML PO SUSP: 30.0000 mL | ORAL | 0 refills | Status: AC | PRN

## 2020-06-12 MED ORDER — ACETAMINOPHEN 325 MG PO TABS: 650.0000 mg | ORAL_TABLET | Freq: Four times a day (QID) | ORAL | 0 refills | Status: AC | PRN

## 2020-06-12 NOTE — Discharge Summary (Signed)
Physician Discharge Summary  Amanda Salas PPJ:[id number] DOB: [birth year] DOA: 06/09/2020  PCP: Abner Greenspan, MD  Admit date: 06/09/2020 Discharge date: 06/12/2020  Time spent: 30 minutes  Recommendations for Outpatient Follow-up:   Closed comminuted intertrochanteric fracture of left femur [facility]): Orthopedic surgery was consulted and recommended surgical intervention on 06/09/2020 displaced left peritrochanteric, subtrochanteric intramedullary implant. Continue Robaxin and narcotics for breakthrough pain. Physical therapy evaluated the patient recommended skilled nursing facility, TOC to evaluate. -Ortho recommended 30 days of prophylactic Lovenox. Started on MiraLAX p.o. daily for 3 days. -F/U in 2 weeks for staple removal with Orthopedic Surgery Dr Boston Service  Advanced dementia without behavioral disturbances: Continue Aricept.  Essential hypertension: Continue metoprolol -hold Cozaar due to acute renal failure.  Hyperlipidemia: Continue continue statins.  Acute kidney injury on stage IIIa chronic kidney disease:(baseline Cr <1) -likely prerenal azotemia. She was given IV fluids and 2 units of packed red blood cells . Recent Labs  Lab 06/09/20 0624 06/10/20 0424 06/11/20 0352 06/12/20 0408  CREATININE 0.98 1.40* 1.04* 0.87  -creatinine WNL   Hyperglycemia: Likely stress response continue sliding scale insulin.  Normocytic anemia/acute blood loss anemia: Likely due to surgery. Recent Labs  Lab 06/09/20 0624 06/10/20 0424 06/11/20 0352 06/12/20 0408  HGB 11.0* 6.7* 10.5* 9.6*  -stable    Discharge Diagnoses:  Principal Problem:   Closed comminuted intertrochanteric fracture of left femur (HCC) Active Problems:   Hyperlipidemia   Essential hypertension   Dementia without behavioral disturbance ([place])   Stage 3a chronic kidney disease   Hyperglycemia   Discharge Condition: stable  Diet recommendation: Regular  Filed Weights   06/09/20 0630   Weight: 52.2 kg    History of present illness:  86 y.o.WF PMHx HTN; HLD;dementia;and thrombocytopenia presenting with fall.Her daughter requests a spinal, does not want her put to sleep Sometime in the middle of the night, she went to get water and went to open a cabinet and she reported that her hip gave away. Her granddaughter found her in the floor. She was laying on the right side, but her fracture is on the left. She was feeling well prior. She did not go to rehab after the last hip fracture. They would prefer [facility] this time.   ED Course:Fell and broke hip, h/o the same on the other side in 2019. Dr. Erlinda Hong appears likely to operate today. Head CT No acute findings.   Hospital Course:  See above   Procedures: 6/20 Open treatment of pertrochanteric,subtrochanteric fracture with intramedullary implant. CPT [phone]    Consultations: Dr Boston Service Orthopedic Surgery     Discharge Exam: Vitals:   06/11/20 1600 06/11/20 2014 06/12/20 0352 06/12/20 0843  BP: 128/72 (!) 142/54 (!) 151/61 (!) 155/59  Pulse: 76 75 62 61  Resp: 16 20 18 14   Temp: 98.4 F (36.9 C) 99.5 F (37.5 C) 98.7 F (37.1 C) 98.7 F (37.1 C)  TempSrc: Oral Oral Oral Oral  SpO2: 96%  95% 98%  Weight:      Height:        General exam: In no acute distress. Respiratory system: Good air movement and clear to auscultation. Cardiovascular system: S1 & S2 heard, RRR. No JVD. Gastrointestinal system: Abdomen is nondistended, soft and nontender.  Extremities: No pedal edema. Skin: No rashes, lesions or ulcers   Discharge Instructions  Discharge Instructions    Weight bearing as tolerated   Complete by: As directed      Allergies as of 06/12/2020  Reactions   Ace Inhibitors    REACTION: cough   Alendronate Sodium    REACTION: pill got stuck      Medication List    STOP taking these medications   losartan 50 MG tablet Commonly known as: COZAAR   polyethylene glycol 17  g packet Commonly known as: MIRALAX / GLYCOLAX     TAKE these medications   acetaminophen 325 MG tablet Commonly known as: TYLENOL Take 2 tablets (650 mg total) by mouth every 6 (six) hours as needed for mild pain (pain score 1-3 or temp > 100.5). What changed: reasons to take this   alum & mag hydroxide-simeth 200-200-20 MG/5ML suspension Commonly known as: MAALOX/MYLANTA Take 30 mLs by mouth every 4 (four) hours as needed for indigestion.   docusate sodium 100 MG capsule Commonly known as: COLACE Take 1 capsule (100 mg total) by mouth 2 (two) times daily.   donepezil 10 MG tablet Commonly known as: ARICEPT Take 1 tablet (10 mg total) by mouth at bedtime.   enoxaparin 40 MG/0.4ML injection Commonly known as: LOVENOX Inject 0.4 mLs (40 mg total) into the skin daily.   HYDROcodone-acetaminophen 5-325 MG tablet Commonly known as: Norco Take 1-2 tablets by mouth 3 (three) times daily as needed.   melatonin 3 MG Tabs tablet Take 1 tablet (3 mg total) by mouth at bedtime as needed (sleep).   menthol-cetylpyridinium 3 MG lozenge Commonly known as: CEPACOL Take 1 lozenge (3 mg total) by mouth as needed for sore throat (sore throat).   methocarbamol 500 MG tablet Commonly known as: ROBAXIN Take 1 tablet (500 mg total) by mouth every 6 (six) hours as needed for muscle spasms.   metoprolol succinate 50 MG 24 hr tablet Commonly known as: TOPROL-XL Take 1 tablet (50 mg total) by mouth daily. Take with or immediately following a meal. Start taking on: June 13, 2020   ondansetron 4 MG tablet Commonly known as: ZOFRAN Take 1 tablet (4 mg total) by mouth every 6 (six) hours as needed for nausea.   potassium chloride SA 20 MEQ tablet Commonly known as: KLOR-CON Take 1 tablet (20 mEq total) by mouth daily.   simvastatin 20 MG tablet Commonly known as: ZOCOR Take 1 tablet (20 mg total) by mouth at bedtime.   sorbitol 70 % Soln Take 30 mLs by mouth daily as needed for moderate  constipation.   VITAMIN D3 PO Take 1,000 Units daily by mouth.            Discharge Care Instructions  (From admission, onward)         Start     Ordered   06/09/20 0000  Weight bearing as tolerated        06/09/20 1257         Allergies  Allergen Reactions  . Ace Inhibitors     REACTION: cough  . Alendronate Sodium     REACTION: pill got stuck    Follow-up Information    Leandrew Koyanagi, MD In 2 weeks.   Specialty: Orthopedic Surgery Why: For suture removal, For wound re-check Contact information: [place] [id number] [phone]                The results of significant diagnostics from this hospitalization (including imaging, microbiology, ancillary and laboratory) are listed below for reference.    Significant Diagnostic Studies: DG Chest 1 View  Result Date: 06/09/2020 CLINICAL DATA:  Hip fracture EXAM: CHEST  1 VIEW COMPARISON:  None.  FINDINGS: Normal mediastinum and cardiac silhouette. Normal pulmonary vasculature. No evidence of effusion, infiltrate, or pneumothorax. No acute bony abnormality. IMPRESSION: No acute cardiopulmonary process. Electronically Signed   By: Suzy Bouchard M.D.   On: 06/09/2020 07:42   CT HEAD WO CONTRAST  Result Date: 06/09/2020 CLINICAL DATA:  LEFT hip fracture.  Fall. EXAM: CT HEAD WITHOUT CONTRAST TECHNIQUE: Contiguous axial images were obtained from the base of the skull through the vertex without intravenous contrast. COMPARISON:  None. FINDINGS: Brain: No acute intracranial hemorrhage. No focal mass lesion. No CT evidence of acute infarction. No midline shift or mass effect. No hydrocephalus. Basilar cisterns are patent. There are periventricular and subcortical white matter hypodensities. Generalized cortical atrophy. Vascular: No hyperdense vessel or unexpected calcification. Skull: Normal. Negative for fracture or focal lesion. Sinuses/Orbits: Paranasal sinuses and mastoid air cells are clear.  Orbits are clear. Other: None. IMPRESSION: 1. No acute intracranial findings. 2. Atrophy and white matter microvascular disease. Electronically Signed   By: Suzy Bouchard M.D.   On: 06/09/2020 07:57   DG C-Arm 1-60 Min  Result Date: 06/09/2020 CLINICAL DATA:  Left femoral intramedullary nail. EXAM: LEFT FEMUR 2 VIEWS; DG C-ARM 1-60 MIN COMPARISON:  Earlier 06/09/2020 FINDINGS: Examination demonstrates fixation of patient's left hip fracture with left femoral intramedullary nail bridging patient's subtrochanteric fracture and associated screws extending through the femoral head into the femoral neck. Hardware is intact with near anatomic alignment over the fracture site. IMPRESSION: Near anatomic alignment of patient's left proximal femur fracture post fixation with hardware intact. Electronically Signed   By: Marin Olp M.D.   On: 06/09/2020 14:44   DG Hip Unilat With Pelvis 2-3 Views Left  Result Date: 06/09/2020 CLINICAL DATA:  Fall at home. EXAM: DG HIP (WITH OR WITHOUT PELVIS) 2-3V LEFT COMPARISON:  None. FINDINGS: Intertrochanteric fracture of the LEFT femur with varus angulation. RIGHT hip arthroplasty noted. Vascular calcifications IMPRESSION: LEFT intertrochanteric femur fracture. Electronically Signed   By: Suzy Bouchard M.D.   On: 06/09/2020 07:41   DG FEMUR MIN 2 VIEWS LEFT  Result Date: 06/09/2020 CLINICAL DATA:  Left femoral intramedullary nail. EXAM: LEFT FEMUR 2 VIEWS; DG C-ARM 1-60 MIN COMPARISON:  Earlier 06/09/2020 FINDINGS: Examination demonstrates fixation of patient's left hip fracture with left femoral intramedullary nail bridging patient's subtrochanteric fracture and associated screws extending through the femoral head into the femoral neck. Hardware is intact with near anatomic alignment over the fracture site. IMPRESSION: Near anatomic alignment of patient's left proximal femur fracture post fixation with hardware intact. Electronically Signed   By: Marin Olp M.D.    On: 06/09/2020 14:44    Microbiology: Recent Results (from the past 240 hour(s))  SARS Coronavirus 2 by RT PCR (hospital order, performed in [facility] hospital lab) Nasopharyngeal Nasopharyngeal Swab     Status: None   Collection Time: 06/09/20  6:39 AM   Specimen: Nasopharyngeal Swab  Result Value Ref Range Status   SARS Coronavirus 2 NEGATIVE NEGATIVE Final    Comment: (NOTE) SARS-CoV-2 target nucleic acids are NOT DETECTED.  The SARS-CoV-2 RNA is generally detectable in upper and lower respiratory specimens during the acute phase of infection. The lowest concentration of SARS-CoV-2 viral copies this assay can detect is 250 copies / mL. A negative result does not preclude SARS-CoV-2 infection and should not be used as the sole basis for treatment or other patient management decisions.  A negative result may occur with improper specimen collection / handling, submission of specimen other than nasopharyngeal  swab, presence of viral mutation(s) within the areas targeted by this assay, and inadequate number of viral copies (<250 copies / mL). A negative result must be combined with clinical observations, patient history, and epidemiological information.  Fact Sheet for Patients:   StrictlyIdeas.no  Fact Sheet for Healthcare Providers: BankingDealers.co.za  This test is not yet approved or  cleared by the [place] FDA and has been authorized for detection and/or diagnosis of SARS-CoV-2 by FDA under an Emergency Use Authorization (EUA).  This EUA will remain in effect (meaning this test can be used) for the duration of the COVID-19 declaration under Section 564(b)(1) of the Act, 21 U.S.C. section 360bbb-3(b)(1), unless the authorization is terminated or revoked sooner.  Performed at [facility], [place] [address] [place], [place] [address]   Surgical pcr screen     Status: None   Collection Time: 06/09/20 10:42 AM    Specimen: Nasal Mucosa; Nasal Swab  Result Value Ref Range Status   MRSA, PCR NEGATIVE NEGATIVE Final   Staphylococcus aureus NEGATIVE NEGATIVE Final    Comment: (NOTE) The Xpert SA Assay (FDA approved for NASAL specimens in patients 60 years of age and older), is one component of a comprehensive surveillance program. It is not intended to diagnose infection nor to guide or monitor treatment. Performed at [facility], [place] [address], [place], [place] [address]      Labs: Basic Metabolic Panel: Recent Labs  Lab 06/09/20 [phone] 06/10/20 0424 06/11/20 0352 06/12/20 0408  NA 137 135 134* 136  K 3.4* 3.5 4.1 4.2  CL 103 103 106 107  CO2 22 23 20* 21*  GLUCOSE 137* 161* 116* 95  BUN 15 19 18 15   CREATININE 0.98 1.40* 1.04* 0.87  CALCIUM 9.0 8.0* 8.3* 8.2*   Liver Function Tests: No results for input(s): AST, ALT, ALKPHOS, BILITOT, PROT, ALBUMIN in the last 168 hours. No results for input(s): LIPASE, AMYLASE in the last 168 hours. No results for input(s): AMMONIA in the last 168 hours. CBC: Recent Labs  Lab 06/09/20 0624 06/10/20 0424 06/11/20 0352 06/12/20 0408  WBC 10.4 8.3 9.8 7.5  NEUTROABS 8.5*  --   --   --   HGB 11.0* 6.7* 10.5* 9.6*  HCT 34.9* 21.4* 31.9* 29.8*  MCV 89.0 87.3 87.4 89.0  PLT 89* 85* 82* 99*   Cardiac Enzymes: No results for input(s): CKTOTAL, CKMB, CKMBINDEX, TROPONINI in the last 168 hours. BNP: BNP (last 3 results) No results for input(s): BNP in the last 8760 hours.  ProBNP (last 3 results) No results for input(s): PROBNP in the last 8760 hours.  CBG: No results for input(s): GLUCAP in the last 168 hours.     Signed:  Dia Crawford, MD [facility] [phone] pager

## 2020-06-12 NOTE — TOC Progression Note (Signed)
Transition of Care [facility]) - Progression Note    Patient Details  Name: JESSELYN RASK MRN: [id number] Date of Birth: [birth year]  Transition of Care [facility]) CM/SW Contact  Sharin Mons, RN Phone Number: [phone] 06/12/2020, 12:20 PM  Clinical Narrative:    Insurance authorization pending for SNF placement .  TOC team will continue to monitor and follow.....   Expected Discharge Plan: [place] ([place]) Barriers to Discharge: Insurance Authorization  Expected Discharge Plan and Services Expected Discharge Plan: [place] ([place])   Discharge Planning Services: CM Consult                               HH Arranged: PT, OT, Nurse's Aide [place] Agency: [facility] [employer] Date [place]: 06/11/20 Time [place]: 27 Representative spoke with at [place]: [place] ([place]) Interventions    Readmission Risk Interventions No flowsheet data found.

## 2020-06-12 NOTE — TOC Transition Note (Addendum)
Transition of Care [facility]) - CM/SW Discharge Note   Patient Details  Name: Amanda Salas MRN: [id number] Date of Birth: Dec 13, 1927  Transition of [facility]) CM/SW Contact:  Sharin Mons, RN Phone Number: [phone] 06/12/2020, 3:40 PM   Clinical Narrative:     [name]  Anticipated DC date: 06/12/2020 Family notified: [name] ( daughter) Transport by: [name]   Per MD patient ready for DC today . RN, patient, patient's family, and facility notified of DC. Discharge Summary and FL2 sent to facility. RN to call report prior to discharge [phone]). DC packet on chart. Ambulance transport requested for patient.   RNCM will sign off for now as intervention is no longer needed. Please consult [place] again if new needs arise.   Final next level of care: [name]: No Barriers Identified   Patient Goals and CMS Choice     Choice offered to / list presented to : Adult Children [name] (Daughter)[phone])  Discharge Placement   Discharge Plan and Services   Discharge Planning Services: CM Consult    Social Determinants of Health (SDOH) Interventions     Readmission Risk Interventions No flowsheet data found.

## 2020-06-12 NOTE — Plan of Care (Signed)

## 2020-06-12 NOTE — Progress Notes (Signed)
Discharge packet printed. Called facility x3 unable to talk to nurse.

## 2020-06-13 DIAGNOSIS — R52 Pain, unspecified: Secondary | ICD-10-CM | POA: Diagnosis not present

## 2020-06-13 DIAGNOSIS — S72142D Displaced intertrochanteric fracture of left femur, subsequent encounter for closed fracture with routine healing: Secondary | ICD-10-CM | POA: Diagnosis not present

## 2020-06-13 DIAGNOSIS — E785 Hyperlipidemia, unspecified: Secondary | ICD-10-CM | POA: Diagnosis not present

## 2020-06-13 DIAGNOSIS — Z9181 History of falling: Secondary | ICD-10-CM | POA: Diagnosis not present

## 2020-06-13 DIAGNOSIS — N179 Acute kidney failure, unspecified: Secondary | ICD-10-CM | POA: Diagnosis not present

## 2020-06-13 DIAGNOSIS — F0390 Unspecified dementia without behavioral disturbance: Secondary | ICD-10-CM | POA: Diagnosis not present

## 2020-06-13 DIAGNOSIS — I10 Essential (primary) hypertension: Secondary | ICD-10-CM | POA: Diagnosis not present

## 2020-06-13 DIAGNOSIS — D500 Iron deficiency anemia secondary to blood loss (chronic): Secondary | ICD-10-CM | POA: Diagnosis not present

## 2020-06-17 DIAGNOSIS — S72142D Displaced intertrochanteric fracture of left femur, subsequent encounter for closed fracture with routine healing: Secondary | ICD-10-CM | POA: Diagnosis not present

## 2020-06-17 DIAGNOSIS — D500 Iron deficiency anemia secondary to blood loss (chronic): Secondary | ICD-10-CM | POA: Diagnosis not present

## 2020-06-17 DIAGNOSIS — N179 Acute kidney failure, unspecified: Secondary | ICD-10-CM | POA: Diagnosis not present

## 2020-06-17 DIAGNOSIS — R52 Pain, unspecified: Secondary | ICD-10-CM | POA: Diagnosis not present

## 2020-06-18 DIAGNOSIS — R52 Pain, unspecified: Secondary | ICD-10-CM | POA: Diagnosis not present

## 2020-06-18 DIAGNOSIS — F0390 Unspecified dementia without behavioral disturbance: Secondary | ICD-10-CM | POA: Diagnosis not present

## 2020-06-19 ENCOUNTER — Telehealth: Payer: Self-pay | Admitting: Family Medicine

## 2020-06-19 DIAGNOSIS — S72142A Displaced intertrochanteric fracture of left femur, initial encounter for closed fracture: Secondary | ICD-10-CM

## 2020-06-19 DIAGNOSIS — F0150 Vascular dementia without behavioral disturbance: Secondary | ICD-10-CM

## 2020-06-19 NOTE — Telephone Encounter (Signed)
Will need PT and OT and personal care. Social Services told her she would need a referral from PCP to [facility]. Not sure when she will be d/c from [facility]. Next meeting 06-26-20.

## 2020-06-19 NOTE — Telephone Encounter (Signed)
Thanks for letting me know -I will not place order until we know what her d/c date will be  Keep me posted, thanks

## 2020-06-19 NOTE — Telephone Encounter (Signed)
Patient's Granddaughter Glenard Haring called today They would like a referral sent to [facility] for Home health. She stated that the patient is in a rehab right now because she broke her femur and they are trying to line up [facility] for the patient when she is released

## 2020-06-19 NOTE — Telephone Encounter (Signed)
Do have them check with orthopedics first to see if they have special recommendations re: home PT   What are her current needs ? Any wound or dressing changes or IV medications needed?   (I suspect it will be more for PT and mobility issues)

## 2020-06-21 DIAGNOSIS — S72142D Displaced intertrochanteric fracture of left femur, subsequent encounter for closed fracture with routine healing: Secondary | ICD-10-CM | POA: Diagnosis not present

## 2020-06-21 DIAGNOSIS — R52 Pain, unspecified: Secondary | ICD-10-CM | POA: Diagnosis not present

## 2020-06-25 ENCOUNTER — Other Ambulatory Visit: Payer: Self-pay

## 2020-06-25 ENCOUNTER — Ambulatory Visit (INDEPENDENT_AMBULATORY_CARE_PROVIDER_SITE_OTHER): Payer: Medicare HMO | Admitting: Orthopaedic Surgery

## 2020-06-25 ENCOUNTER — Ambulatory Visit (INDEPENDENT_AMBULATORY_CARE_PROVIDER_SITE_OTHER): Payer: Medicare HMO

## 2020-06-25 DIAGNOSIS — M79605 Pain in left leg: Secondary | ICD-10-CM

## 2020-06-25 DIAGNOSIS — D500 Iron deficiency anemia secondary to blood loss (chronic): Secondary | ICD-10-CM | POA: Diagnosis not present

## 2020-06-25 DIAGNOSIS — R52 Pain, unspecified: Secondary | ICD-10-CM | POA: Diagnosis not present

## 2020-06-25 DIAGNOSIS — N179 Acute kidney failure, unspecified: Secondary | ICD-10-CM | POA: Diagnosis not present

## 2020-06-25 NOTE — Progress Notes (Signed)
Post-Op Visit Note   Patient: Amanda Salas           Date of Birth: [birth year]           MRN: [id number] Visit Date: 06/25/2020 PCP: Abner Greenspan, MD   Assessment & Plan:  Chief Complaint:  Chief Complaint  Patient presents with  . Left Leg - Routine Post Op   Visit Diagnoses:  1. Pain in left leg     Plan: Patient is 2 weeks status post IM nail for left subtrochanteric femur fracture.  Overall doing fine at the SNF.  Her daughter and granddaughter are both with her today.  Surgical incisions are healed and staples were removed.  No signs of infection.  X-rays demonstrate stable fixation and alignment of this femur fracture.  No complications of the hardware.  Orders were written for SNF to continue PT.  Weight-bear as tolerated.  Recheck in 4 weeks with two-view x-rays of the right femur.  Follow-Up Instructions: Return in about 4 weeks (around 07/23/2020).   Orders:  Orders Placed This Encounter  Procedures  . XR FEMUR MIN 2 VIEWS LEFT   No orders of the defined types were placed in this encounter.   Imaging: XR FEMUR MIN 2 VIEWS LEFT  Result Date: 06/25/2020 Stable IM fixation of subtrochanteric fracture without complication.   PMFS History: Patient Active Problem List   Diagnosis Date Noted  . Closed comminuted intertrochanteric fracture of left femur ([place]) 06/09/2020  . Dementia without behavioral disturbance ([place]) 06/09/2020  . Stage 3a chronic kidney disease 06/09/2020  . Hyperglycemia 06/09/2020  . Medicare annual wellness visit, subsequent 08/30/2018  . Routine general medical examination at a health care facility 08/30/2018  . Advance care planning 08/30/2018  . H/O fracture of hip 10/30/2017  . Dystrophic nail 04/23/2015  . Encounter for Medicare annual wellness exam 04/17/2014  . Colon cancer screening 04/17/2014  . Anemia 04/17/2014  . History of vertebral compression fracture 12/26/2013  . Low back pain 11/22/2013  . Mycotic toenails  11/04/2011  . MCI (mild cognitive impairment) 12/29/2010  . Vitamin D deficiency 07/01/2010  . FALL, HX OF 07/01/2010  . Hyperlipidemia 08/10/2008  . Essential hypertension 08/10/2008  . ALLERGIC RHINITIS 08/10/2008  . GERD 08/10/2008  . Osteoporosis 08/10/2008   Past Medical History:  Diagnosis Date  . Allergy   . Arthritis   . Cataract   . Dementia ([place])   . Disorder of bone and cartilage, unspecified   . GERD (gastroesophageal reflux disease)   . Hepatitis   . Hyperlipidemia   . Hypertension   . Thrombocytopenia, unspecified ([place])     Family History  Problem Relation Age of Onset  . Alzheimer's disease Mother     Past Surgical History:  Procedure Laterality Date  . CARDIAC CATHETERIZATION    . ESOPHAGOGASTRODUODENOSCOPY    . INTRAMEDULLARY (IM) NAIL INTERTROCHANTERIC Left 06/09/2020  . INTRAMEDULLARY (IM) NAIL INTERTROCHANTERIC Left 06/09/2020   Procedure: INTRAMEDULLARY (IM) NAIL INTERTROCHANTRIC;  Surgeon: Leandrew Koyanagi, MD;  Location: [place];  Service: Orthopedics;  Laterality: Left;  . TOTAL HIP ARTHROPLASTY Right 10/31/2017   Procedure: TOTAL HIP ARTHROPLASTY DIRECT ANTERIOR APPROACH;  Surgeon: Leandrew Koyanagi, MD;  Location: [place];  Service: Orthopedics;  Laterality: Right;   Social History   Occupational History  . Occupation: retired  Tobacco Use  . Smoking status: Never Smoker  . Smokeless tobacco: Never Used  Vaping Use  . Vaping Use: Never used  Substance and Sexual  Activity  . Alcohol use: No    Alcohol/week: 0.0 standard drinks  . Drug use: No  . Sexual activity: Not Currently

## 2020-06-27 DIAGNOSIS — R52 Pain, unspecified: Secondary | ICD-10-CM | POA: Diagnosis not present

## 2020-06-27 DIAGNOSIS — S72142D Displaced intertrochanteric fracture of left femur, subsequent encounter for closed fracture with routine healing: Secondary | ICD-10-CM | POA: Diagnosis not present

## 2020-06-27 DIAGNOSIS — R000 Tachycardia, unspecified: Secondary | ICD-10-CM | POA: Diagnosis not present

## 2020-06-27 NOTE — Telephone Encounter (Signed)
Amanda Salas called asking for documentation of Dementia diagnosis for when they do release her from [facility]. I advised her at both visits that mention memory, it is noted as Mild Cognitive Impairment. She will work with the NP at  [facility] to see what they have down for her.

## 2020-07-01 DIAGNOSIS — R000 Tachycardia, unspecified: Secondary | ICD-10-CM | POA: Diagnosis not present

## 2020-07-01 DIAGNOSIS — N179 Acute kidney failure, unspecified: Secondary | ICD-10-CM | POA: Diagnosis not present

## 2020-07-01 DIAGNOSIS — I10 Essential (primary) hypertension: Secondary | ICD-10-CM | POA: Diagnosis not present

## 2020-07-05 ENCOUNTER — Other Ambulatory Visit: Payer: Self-pay | Admitting: *Deleted

## 2020-07-05 NOTE — Patient Outreach (Signed)
[place] [facility]) Care Management  07/05/2020  PINKIE MANGER 1927-01-15 [id number]  Transition of care Referral  Referral Date : 07/05/20 Referral source: [facility] Discharge Notification  Date of Discharge:07/04/20 Facility: [facility]   Insurance: [facility]   Referral received. Transition of care calls being completed via EMMI-automated calls. RN CM will outreach patient for any red flags received.   Case Closure Reason: enrolled in another program   Joylene Draft, RN, BSN  [place] Management Coordinator  [phone]- [place]

## 2020-07-08 NOTE — Addendum Note (Signed)
Addended by: Loura Pardon A on: 07/08/2020 04:44 PM   Modules accepted: Orders

## 2020-07-08 NOTE — Telephone Encounter (Signed)
Patient's daughter called. Patient was discharged from [facility] on 07/04/20. Daughter was very disappointed with the care patient received at [facility] and patient's daughter took patient home. Patient was doing well with physical therapy at [facility].  [facility] said they referred patient to [facility], but daughter called [facility] and they said they haven't received a referral.  Daughter is concerned because she wants patient to get started with home physical therapy as soon as possible.  Can Dr.Tower refer patient to [facility]?  Please call [name], daughter, phone number [phone].

## 2020-07-08 NOTE — Telephone Encounter (Signed)
Referral done Will route to [place]

## 2020-07-09 NOTE — Telephone Encounter (Signed)
[facility] will accept the patient.

## 2020-07-10 ENCOUNTER — Telehealth: Payer: Self-pay | Admitting: *Deleted

## 2020-07-10 NOTE — Telephone Encounter (Signed)
The last phone note stated that the nursing home did NOT do the referral so they wanted me to do it: Note as follows: Note Patient's daughter called. Patient was discharged from [facility] on 07/04/20. Daughter was very disappointed with the care patient received at [facility] and patient's daughter took patient home. Patient was doing well with physical therapy at [facility].  [facility] said they referred patient to [facility], but daughter called [facility] and they said they haven't received a referral.  Daughter is concerned because she wants patient to get started with home physical therapy as soon as possible.  Can Dr.Artice Holohan refer patient to [facility]?  Please call [name], daughter, phone number [phone].    So I guess I would tell home health to go with my order

## 2020-07-10 NOTE — Telephone Encounter (Signed)
Left VM letting Colletta Maryland know Dr. Marliss Coots comments

## 2020-07-10 NOTE — Telephone Encounter (Signed)
Colletta [place] with [facility] left a voicemail stating that they have gotten a referral from the skilled nursing facility where the patient was. Colletta [place] stated that they have also gotten a referral from Dr. Glori Bickers. Colletta [place] is calling to find out what service are needed for the patient since they have gotten two referrals.

## 2020-07-12 ENCOUNTER — Telehealth: Payer: Self-pay | Admitting: Orthopaedic Surgery

## 2020-07-12 DIAGNOSIS — I129 Hypertensive chronic kidney disease with stage 1 through stage 4 chronic kidney disease, or unspecified chronic kidney disease: Secondary | ICD-10-CM | POA: Diagnosis not present

## 2020-07-12 DIAGNOSIS — J309 Allergic rhinitis, unspecified: Secondary | ICD-10-CM | POA: Diagnosis not present

## 2020-07-12 DIAGNOSIS — M80052D Age-related osteoporosis with current pathological fracture, left femur, subsequent encounter for fracture with routine healing: Secondary | ICD-10-CM | POA: Diagnosis not present

## 2020-07-12 DIAGNOSIS — M545 Low back pain: Secondary | ICD-10-CM | POA: Diagnosis not present

## 2020-07-12 DIAGNOSIS — N1831 Chronic kidney disease, stage 3a: Secondary | ICD-10-CM | POA: Diagnosis not present

## 2020-07-12 DIAGNOSIS — M1990 Unspecified osteoarthritis, unspecified site: Secondary | ICD-10-CM | POA: Diagnosis not present

## 2020-07-12 DIAGNOSIS — D509 Iron deficiency anemia, unspecified: Secondary | ICD-10-CM | POA: Diagnosis not present

## 2020-07-12 DIAGNOSIS — K219 Gastro-esophageal reflux disease without esophagitis: Secondary | ICD-10-CM | POA: Diagnosis not present

## 2020-07-12 DIAGNOSIS — F0390 Unspecified dementia without behavioral disturbance: Secondary | ICD-10-CM | POA: Diagnosis not present

## 2020-07-12 NOTE — Telephone Encounter (Signed)
[facility] with [facility] would like to place verbal oders with frequency of  Once week for 1 week, twice a week for 4 weeks , and  Once a week for 4 weeks.  As well as an order for a medical social worker assessment   Graci CB# [phone]

## 2020-07-12 NOTE — Telephone Encounter (Signed)
I called and gave verbal orders.

## 2020-07-15 ENCOUNTER — Encounter: Payer: Self-pay | Admitting: Family Medicine

## 2020-07-15 ENCOUNTER — Ambulatory Visit (INDEPENDENT_AMBULATORY_CARE_PROVIDER_SITE_OTHER): Payer: Medicare HMO | Admitting: Family Medicine

## 2020-07-15 ENCOUNTER — Telehealth: Payer: Self-pay

## 2020-07-15 VITALS — BP 132/76 | HR 68 | Temp 96.7°F | Ht 63.0 in | Wt 113.2 lb

## 2020-07-15 DIAGNOSIS — M8000XS Age-related osteoporosis with current pathological fracture, unspecified site, sequela: Secondary | ICD-10-CM

## 2020-07-15 DIAGNOSIS — N1831 Chronic kidney disease, stage 3a: Secondary | ICD-10-CM | POA: Diagnosis not present

## 2020-07-15 DIAGNOSIS — I10 Essential (primary) hypertension: Secondary | ICD-10-CM

## 2020-07-15 DIAGNOSIS — Z8781 Personal history of (healed) traumatic fracture: Secondary | ICD-10-CM

## 2020-07-15 DIAGNOSIS — G3184 Mild cognitive impairment, so stated: Secondary | ICD-10-CM | POA: Diagnosis not present

## 2020-07-15 DIAGNOSIS — D509 Iron deficiency anemia, unspecified: Secondary | ICD-10-CM | POA: Diagnosis not present

## 2020-07-15 DIAGNOSIS — S72142A Displaced intertrochanteric fracture of left femur, initial encounter for closed fracture: Secondary | ICD-10-CM

## 2020-07-15 MED ORDER — LOSARTAN POTASSIUM 50 MG PO TABS
50.0000 mg | ORAL_TABLET | Freq: Every day | ORAL | 11 refills | Status: DC
Start: 2020-07-15 — End: 2021-08-05

## 2020-07-15 NOTE — Assessment & Plan Note (Signed)
Hb 9.6 after hip surgery Not on iron  Did have a transfusion Re check cbc with ferritin today

## 2020-07-15 NOTE — Telephone Encounter (Signed)
I received a call from [place] with [facility] - requesting VO for PT 1x a week for 1 week, 2x a week for 4 weeks, and 1x a week for 4 weeks, and also a Copywriter, advertising. Shirlee Limerick can be reached back at [phone]

## 2020-07-15 NOTE — Telephone Encounter (Signed)
Left VM giving Shirlee Limerick the VO

## 2020-07-15 NOTE — Assessment & Plan Note (Signed)
This was surgically repaired Pt doing very well  Has f/u with orthopedics in early august Continues PT/ OT at home (now home from rehab facility)  Reviewed hospital records, lab results and studies in detail  Cbc today (anemic after surgery)

## 2020-07-15 NOTE — Assessment & Plan Note (Signed)
Has now fx both hips Recovering well

## 2020-07-15 NOTE — Patient Instructions (Signed)
Lab today for cbc /iron stores and kidney function   Supplement meals if needed  Also fluids  Limit caffeine  Proceed with PT as planned  Keep me posted

## 2020-07-15 NOTE — Telephone Encounter (Signed)
Please ok those verbal orders  

## 2020-07-15 NOTE — Assessment & Plan Note (Signed)
Improved renal labs after hydration in hospital for hip fracture  Re check today  Family is working to keep her hydrated  Bmp today  bp is well controlled/back on losartan

## 2020-07-15 NOTE — Progress Notes (Signed)
Subjective:    Patient ID: Amanda Salas, female    DOB: 08-30-1927, 86 y.o.   MRN: [id number]  This visit occurred during the SARS-CoV-2 public health emergency.  Safety protocols were in place, including screening questions prior to the visit, additional usage of staff PPE, and extensive cleaning of exam room while observing appropriate contact time as indicated for disinfecting solutions.    HPI Pt presents for f/u of hosp and then rehab stay hosp from 6/20 to 06/12/20 for closed comminuted intertrochanteric L femur fracture  Per family -did not have a good experience with the rehab  Unhappy after 3 weeks  Doing well at home now-much better  [facility] is going to come out twice weekly for 4 weeks  They did assessment on Friday   Had surgery with subtrochanteric intramedullary implant  30 days of lovenox was given for prophylaxis   Amanda Salas was transfused prbc and given fluids   The hospital had her hold losartan -but pt's family has been giving it   Wt Readings from Last 3 Encounters:  07/15/20 113 lb 3 oz (51.3 kg)  06/09/20 115 lb (52.2 kg)  03/25/20 113 lb 12 oz (51.6 kg)   20.05 kg/m   Appetite is back  Getting along with the walker fairly well   Had multiple falls in the past when Amanda Salas forgets to use the walker  Family has cameras on her as well as 24 h care Being careful    Has been immunized for covid    HTN bp is stable today  No cp or palpitations or headaches or edema  No side effects to medicines  BP Readings from Last 3 Encounters:  07/15/20 (!) 132/76  06/12/20 (!) 146/60  03/25/20 132/64     Taking metoprolol   Pulse Readings from Last 3 Encounters:  07/15/20 68  06/12/20 74  03/25/20 (!) 49       Pulse Readings from Last 3 Encounters:  07/15/20 68  06/12/20 74  03/25/20 (!) 49   Amanda Salas has known OP  Has taken miacalcin for vert comp fx Completed 5 y of evista a year ago   Noted stage 3a kidney disease Lab Results  Component Value  Date   CREATININE 0.87 06/12/2020   BUN 15 06/12/2020   NA 136 06/12/2020   K 4.2 06/12/2020   CL 107 06/12/2020   CO2 21 (L) 06/12/2020   Improved with hydration  Glucose of 95 at that draw   Lab Results  Component Value Date   WBC 7.5 06/12/2020   HGB 9.6 (L) 06/12/2020   HCT 29.8 (L) 06/12/2020   MCV 89.0 06/12/2020   PLT 99 (L) 06/12/2020    Mci/early dementia seems stable and mood is good  Taking aricept -no problems with it Appetite is very good   Patient Active Problem List   Diagnosis Date Noted   Closed comminuted intertrochanteric fracture of left femur ([place]) 06/09/2020   Dementia without behavioral disturbance ([place]) 06/09/2020   Stage 3a chronic kidney disease 06/09/2020   Hyperglycemia 06/09/2020   Medicare annual wellness visit, subsequent 08/30/2018   Routine general medical examination at a health care facility 08/30/2018   Advance care planning 08/30/2018   H/O fracture of hip 10/30/2017   Dystrophic nail 04/23/2015   Encounter for Medicare annual wellness exam 04/17/2014   Colon cancer screening 04/17/2014   Anemia 04/17/2014   History of vertebral compression fracture 12/26/2013   Low back pain 11/22/2013   Mycotic toenails  11/04/2011   MCI (mild cognitive impairment) 12/29/2010   Vitamin D deficiency 07/01/2010   FALL, HX OF 07/01/2010   Hyperlipidemia 08/10/2008   Essential hypertension 08/10/2008   ALLERGIC RHINITIS 08/10/2008   GERD 08/10/2008   Osteoporosis 08/10/2008   Past Medical History:  Diagnosis Date   Allergy    Arthritis    Cataract    Dementia (HCC)    Disorder of bone and cartilage, unspecified    GERD (gastroesophageal reflux disease)    Hepatitis    Hyperlipidemia    Hypertension    Thrombocytopenia, unspecified (HCC)    Past Surgical History:  Procedure Laterality Date   CARDIAC CATHETERIZATION     ESOPHAGOGASTRODUODENOSCOPY     INTRAMEDULLARY (IM) NAIL INTERTROCHANTERIC Left  06/09/2020   INTRAMEDULLARY (IM) NAIL INTERTROCHANTERIC Left 06/09/2020   Procedure: INTRAMEDULLARY (IM) NAIL INTERTROCHANTRIC;  Surgeon: Leandrew Koyanagi, MD;  Location: [facility];  Service: Orthopedics;  Laterality: Left;   TOTAL HIP ARTHROPLASTY Right 10/31/2017   Procedure: TOTAL HIP ARTHROPLASTY DIRECT ANTERIOR APPROACH;  Surgeon: Leandrew Koyanagi, MD;  Location: [facility];  Service: Orthopedics;  Laterality: Right;   Social History   Tobacco Use   Smoking status: Never Smoker   Smokeless tobacco: Never Used  Vaping Use   Vaping Use: Never used  Substance Use Topics   Alcohol use: No    Alcohol/week: 0.0 standard drinks   Drug use: No   Family History  Problem Relation Age of Onset   Alzheimer's disease Mother    Allergies  Allergen Reactions   Ace Inhibitors     REACTION: cough   Alendronate Sodium     REACTION: pill got stuck   Current Outpatient Medications on File Prior to Visit  Medication Sig Dispense Refill   acetaminophen (TYLENOL) 325 MG tablet Take 2 tablets (650 mg total) by mouth every 6 (six) hours as needed for mild pain (pain score 1-3 or temp > 100.5). (Patient taking differently: Take 650 mg by mouth every 6 (six) hours as needed for mild pain. Also takes 2 tablets at bedtime) 30 tablet 0   Calcium Carbonate-Vitamin D (CALCIUM-D PO) Take 1 tablet by mouth daily.     Cholecalciferol (VITAMIN D3 PO) Take 1,000 Units daily by mouth.      docusate sodium (COLACE) 100 MG capsule Take 1 capsule (100 mg total) by mouth 2 (two) times daily. 10 capsule 0   donepezil (ARICEPT) 10 MG tablet Take 1 tablet (10 mg total) by mouth at bedtime. 90 tablet 3   melatonin 3 MG TABS tablet Take 1 tablet (3 mg total) by mouth at bedtime as needed (sleep). 20 tablet 0   metoprolol succinate (TOPROL-XL) 50 MG 24 hr tablet Take 1 tablet (50 mg total) by mouth daily. Take with or immediately following a meal. 30 tablet 0   simvastatin (ZOCOR) 20 MG tablet Take 1 tablet (20 mg  total) by mouth at bedtime. 90 tablet 3   alum & mag hydroxide-simeth (MAALOX/MYLANTA) 200-200-20 MG/5ML suspension Take 30 mLs by mouth every 4 (four) hours as needed for indigestion. 355 mL 0   No current facility-administered medications on file prior to visit.     Review of Systems  Constitutional: Positive for fatigue. Negative for activity change, appetite change, fever and unexpected weight change.  HENT: Negative for congestion, ear pain, rhinorrhea, sinus pressure and sore throat.   Eyes: Negative for pain, redness and visual disturbance.  Respiratory: Negative for cough, shortness of breath and wheezing.  Cardiovascular: Negative for chest pain and palpitations.  Gastrointestinal: Negative for abdominal pain, blood in stool, constipation and diarrhea.  Endocrine: Negative for polydipsia and polyuria.  Genitourinary: Negative for dysuria, frequency and urgency.  Musculoskeletal: Positive for arthralgias and back pain. Negative for myalgias.  Skin: Negative for pallor and rash.  Allergic/Immunologic: Negative for environmental allergies.  Neurological: Negative for dizziness, syncope, light-headedness and headaches.       Poor balance Uses walker at all times  Hematological: Negative for adenopathy. Does not bruise/bleed easily.  Psychiatric/Behavioral: Negative for decreased concentration and dysphoric mood. The patient is not nervous/anxious.        Objective:   Physical Exam Constitutional:      General: Amanda Salas is not in acute distress.    Appearance: Normal appearance. Amanda Salas is well-developed and normal weight. Amanda Salas is not ill-appearing.  HENT:     Head: Normocephalic and atraumatic.     Mouth/Throat:     Mouth: Mucous membranes are moist.  Eyes:     General: No scleral icterus.    Conjunctiva/sclera: Conjunctivae normal.     Pupils: Pupils are equal, round, and reactive to light.  Neck:     Thyroid: No thyromegaly.     Vascular: No carotid bruit or JVD.   Cardiovascular:     Rate and Rhythm: Normal rate and regular rhythm.     Pulses: Normal pulses.     Heart sounds: Normal heart sounds. No gallop.   Pulmonary:     Effort: Pulmonary effort is normal. No respiratory distress.     Breath sounds: Normal breath sounds. No wheezing or rales.  Abdominal:     General: Bowel sounds are normal. There is no distension or abdominal bruit.     Palpations: Abdomen is soft. There is no mass.     Tenderness: There is no abdominal tenderness.  Musculoskeletal:     Cervical back: Normal range of motion and neck supple.     Right lower leg: No edema.     Left lower leg: No edema.  Lymphadenopathy:     Cervical: No cervical adenopathy.  Skin:    General: Skin is warm and dry.     Findings: No rash.  Neurological:     Mental Status: Amanda Salas is alert.     Sensory: No sensory deficit.     Coordination: Coordination normal.     Deep Tendon Reflexes: Reflexes are normal and symmetric. Reflexes normal.     Comments: Steady gait with walker and assistance  Psychiatric:        Mood and Affect: Mood normal.           Assessment & Plan:   Problem List Items Addressed This Visit      Cardiovascular and Mediastinum   Essential hypertension    bp remains in good control- is back on losartan 50 after it was held in the hospital for hip fracture  bp in fair control at this time  BP Readings from Last 1 Encounters:  07/15/20 (!) 132/76   No changes needed Most recent labs reviewed  Disc lifstyle change with low sodium diet and exercise         Relevant Medications   losartan (COZAAR) 50 MG tablet   Other Relevant Orders   Basic metabolic panel     Musculoskeletal and Integument   Osteoporosis    Recent hip fracture  Disc fall prev/ D and ca  Once fully healed- we can discuss other opt for tx Has had  a 5 y course of evista       Relevant Medications   Calcium Carbonate-Vitamin D (CALCIUM-D PO)   Closed comminuted intertrochanteric  fracture of left femur (HCC) - Primary    This was surgically repaired Pt doing very well  Has f/u with orthopedics in early august Continues PT/ OT at home (now home from rehab facility)  Reviewed hospital records, lab results and studies in detail  Cbc today (anemic after surgery)         Genitourinary   Stage 3a chronic kidney disease (Chronic)    Improved renal labs after hydration in hospital for hip fracture  Re check today  Family is working to keep her hydrated  Bmp today  bp is well controlled/back on losartan        Other   MCI (mild cognitive impairment)    Family provides care atc at home- making safety a priority Good mental status today        Anemia    Hb 9.6 after hip surgery Not on iron  Did have a transfusion Re check cbc with ferritin today      Relevant Orders   CBC with Differential/Platelet   Ferritin   H/O fracture of hip    Has now fx both hips Recovering well

## 2020-07-15 NOTE — Assessment & Plan Note (Signed)
Recent hip fracture  Disc fall prev/ D and ca  Once fully healed- we can discuss other opt for tx Has had a 5 y course of evista

## 2020-07-15 NOTE — Assessment & Plan Note (Signed)
Family provides care atc at home- making safety a priority Good mental status today

## 2020-07-15 NOTE — Assessment & Plan Note (Signed)
bp remains in good control- is back on losartan 50 after it was held in the hospital for hip fracture  bp in fair control at this time  BP Readings from Last 1 Encounters:  07/15/20 (!) 132/76   No changes needed Most recent labs reviewed  Disc lifstyle change with low sodium diet and exercise

## 2020-07-16 ENCOUNTER — Encounter: Payer: Self-pay | Admitting: *Deleted

## 2020-07-16 DIAGNOSIS — M80052D Age-related osteoporosis with current pathological fracture, left femur, subsequent encounter for fracture with routine healing: Secondary | ICD-10-CM | POA: Diagnosis not present

## 2020-07-16 DIAGNOSIS — D509 Iron deficiency anemia, unspecified: Secondary | ICD-10-CM | POA: Diagnosis not present

## 2020-07-16 DIAGNOSIS — I129 Hypertensive chronic kidney disease with stage 1 through stage 4 chronic kidney disease, or unspecified chronic kidney disease: Secondary | ICD-10-CM | POA: Diagnosis not present

## 2020-07-16 DIAGNOSIS — K219 Gastro-esophageal reflux disease without esophagitis: Secondary | ICD-10-CM | POA: Diagnosis not present

## 2020-07-16 DIAGNOSIS — F0390 Unspecified dementia without behavioral disturbance: Secondary | ICD-10-CM | POA: Diagnosis not present

## 2020-07-16 DIAGNOSIS — N1831 Chronic kidney disease, stage 3a: Secondary | ICD-10-CM | POA: Diagnosis not present

## 2020-07-16 DIAGNOSIS — J309 Allergic rhinitis, unspecified: Secondary | ICD-10-CM | POA: Diagnosis not present

## 2020-07-16 DIAGNOSIS — M545 Low back pain: Secondary | ICD-10-CM | POA: Diagnosis not present

## 2020-07-16 DIAGNOSIS — M1990 Unspecified osteoarthritis, unspecified site: Secondary | ICD-10-CM | POA: Diagnosis not present

## 2020-07-16 LAB — BASIC METABOLIC PANEL
BUN: 25 mg/dL — ABNORMAL HIGH (ref 6–23)
CO2: 26 mEq/L (ref 19–32)
Calcium: 9.4 mg/dL (ref 8.4–10.5)
Chloride: 101 mEq/L (ref 96–112)
Creatinine, Ser: 0.92 mg/dL (ref 0.40–1.20)
GFR: 57.27 mL/min — ABNORMAL LOW (ref >60.00)
Glucose, Bld: 134 mg/dL — ABNORMAL HIGH (ref 70–99)
Potassium: 4.3 mEq/L (ref 3.5–5.1)
Sodium: 134 mEq/L — ABNORMAL LOW (ref 135–145)

## 2020-07-16 LAB — CBC WITH DIFFERENTIAL/PLATELET
Basophils Absolute: 0.1 10*3/uL (ref 0.0–0.1)
Basophils Relative: 0.8 % (ref 0.0–3.0)
Eosinophils Absolute: 0.1 10*3/uL (ref 0.0–0.7)
Eosinophils Relative: 1.5 % (ref 0.0–5.0)
HCT: 38.7 % (ref 36.0–46.0)
Hemoglobin: 12.7 g/dL (ref 12.0–15.0)
Lymphocytes Relative: 23.2 % (ref 12.0–46.0)
Lymphs Abs: 1.7 10*3/uL (ref 0.7–4.0)
MCHC: 32.9 g/dL (ref 30.0–36.0)
MCV: 92 fl (ref 78.0–100.0)
Monocytes Absolute: 0.5 10*3/uL (ref 0.1–1.0)
Monocytes Relative: 7.2 % (ref 3.0–12.0)
Neutro Abs: 4.8 10*3/uL (ref 1.4–7.7)
Neutrophils Relative %: 67.3 % (ref 43.0–77.0)
Platelets: 97 10*3/uL — ABNORMAL LOW (ref 150.0–400.0)
RBC: 4.21 Mil/uL (ref 3.87–5.11)
RDW: 17 % — ABNORMAL HIGH (ref 11.5–15.5)
WBC: 7.2 10*3/uL (ref 4.0–10.5)

## 2020-07-16 LAB — FERRITIN: Ferritin: 265.4 ng/mL (ref 10.0–291.0)

## 2020-07-18 DIAGNOSIS — M80052D Age-related osteoporosis with current pathological fracture, left femur, subsequent encounter for fracture with routine healing: Secondary | ICD-10-CM | POA: Diagnosis not present

## 2020-07-18 DIAGNOSIS — N1831 Chronic kidney disease, stage 3a: Secondary | ICD-10-CM | POA: Diagnosis not present

## 2020-07-18 DIAGNOSIS — F0390 Unspecified dementia without behavioral disturbance: Secondary | ICD-10-CM | POA: Diagnosis not present

## 2020-07-18 DIAGNOSIS — K219 Gastro-esophageal reflux disease without esophagitis: Secondary | ICD-10-CM | POA: Diagnosis not present

## 2020-07-18 DIAGNOSIS — J309 Allergic rhinitis, unspecified: Secondary | ICD-10-CM | POA: Diagnosis not present

## 2020-07-18 DIAGNOSIS — D509 Iron deficiency anemia, unspecified: Secondary | ICD-10-CM | POA: Diagnosis not present

## 2020-07-18 DIAGNOSIS — M1990 Unspecified osteoarthritis, unspecified site: Secondary | ICD-10-CM | POA: Diagnosis not present

## 2020-07-18 DIAGNOSIS — M545 Low back pain: Secondary | ICD-10-CM | POA: Diagnosis not present

## 2020-07-18 DIAGNOSIS — I129 Hypertensive chronic kidney disease with stage 1 through stage 4 chronic kidney disease, or unspecified chronic kidney disease: Secondary | ICD-10-CM | POA: Diagnosis not present

## 2020-07-23 ENCOUNTER — Ambulatory Visit (INDEPENDENT_AMBULATORY_CARE_PROVIDER_SITE_OTHER): Payer: Medicare HMO

## 2020-07-23 ENCOUNTER — Ambulatory Visit (INDEPENDENT_AMBULATORY_CARE_PROVIDER_SITE_OTHER): Payer: Medicare HMO | Admitting: Orthopaedic Surgery

## 2020-07-23 ENCOUNTER — Encounter: Payer: Self-pay | Admitting: Orthopaedic Surgery

## 2020-07-23 DIAGNOSIS — M79605 Pain in left leg: Secondary | ICD-10-CM | POA: Diagnosis not present

## 2020-07-23 DIAGNOSIS — J309 Allergic rhinitis, unspecified: Secondary | ICD-10-CM | POA: Diagnosis not present

## 2020-07-23 DIAGNOSIS — D509 Iron deficiency anemia, unspecified: Secondary | ICD-10-CM | POA: Diagnosis not present

## 2020-07-23 DIAGNOSIS — K219 Gastro-esophageal reflux disease without esophagitis: Secondary | ICD-10-CM | POA: Diagnosis not present

## 2020-07-23 DIAGNOSIS — I129 Hypertensive chronic kidney disease with stage 1 through stage 4 chronic kidney disease, or unspecified chronic kidney disease: Secondary | ICD-10-CM | POA: Diagnosis not present

## 2020-07-23 DIAGNOSIS — N1831 Chronic kidney disease, stage 3a: Secondary | ICD-10-CM | POA: Diagnosis not present

## 2020-07-23 DIAGNOSIS — M80052D Age-related osteoporosis with current pathological fracture, left femur, subsequent encounter for fracture with routine healing: Secondary | ICD-10-CM | POA: Diagnosis not present

## 2020-07-23 DIAGNOSIS — M1990 Unspecified osteoarthritis, unspecified site: Secondary | ICD-10-CM | POA: Diagnosis not present

## 2020-07-23 DIAGNOSIS — M545 Low back pain: Secondary | ICD-10-CM | POA: Diagnosis not present

## 2020-07-23 DIAGNOSIS — F0390 Unspecified dementia without behavioral disturbance: Secondary | ICD-10-CM | POA: Diagnosis not present

## 2020-07-23 NOTE — Progress Notes (Signed)
Post-Op Visit Note   Patient: Amanda Salas           Date of Birth: 1927-11-16           MRN: [id number] Visit Date: 07/23/2020 PCP: Abner Greenspan, MD   Assessment & Plan:  Chief Complaint:  Chief Complaint  Patient presents with  . Left Hip - Routine Post Op   Visit Diagnoses:  1. Pain in left leg     Plan: Patient is 6 weeks status post IM nail left subtrochanteric femur fracture.  Overall she is doing well.  No real complaints.  Surgical scars are fully healed.  She is currently at home getting home health PT.  She has no pain with ambulation with a walker.  X-rays demonstrate continued fracture consolidation.  Recheck in 6 weeks with two-view x-rays of the left hip.  Follow-Up Instructions: Return in about 6 weeks (around 09/03/2020).   Orders:  Orders Placed This Encounter  Procedures  . XR HIP UNILAT W OR W/O PELVIS 2-3 VIEWS LEFT   No orders of the defined types were placed in this encounter.   Imaging: XR HIP UNILAT W OR W/O PELVIS 2-3 VIEWS LEFT  Result Date: 07/23/2020 Stable fixation of subtrochanteric fracture.  There is evidence of continued bony consolidation.   PMFS History: Patient Active Problem List   Diagnosis Date Noted  . Closed comminuted intertrochanteric fracture of left femur ([place]) 06/09/2020  . Dementia without behavioral disturbance ([place]) 06/09/2020  . Stage 3a chronic kidney disease 06/09/2020  . Hyperglycemia 06/09/2020  . Medicare annual wellness visit, subsequent 08/30/2018  . Routine general medical examination at a health care facility 08/30/2018  . Advance care planning 08/30/2018  . H/O fracture of hip 10/30/2017  . Dystrophic nail 04/23/2015  . Encounter for Medicare annual wellness exam 04/17/2014  . Colon cancer screening 04/17/2014  . Anemia 04/17/2014  . History of vertebral compression fracture 12/26/2013  . Low back pain 11/22/2013  . 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 MCI (mild cognitive impairment) 12/29/2010  .  Vitamin D deficiency 07/01/2010  . FALL, HX OF 07/01/2010  . Hyperlipidemia 08/10/2008  . Essential hypertension 08/10/2008  . ALLERGIC RHINITIS 08/10/2008  . GERD 08/10/2008  . Osteoporosis 08/10/2008   Past Medical History:  Diagnosis Date  . Allergy   . Arthritis   . Cataract   . Dementia ([place])   . Disorder of bone and cartilage, unspecified   . GERD (gastroesophageal reflux disease)   . Hepatitis   . Hyperlipidemia   . Hypertension   . Thrombocytopenia, unspecified ([place])     Family History  Problem Relation Age of Onset  . Alzheimer's disease Mother     Past Surgical History:  Procedure Laterality Date  . CARDIAC CATHETERIZATION    . ESOPHAGOGASTRODUODENOSCOPY    . INTRAMEDULLARY (IM) NAIL INTERTROCHANTERIC Left 06/09/2020  . INTRAMEDULLARY (IM) NAIL INTERTROCHANTERIC Left 06/09/2020   Procedure: INTRAMEDULLARY (IM) NAIL INTERTROCHANTRIC;  Surgeon: Leandrew Koyanagi, MD;  Location: [facility];  Service: Orthopedics;  Laterality: Left;  . TOTAL HIP ARTHROPLASTY Right 10/31/2017   Procedure: TOTAL HIP ARTHROPLASTY DIRECT ANTERIOR APPROACH;  Surgeon: Leandrew Koyanagi, MD;  Location: [facility];  Service: Orthopedics;  Laterality: Right;   Social History   Occupational History  . Occupation: retired  Tobacco Use  . Smoking status: Never Smoker  . Smokeless tobacco: Never Used  Vaping Use  . Vaping Use: Never used  Substance and Sexual Activity  . Alcohol use: No  Alcohol/week: 0.0 standard drinks  . Drug use: No  . Sexual activity: Not Currently

## 2020-07-25 DIAGNOSIS — M545 Low back pain: Secondary | ICD-10-CM | POA: Diagnosis not present

## 2020-07-25 DIAGNOSIS — J309 Allergic rhinitis, unspecified: Secondary | ICD-10-CM | POA: Diagnosis not present

## 2020-07-25 DIAGNOSIS — D509 Iron deficiency anemia, unspecified: Secondary | ICD-10-CM | POA: Diagnosis not present

## 2020-07-25 DIAGNOSIS — M80052D Age-related osteoporosis with current pathological fracture, left femur, subsequent encounter for fracture with routine healing: Secondary | ICD-10-CM | POA: Diagnosis not present

## 2020-07-25 DIAGNOSIS — N1831 Chronic kidney disease, stage 3a: Secondary | ICD-10-CM | POA: Diagnosis not present

## 2020-07-25 DIAGNOSIS — I129 Hypertensive chronic kidney disease with stage 1 through stage 4 chronic kidney disease, or unspecified chronic kidney disease: Secondary | ICD-10-CM | POA: Diagnosis not present

## 2020-07-25 DIAGNOSIS — M1990 Unspecified osteoarthritis, unspecified site: Secondary | ICD-10-CM | POA: Diagnosis not present

## 2020-07-25 DIAGNOSIS — K219 Gastro-esophageal reflux disease without esophagitis: Secondary | ICD-10-CM | POA: Diagnosis not present

## 2020-07-25 DIAGNOSIS — F0390 Unspecified dementia without behavioral disturbance: Secondary | ICD-10-CM | POA: Diagnosis not present

## 2020-07-29 DIAGNOSIS — M545 Low back pain: Secondary | ICD-10-CM | POA: Diagnosis not present

## 2020-07-29 DIAGNOSIS — F0390 Unspecified dementia without behavioral disturbance: Secondary | ICD-10-CM | POA: Diagnosis not present

## 2020-07-29 DIAGNOSIS — M1990 Unspecified osteoarthritis, unspecified site: Secondary | ICD-10-CM | POA: Diagnosis not present

## 2020-07-29 DIAGNOSIS — D509 Iron deficiency anemia, unspecified: Secondary | ICD-10-CM | POA: Diagnosis not present

## 2020-07-29 DIAGNOSIS — N1831 Chronic kidney disease, stage 3a: Secondary | ICD-10-CM | POA: Diagnosis not present

## 2020-07-29 DIAGNOSIS — I129 Hypertensive chronic kidney disease with stage 1 through stage 4 chronic kidney disease, or unspecified chronic kidney disease: Secondary | ICD-10-CM | POA: Diagnosis not present

## 2020-07-29 DIAGNOSIS — K219 Gastro-esophageal reflux disease without esophagitis: Secondary | ICD-10-CM | POA: Diagnosis not present

## 2020-07-29 DIAGNOSIS — M80052D Age-related osteoporosis with current pathological fracture, left femur, subsequent encounter for fracture with routine healing: Secondary | ICD-10-CM | POA: Diagnosis not present

## 2020-07-29 DIAGNOSIS — J309 Allergic rhinitis, unspecified: Secondary | ICD-10-CM | POA: Diagnosis not present

## 2020-07-30 DIAGNOSIS — F0390 Unspecified dementia without behavioral disturbance: Secondary | ICD-10-CM | POA: Diagnosis not present

## 2020-07-30 DIAGNOSIS — Z96641 Presence of right artificial hip joint: Secondary | ICD-10-CM

## 2020-07-30 DIAGNOSIS — Z9181 History of falling: Secondary | ICD-10-CM

## 2020-07-30 DIAGNOSIS — E785 Hyperlipidemia, unspecified: Secondary | ICD-10-CM

## 2020-07-30 DIAGNOSIS — K219 Gastro-esophageal reflux disease without esophagitis: Secondary | ICD-10-CM | POA: Diagnosis not present

## 2020-07-30 DIAGNOSIS — J309 Allergic rhinitis, unspecified: Secondary | ICD-10-CM | POA: Diagnosis not present

## 2020-07-30 DIAGNOSIS — M80052D Age-related osteoporosis with current pathological fracture, left femur, subsequent encounter for fracture with routine healing: Secondary | ICD-10-CM | POA: Diagnosis not present

## 2020-07-30 DIAGNOSIS — I129 Hypertensive chronic kidney disease with stage 1 through stage 4 chronic kidney disease, or unspecified chronic kidney disease: Secondary | ICD-10-CM | POA: Diagnosis not present

## 2020-07-30 DIAGNOSIS — N1831 Chronic kidney disease, stage 3a: Secondary | ICD-10-CM | POA: Diagnosis not present

## 2020-07-30 DIAGNOSIS — Z8619 Personal history of other infectious and parasitic diseases: Secondary | ICD-10-CM

## 2020-07-30 DIAGNOSIS — H269 Unspecified cataract: Secondary | ICD-10-CM

## 2020-07-30 DIAGNOSIS — R739 Hyperglycemia, unspecified: Secondary | ICD-10-CM

## 2020-07-30 DIAGNOSIS — M545 Low back pain: Secondary | ICD-10-CM | POA: Diagnosis not present

## 2020-07-30 DIAGNOSIS — M1990 Unspecified osteoarthritis, unspecified site: Secondary | ICD-10-CM | POA: Diagnosis not present

## 2020-07-30 DIAGNOSIS — D509 Iron deficiency anemia, unspecified: Secondary | ICD-10-CM | POA: Diagnosis not present

## 2020-07-31 DIAGNOSIS — M80052D Age-related osteoporosis with current pathological fracture, left femur, subsequent encounter for fracture with routine healing: Secondary | ICD-10-CM | POA: Diagnosis not present

## 2020-07-31 DIAGNOSIS — D509 Iron deficiency anemia, unspecified: Secondary | ICD-10-CM | POA: Diagnosis not present

## 2020-07-31 DIAGNOSIS — I129 Hypertensive chronic kidney disease with stage 1 through stage 4 chronic kidney disease, or unspecified chronic kidney disease: Secondary | ICD-10-CM | POA: Diagnosis not present

## 2020-07-31 DIAGNOSIS — N1831 Chronic kidney disease, stage 3a: Secondary | ICD-10-CM | POA: Diagnosis not present

## 2020-07-31 DIAGNOSIS — F0390 Unspecified dementia without behavioral disturbance: Secondary | ICD-10-CM | POA: Diagnosis not present

## 2020-07-31 DIAGNOSIS — K219 Gastro-esophageal reflux disease without esophagitis: Secondary | ICD-10-CM | POA: Diagnosis not present

## 2020-07-31 DIAGNOSIS — M1990 Unspecified osteoarthritis, unspecified site: Secondary | ICD-10-CM | POA: Diagnosis not present

## 2020-07-31 DIAGNOSIS — J309 Allergic rhinitis, unspecified: Secondary | ICD-10-CM | POA: Diagnosis not present

## 2020-07-31 DIAGNOSIS — M545 Low back pain: Secondary | ICD-10-CM | POA: Diagnosis not present

## 2020-08-02 ENCOUNTER — Telehealth: Payer: Self-pay

## 2020-08-02 NOTE — Telephone Encounter (Signed)
I hope they can use the last visit for the face to face.  Can verbally ok the order and I will see it when I return on monday

## 2020-08-02 NOTE — Telephone Encounter (Signed)
Amanda Salas with [facility] called just wanted to let you guys know that she was sending a fax for a referral for pt to have an assessment with [facility] for a PCA to help with patient's needs... You saw pt 07/15/2020 for rehab f/u... Does pt need another face to face?

## 2020-08-05 DIAGNOSIS — J309 Allergic rhinitis, unspecified: Secondary | ICD-10-CM | POA: Diagnosis not present

## 2020-08-05 DIAGNOSIS — M545 Low back pain: Secondary | ICD-10-CM | POA: Diagnosis not present

## 2020-08-05 DIAGNOSIS — M1990 Unspecified osteoarthritis, unspecified site: Secondary | ICD-10-CM | POA: Diagnosis not present

## 2020-08-05 DIAGNOSIS — M80052D Age-related osteoporosis with current pathological fracture, left femur, subsequent encounter for fracture with routine healing: Secondary | ICD-10-CM | POA: Diagnosis not present

## 2020-08-05 DIAGNOSIS — F0390 Unspecified dementia without behavioral disturbance: Secondary | ICD-10-CM | POA: Diagnosis not present

## 2020-08-05 DIAGNOSIS — N1831 Chronic kidney disease, stage 3a: Secondary | ICD-10-CM | POA: Diagnosis not present

## 2020-08-05 DIAGNOSIS — K219 Gastro-esophageal reflux disease without esophagitis: Secondary | ICD-10-CM | POA: Diagnosis not present

## 2020-08-05 DIAGNOSIS — I129 Hypertensive chronic kidney disease with stage 1 through stage 4 chronic kidney disease, or unspecified chronic kidney disease: Secondary | ICD-10-CM | POA: Diagnosis not present

## 2020-08-05 DIAGNOSIS — D509 Iron deficiency anemia, unspecified: Secondary | ICD-10-CM | POA: Diagnosis not present

## 2020-08-07 DIAGNOSIS — M80052D Age-related osteoporosis with current pathological fracture, left femur, subsequent encounter for fracture with routine healing: Secondary | ICD-10-CM | POA: Diagnosis not present

## 2020-08-07 DIAGNOSIS — I129 Hypertensive chronic kidney disease with stage 1 through stage 4 chronic kidney disease, or unspecified chronic kidney disease: Secondary | ICD-10-CM | POA: Diagnosis not present

## 2020-08-07 DIAGNOSIS — M545 Low back pain: Secondary | ICD-10-CM | POA: Diagnosis not present

## 2020-08-07 DIAGNOSIS — F0390 Unspecified dementia without behavioral disturbance: Secondary | ICD-10-CM | POA: Diagnosis not present

## 2020-08-07 DIAGNOSIS — N1831 Chronic kidney disease, stage 3a: Secondary | ICD-10-CM | POA: Diagnosis not present

## 2020-08-07 DIAGNOSIS — J309 Allergic rhinitis, unspecified: Secondary | ICD-10-CM | POA: Diagnosis not present

## 2020-08-07 DIAGNOSIS — K219 Gastro-esophageal reflux disease without esophagitis: Secondary | ICD-10-CM | POA: Diagnosis not present

## 2020-08-07 DIAGNOSIS — D509 Iron deficiency anemia, unspecified: Secondary | ICD-10-CM | POA: Diagnosis not present

## 2020-08-07 DIAGNOSIS — M1990 Unspecified osteoarthritis, unspecified site: Secondary | ICD-10-CM | POA: Diagnosis not present

## 2020-08-11 DIAGNOSIS — I129 Hypertensive chronic kidney disease with stage 1 through stage 4 chronic kidney disease, or unspecified chronic kidney disease: Secondary | ICD-10-CM | POA: Diagnosis not present

## 2020-08-11 DIAGNOSIS — F0390 Unspecified dementia without behavioral disturbance: Secondary | ICD-10-CM | POA: Diagnosis not present

## 2020-08-11 DIAGNOSIS — M1990 Unspecified osteoarthritis, unspecified site: Secondary | ICD-10-CM | POA: Diagnosis not present

## 2020-08-11 DIAGNOSIS — D509 Iron deficiency anemia, unspecified: Secondary | ICD-10-CM | POA: Diagnosis not present

## 2020-08-11 DIAGNOSIS — N1831 Chronic kidney disease, stage 3a: Secondary | ICD-10-CM | POA: Diagnosis not present

## 2020-08-11 DIAGNOSIS — J309 Allergic rhinitis, unspecified: Secondary | ICD-10-CM | POA: Diagnosis not present

## 2020-08-11 DIAGNOSIS — M545 Low back pain: Secondary | ICD-10-CM | POA: Diagnosis not present

## 2020-08-11 DIAGNOSIS — K219 Gastro-esophageal reflux disease without esophagitis: Secondary | ICD-10-CM | POA: Diagnosis not present

## 2020-08-11 DIAGNOSIS — M80052D Age-related osteoporosis with current pathological fracture, left femur, subsequent encounter for fracture with routine healing: Secondary | ICD-10-CM | POA: Diagnosis not present

## 2020-08-12 DIAGNOSIS — M545 Low back pain: Secondary | ICD-10-CM | POA: Diagnosis not present

## 2020-08-12 DIAGNOSIS — F0390 Unspecified dementia without behavioral disturbance: Secondary | ICD-10-CM | POA: Diagnosis not present

## 2020-08-12 DIAGNOSIS — I129 Hypertensive chronic kidney disease with stage 1 through stage 4 chronic kidney disease, or unspecified chronic kidney disease: Secondary | ICD-10-CM | POA: Diagnosis not present

## 2020-08-12 DIAGNOSIS — N1831 Chronic kidney disease, stage 3a: Secondary | ICD-10-CM | POA: Diagnosis not present

## 2020-08-12 DIAGNOSIS — D509 Iron deficiency anemia, unspecified: Secondary | ICD-10-CM | POA: Diagnosis not present

## 2020-08-12 DIAGNOSIS — J309 Allergic rhinitis, unspecified: Secondary | ICD-10-CM | POA: Diagnosis not present

## 2020-08-12 DIAGNOSIS — M1990 Unspecified osteoarthritis, unspecified site: Secondary | ICD-10-CM | POA: Diagnosis not present

## 2020-08-12 DIAGNOSIS — K219 Gastro-esophageal reflux disease without esophagitis: Secondary | ICD-10-CM | POA: Diagnosis not present

## 2020-08-12 DIAGNOSIS — M80052D Age-related osteoporosis with current pathological fracture, left femur, subsequent encounter for fracture with routine healing: Secondary | ICD-10-CM | POA: Diagnosis not present

## 2020-08-19 DIAGNOSIS — I129 Hypertensive chronic kidney disease with stage 1 through stage 4 chronic kidney disease, or unspecified chronic kidney disease: Secondary | ICD-10-CM | POA: Diagnosis not present

## 2020-08-19 DIAGNOSIS — N1831 Chronic kidney disease, stage 3a: Secondary | ICD-10-CM | POA: Diagnosis not present

## 2020-08-19 DIAGNOSIS — M80052D Age-related osteoporosis with current pathological fracture, left femur, subsequent encounter for fracture with routine healing: Secondary | ICD-10-CM | POA: Diagnosis not present

## 2020-08-19 DIAGNOSIS — K219 Gastro-esophageal reflux disease without esophagitis: Secondary | ICD-10-CM | POA: Diagnosis not present

## 2020-08-19 DIAGNOSIS — M545 Low back pain: Secondary | ICD-10-CM | POA: Diagnosis not present

## 2020-08-19 DIAGNOSIS — J309 Allergic rhinitis, unspecified: Secondary | ICD-10-CM | POA: Diagnosis not present

## 2020-08-19 DIAGNOSIS — F0390 Unspecified dementia without behavioral disturbance: Secondary | ICD-10-CM | POA: Diagnosis not present

## 2020-08-19 DIAGNOSIS — M1990 Unspecified osteoarthritis, unspecified site: Secondary | ICD-10-CM | POA: Diagnosis not present

## 2020-08-19 DIAGNOSIS — D509 Iron deficiency anemia, unspecified: Secondary | ICD-10-CM | POA: Diagnosis not present

## 2020-08-22 ENCOUNTER — Telehealth: Payer: Self-pay | Admitting: *Deleted

## 2020-08-22 NOTE — Telephone Encounter (Signed)
Received fax and placed in Dr. Marliss Coots inbox for review

## 2020-08-22 NOTE — Telephone Encounter (Signed)
Amanda Salas with [facility] care received a referral on pt to start personal services but the diagnosis code for "Vascular dementia without behavioral disturbance ([place])" was incorrect/invalid. I did advise her the the diagnosis code for that issues is F01.50 in our system. Amanda Salas said that's not the diagnosis code on the form. Amanda Salas is going to fax a form just stating that the F01.50 is the correct diagnosis code for that issue, she said PCP just needs to sign form and fax it back. I advise them that PCP out of the office and they said it can wait until tomorrow   FYI to PCP

## 2020-08-23 NOTE — Telephone Encounter (Signed)
Done and in IN box 

## 2020-08-23 NOTE — Telephone Encounter (Signed)
Form faxed

## 2020-08-28 DIAGNOSIS — J309 Allergic rhinitis, unspecified: Secondary | ICD-10-CM | POA: Diagnosis not present

## 2020-08-28 DIAGNOSIS — K219 Gastro-esophageal reflux disease without esophagitis: Secondary | ICD-10-CM | POA: Diagnosis not present

## 2020-08-28 DIAGNOSIS — F0390 Unspecified dementia without behavioral disturbance: Secondary | ICD-10-CM | POA: Diagnosis not present

## 2020-08-28 DIAGNOSIS — M80052D Age-related osteoporosis with current pathological fracture, left femur, subsequent encounter for fracture with routine healing: Secondary | ICD-10-CM | POA: Diagnosis not present

## 2020-08-28 DIAGNOSIS — N1831 Chronic kidney disease, stage 3a: Secondary | ICD-10-CM | POA: Diagnosis not present

## 2020-08-28 DIAGNOSIS — M1990 Unspecified osteoarthritis, unspecified site: Secondary | ICD-10-CM | POA: Diagnosis not present

## 2020-08-28 DIAGNOSIS — D509 Iron deficiency anemia, unspecified: Secondary | ICD-10-CM | POA: Diagnosis not present

## 2020-08-28 DIAGNOSIS — I129 Hypertensive chronic kidney disease with stage 1 through stage 4 chronic kidney disease, or unspecified chronic kidney disease: Secondary | ICD-10-CM | POA: Diagnosis not present

## 2020-08-28 DIAGNOSIS — M545 Low back pain: Secondary | ICD-10-CM | POA: Diagnosis not present

## 2020-09-03 ENCOUNTER — Ambulatory Visit (INDEPENDENT_AMBULATORY_CARE_PROVIDER_SITE_OTHER): Payer: Medicare HMO

## 2020-09-03 ENCOUNTER — Encounter: Payer: Self-pay | Admitting: Orthopaedic Surgery

## 2020-09-03 ENCOUNTER — Ambulatory Visit (INDEPENDENT_AMBULATORY_CARE_PROVIDER_SITE_OTHER): Payer: Medicare HMO | Admitting: Orthopaedic Surgery

## 2020-09-03 DIAGNOSIS — M79605 Pain in left leg: Secondary | ICD-10-CM

## 2020-09-03 NOTE — Progress Notes (Signed)
Post-Op Visit Note   Patient: Amanda Salas           Date of Birth: 04-Mar-1927           MRN: [id number] Visit Date: 09/03/2020 PCP: Abner Greenspan, MD   Assessment & Plan:  Chief Complaint:  Chief Complaint  Patient presents with  . Left Leg - Routine Post Op   Visit Diagnoses:  1. Pain in left leg     Plan: Patient is a very pleasant 86 year old female who comes in today with her daughter.  She is approximately 3 months status post left hip femoral nail from a subtrochanteric femur fracture 06/09/2020.  She has been doing well.  She has been getting therapy once a week and is ambulating with a walker.  She denies any specific pain to her left hip.  Examination of the left hip reveals full range of motion and near full strength.  She is neurovascular intact distally.  At this point, I believe her fracture has healed based on consolidation radiographs today.  she will continue to advance with activity as tolerated.  Follow-up with [place] as needed.  Follow-Up Instructions: Return if symptoms worsen or fail to improve.   Orders:  Orders Placed This Encounter  Procedures  . XR HIP UNILAT W OR W/O PELVIS 2-3 VIEWS LEFT   No orders of the defined types were placed in this encounter.   Imaging: XR HIP UNILAT W OR W/O PELVIS 2-3 VIEWS LEFT  Result Date: 09/03/2020 X-rays reveal bony consolidation   PMFS History: Patient Active Problem List   Diagnosis Date Noted  . Closed comminuted intertrochanteric fracture of left femur ([place]) 06/09/2020  . Dementia without behavioral disturbance ([place]) 06/09/2020  . Stage 3a chronic kidney disease 06/09/2020  . Hyperglycemia 06/09/2020  . Medicare annual wellness visit, subsequent 08/30/2018  . Routine general medical examination at a health care facility 08/30/2018  . Advance care planning 08/30/2018  . H/O fracture of hip 10/30/2017  . Dystrophic nail 04/23/2015  . Encounter for Medicare annual wellness exam 04/17/2014  . Colon  cancer screening 04/17/2014  . Anemia 04/17/2014  . History of vertebral compression fracture 12/26/2013  . Low back pain 11/22/2013  . 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 MCI (mild cognitive impairment) 12/29/2010  . Vitamin D deficiency 07/01/2010  . FALL, HX OF 07/01/2010  . Hyperlipidemia 08/10/2008  . Essential hypertension 08/10/2008  . ALLERGIC RHINITIS 08/10/2008  . GERD 08/10/2008  . Osteoporosis 08/10/2008   Past Medical History:  Diagnosis Date  . Allergy   . Arthritis   . Cataract   . Dementia ([place])   . Disorder of bone and cartilage, unspecified   . GERD (gastroesophageal reflux disease)   . Hepatitis   . Hyperlipidemia   . Hypertension   . Thrombocytopenia, unspecified ([place])     Family History  Problem Relation Age of Onset  . Alzheimer's disease Mother     Past Surgical History:  Procedure Laterality Date  . CARDIAC CATHETERIZATION    . ESOPHAGOGASTRODUODENOSCOPY    . INTRAMEDULLARY (IM) NAIL INTERTROCHANTERIC Left 06/09/2020  . INTRAMEDULLARY (IM) NAIL INTERTROCHANTERIC Left 06/09/2020   Procedure: INTRAMEDULLARY (IM) NAIL INTERTROCHANTRIC;  Surgeon: Leandrew Koyanagi, MD;  Location: [facility];  Service: Orthopedics;  Laterality: Left;  . TOTAL HIP ARTHROPLASTY Right 10/31/2017   Procedure: TOTAL HIP ARTHROPLASTY DIRECT ANTERIOR APPROACH;  Surgeon: Leandrew Koyanagi, MD;  Location: [facility];  Service: Orthopedics;  Laterality: Right;   Social History  Occupational History  . Occupation: retired  Tobacco Use  . Smoking status: Never Smoker  . Smokeless tobacco: Never Used  Vaping Use  . Vaping Use: Never used  Substance and Sexual Activity  . Alcohol use: No    Alcohol/week: 0.0 standard drinks  . Drug use: No  . Sexual activity: Not Currently

## 2020-09-04 DIAGNOSIS — I129 Hypertensive chronic kidney disease with stage 1 through stage 4 chronic kidney disease, or unspecified chronic kidney disease: Secondary | ICD-10-CM | POA: Diagnosis not present

## 2020-09-04 DIAGNOSIS — D509 Iron deficiency anemia, unspecified: Secondary | ICD-10-CM | POA: Diagnosis not present

## 2020-09-04 DIAGNOSIS — M545 Low back pain: Secondary | ICD-10-CM | POA: Diagnosis not present

## 2020-09-04 DIAGNOSIS — J309 Allergic rhinitis, unspecified: Secondary | ICD-10-CM | POA: Diagnosis not present

## 2020-09-04 DIAGNOSIS — N1831 Chronic kidney disease, stage 3a: Secondary | ICD-10-CM | POA: Diagnosis not present

## 2020-09-04 DIAGNOSIS — M1990 Unspecified osteoarthritis, unspecified site: Secondary | ICD-10-CM | POA: Diagnosis not present

## 2020-09-04 DIAGNOSIS — K219 Gastro-esophageal reflux disease without esophagitis: Secondary | ICD-10-CM | POA: Diagnosis not present

## 2020-09-04 DIAGNOSIS — M80052D Age-related osteoporosis with current pathological fracture, left femur, subsequent encounter for fracture with routine healing: Secondary | ICD-10-CM | POA: Diagnosis not present

## 2020-09-04 DIAGNOSIS — F0390 Unspecified dementia without behavioral disturbance: Secondary | ICD-10-CM | POA: Diagnosis not present

## 2020-09-18 ENCOUNTER — Telehealth: Payer: Self-pay

## 2020-09-18 NOTE — Telephone Encounter (Signed)
I will review when I get back in the office

## 2020-09-18 NOTE — Telephone Encounter (Signed)
Form placed in your inbox for review when you return. Last item on the form ask for a med list, dx list and specialized treatments. I printed out a copy of med list and part of the snap shot for the problem list. Not sure what "spcialized treatment" they referring to so will let PCP review that part

## 2020-09-18 NOTE — Telephone Encounter (Signed)
Patient grand daughter dropped off home health care forms that need to be filled out by the provider. Forms are on the cart in the front. [phone]

## 2020-09-23 NOTE — Telephone Encounter (Signed)
Pt's daughter is following up on paperwork that was dropped off. She would like back.

## 2020-09-23 NOTE — Telephone Encounter (Signed)
Form faxed and granddaughter notified forms ready for pick up  Copy also sent to scanning

## 2020-10-30 ENCOUNTER — Other Ambulatory Visit: Payer: Self-pay | Admitting: Family Medicine

## 2020-11-07 ENCOUNTER — Other Ambulatory Visit: Payer: Self-pay | Admitting: Family Medicine

## 2020-11-28 ENCOUNTER — Other Ambulatory Visit: Payer: Self-pay | Admitting: Family Medicine

## 2021-02-19 ENCOUNTER — Other Ambulatory Visit: Payer: Self-pay | Admitting: Family Medicine

## 2021-04-26 IMAGING — CT CT HEAD W/O CM
4 series · 15 of 47 positions shown, 17 images · non-contrast
Comparison: None.

CLINICAL DATA: LEFT hip fracture.  Fall.

EXAM:
CT HEAD WITHOUT CONTRAST
TECHNIQUE: Contiguous axial images were obtained from the base of the skull
through the vertex without intravenous contrast.

[Series 3: head without · axial · non-contrast · 0.44mm/px · z∈[-75,+50]mm · 7 of 35 slices shown, 9 images]
[im 5/35  brain]
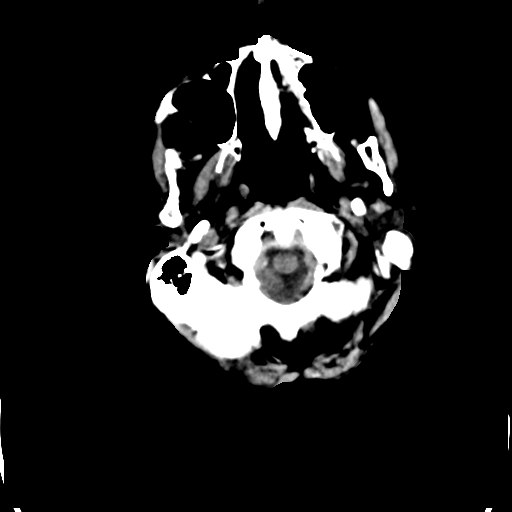
[im 5/35  bone]
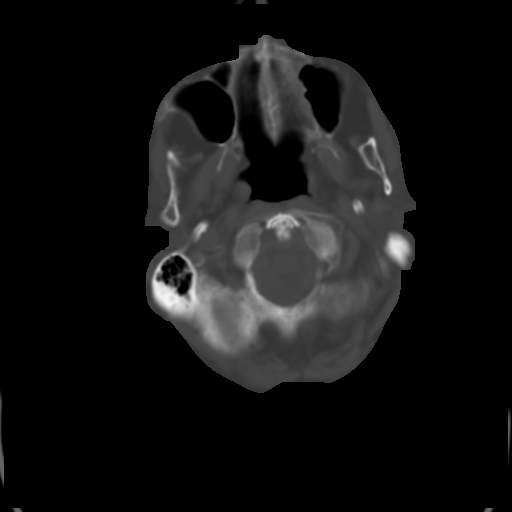
[im 9/35  brain]
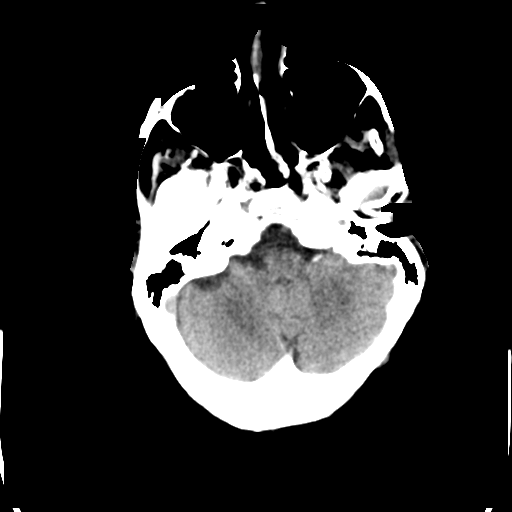
[im 13/35  brain]
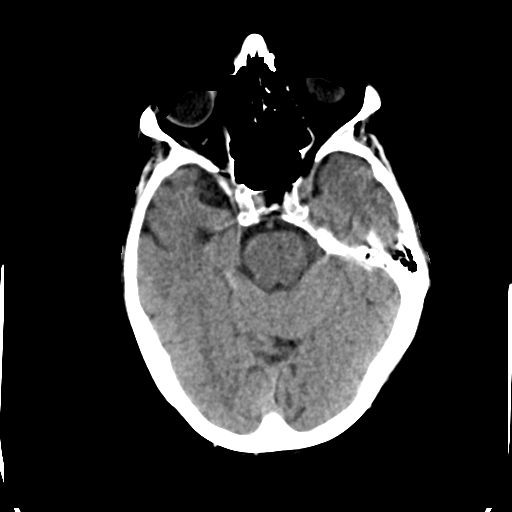
[im 18/35  brain]
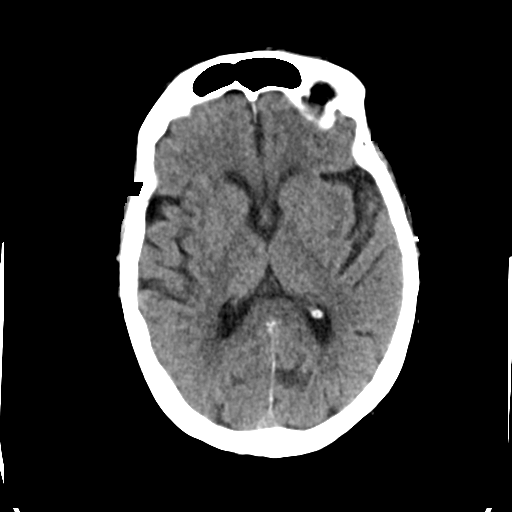
[im 22/35  brain]
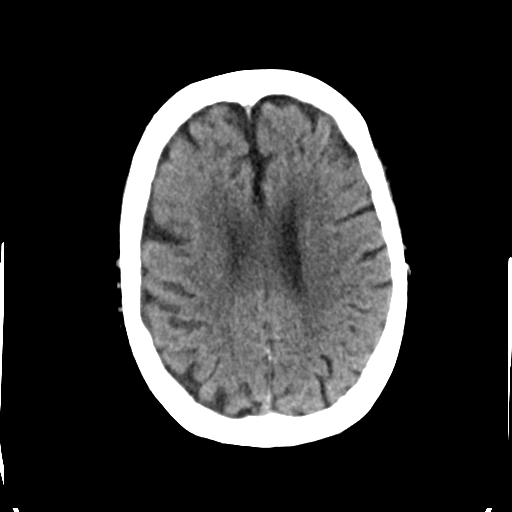
[im 22/35  bone]
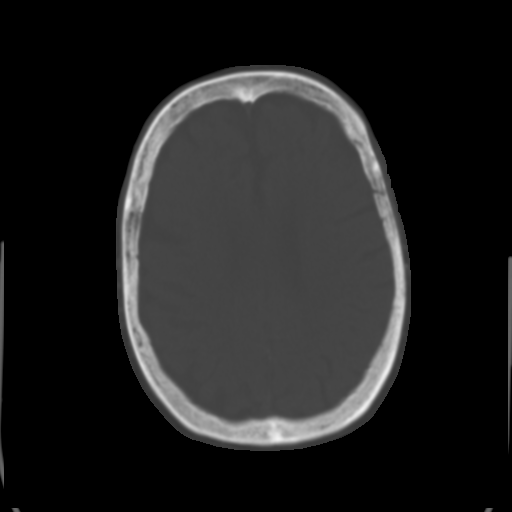
[im 26/35  brain]
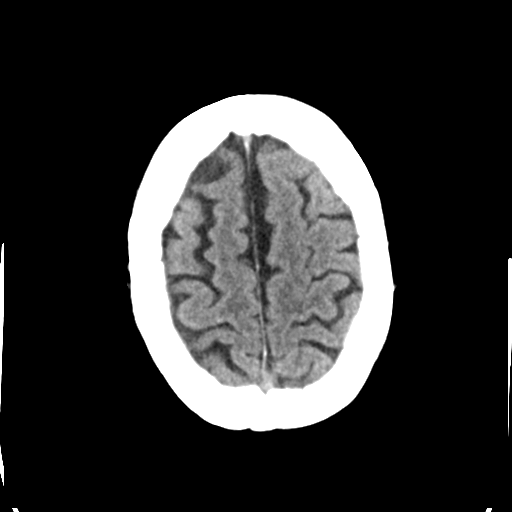
[im 30/35  brain]
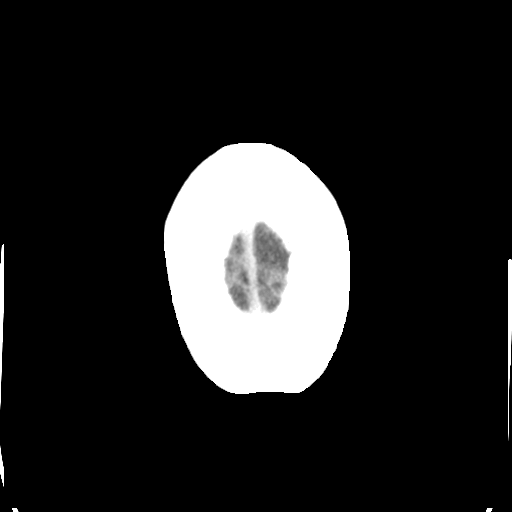

[Series 4: head bone · axial · 0.44mm/px · z∈[-79,-61]mm · 2 of 87 slices shown]
[im 9/87  bone]
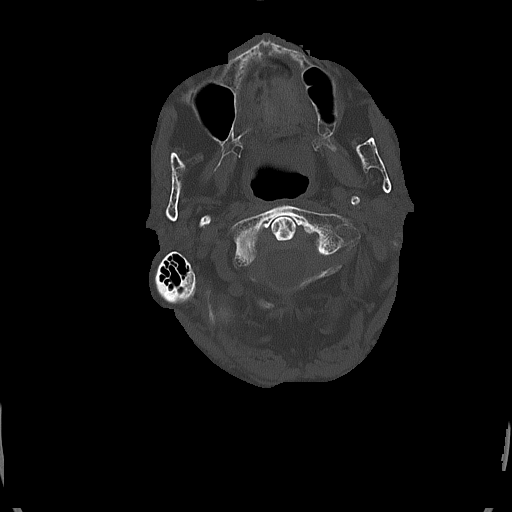
[im 18/87  bone]
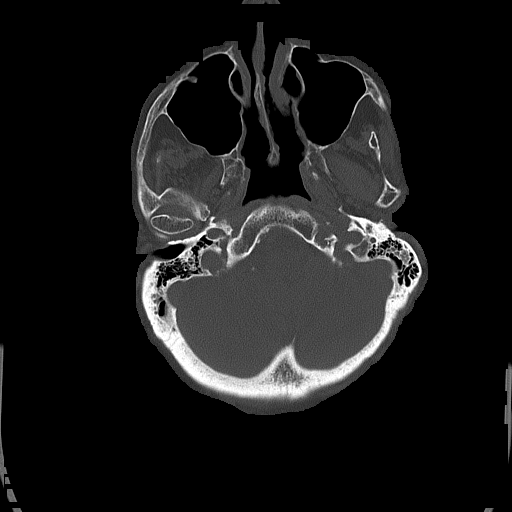

[Series 5: head without cor · coronal · non-contrast · 0.34mm/px · 3 of 79 slices shown]
[im 27/79  brain]
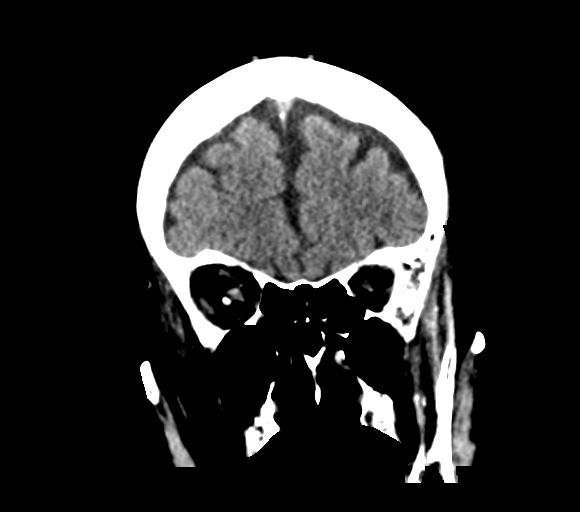
[im 35/79  brain]
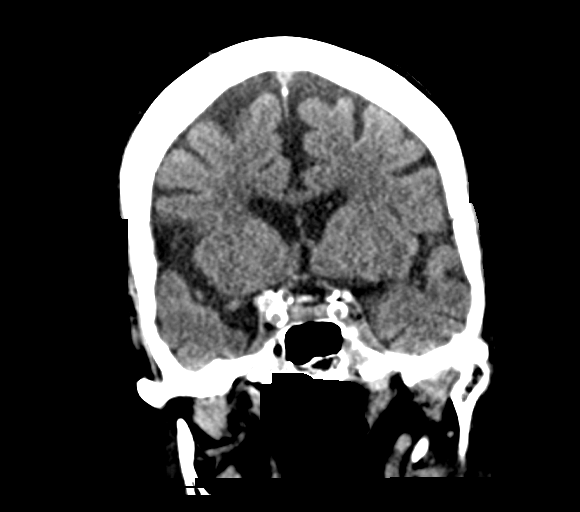
[im 44/79  brain]
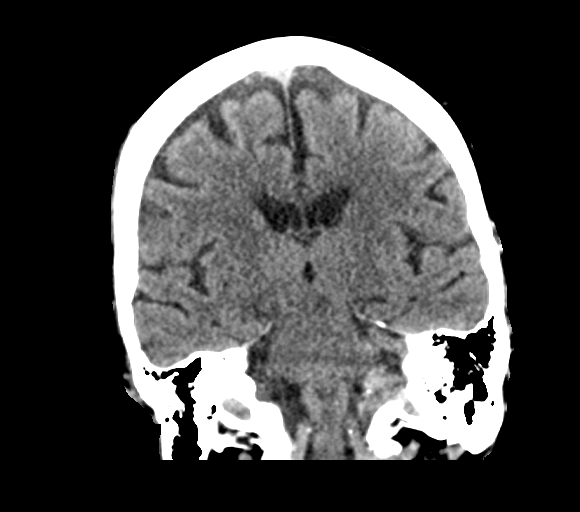

[Series 6: head without sag · sagittal · non-contrast · 0.34mm/px · 3 of 62 slices shown]
[im 21/62  brain]
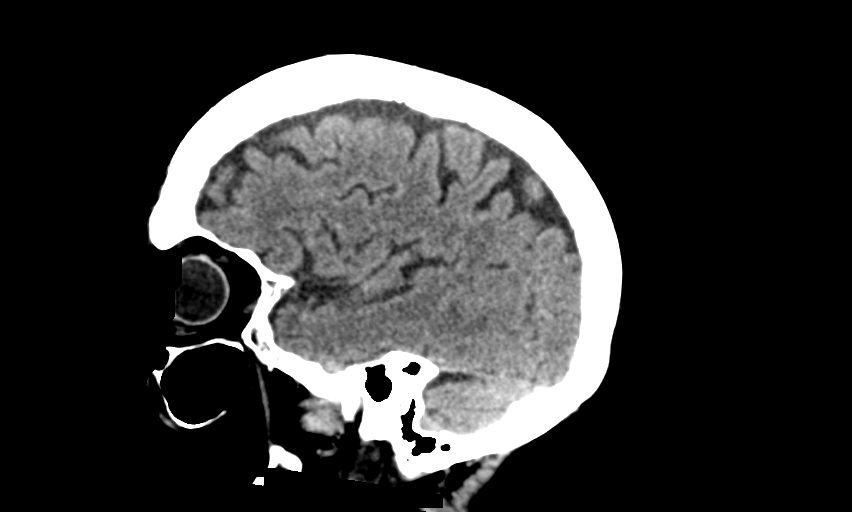
[im 31/62  brain]
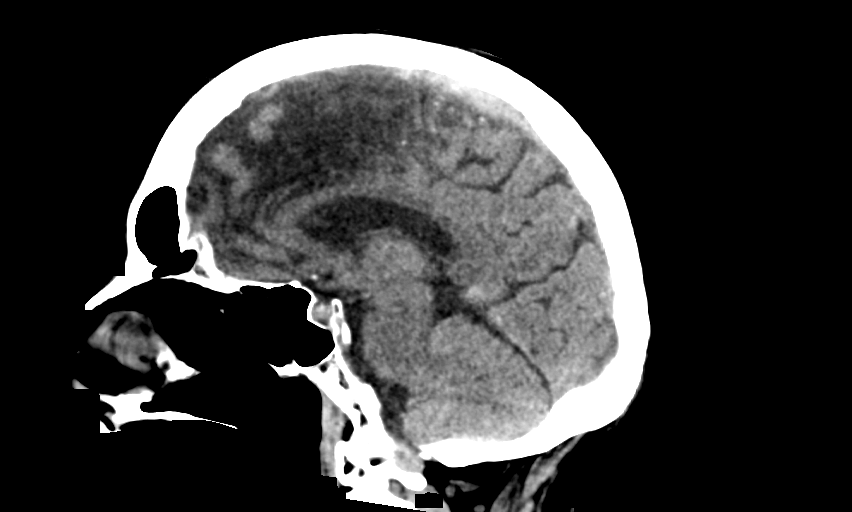
[im 41/62  brain]
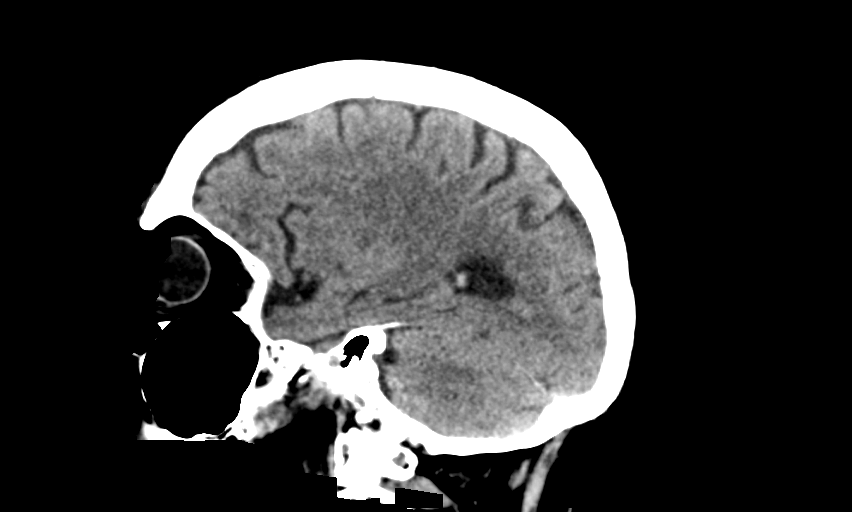

[15 of 47 positions shown; findings below may reference images not displayed]

FINDINGS: Brain: No acute intracranial hemorrhage. No focal mass lesion. No CT
evidence of acute infarction. No midline shift or mass effect. No
hydrocephalus. Basilar cisterns are patent.

There are periventricular and subcortical white matter
hypodensities. Generalized cortical atrophy.

Vascular: No hyperdense vessel or unexpected calcification.

Skull: Normal. Negative for fracture or focal lesion.

Sinuses/Orbits: Paranasal sinuses and mastoid air cells are clear.
Orbits are clear.

Other: None.
IMPRESSION: 1. No acute intracranial findings.
2. Atrophy and white matter microvascular disease.

## 2021-04-29 ENCOUNTER — Other Ambulatory Visit: Payer: Self-pay | Admitting: Family Medicine

## 2021-04-29 NOTE — Telephone Encounter (Signed)
Earliest Fill Date for medications: 05/22/2021

## 2021-05-22 ENCOUNTER — Telehealth: Payer: Self-pay | Admitting: Family Medicine

## 2021-05-22 NOTE — Telephone Encounter (Signed)
Stephaine from [facility] call in requesting Appointment notes be faxed [phone]

## 2021-05-22 NOTE — Telephone Encounter (Signed)
We have not seen this pt in almost a year. Will route to management to see if we are allowed to even send OV notes w/o a release on file, please advise

## 2021-05-24 ENCOUNTER — Other Ambulatory Visit: Payer: Self-pay | Admitting: Family Medicine

## 2021-05-27 NOTE — Telephone Encounter (Signed)
Looked online and their isn't a direct # you have to choose a dpt you are wanting to speak too (ex. nursing, DME, meds, C-pap, ect)  Looking under Media I don't see [facility] had any orders on pt, pt has been getting care through [facility].  Since I can't find a phone # and also since I don't see [facility] has ever managed pt's care(that I can see) will await a call back if they need records.   **If [facility] calls back please get a call back # from company**

## 2021-05-27 NOTE — Telephone Encounter (Signed)
Larene Beach, RN sent me a message back saying:  Can you please contact [employer] and let them know the pt has not been seen in over a year. They probably already have the notes from that [place] since the pt has been receiving HH. Find out why they need the notes and what services they are currently providing the pt. There was no phone number taken when this call was taken so you will need to look up the phone number for [facility]. If they are looking for notes for continuity of care, then yes we can fax. We need to clarify first what services they are providing and make sure they know the notes we have are from more than a year old. I am pretty sure they will not want notes that are that old.

## 2021-05-29 NOTE — Telephone Encounter (Signed)
Granddaughter Gareth Morgan called back. She advise me that they had set pt up to get some equipment through [facility], and request that I fax the last OV note to them. I advise granddaughter that I would fax last OV note but it's from 07/15/20 and most home health agencies needs an appt within the last 30 days. She said she would check with them and if need be she will call back and schedule a f/u with PCP  OV note faxed from 07/15/20

## 2021-05-31 DIAGNOSIS — K219 Gastro-esophageal reflux disease without esophagitis: Secondary | ICD-10-CM | POA: Diagnosis not present

## 2021-05-31 DIAGNOSIS — F0390 Unspecified dementia without behavioral disturbance: Secondary | ICD-10-CM | POA: Diagnosis not present

## 2021-05-31 DIAGNOSIS — N183 Chronic kidney disease, stage 3 unspecified: Secondary | ICD-10-CM | POA: Diagnosis not present

## 2021-05-31 DIAGNOSIS — R32 Unspecified urinary incontinence: Secondary | ICD-10-CM | POA: Diagnosis not present

## 2021-08-02 ENCOUNTER — Telehealth: Payer: Self-pay | Admitting: Family Medicine

## 2021-08-04 NOTE — Telephone Encounter (Signed)
Please schedule f/u and refill until then Thanks  

## 2021-08-04 NOTE — Telephone Encounter (Signed)
Pt hasn't been seen in over a year and no future appts., please advise  

## 2021-08-05 NOTE — Telephone Encounter (Signed)
BP med refilled once and will route to support pool to schedule appt

## 2021-08-05 NOTE — Telephone Encounter (Signed)
Called pt to schedule appt. Pt did not answer so I left a voicemail. 

## 2021-08-05 NOTE — Telephone Encounter (Signed)
Pt daughter called back to schedule appt for 8/23

## 2021-08-12 ENCOUNTER — Ambulatory Visit: Payer: Medicare HMO | Admitting: Family Medicine

## 2021-08-19 ENCOUNTER — Other Ambulatory Visit: Payer: Self-pay | Admitting: Family Medicine

## 2021-08-20 ENCOUNTER — Other Ambulatory Visit: Payer: Self-pay | Admitting: Family Medicine

## 2021-08-20 NOTE — Telephone Encounter (Signed)
Family cancelled her f/u for this month, please reach out to family and get a new f/u appt scheduled, we haven't seen pt in over a year and no future appts.,  once appt has been r/s please route back to me to refill her cholesterol med

## 2021-08-20 NOTE — Telephone Encounter (Signed)
Pt daughter called back to schedule appt for 9/13

## 2021-08-20 NOTE — Telephone Encounter (Signed)
Called pt to schedule appt. Pt did not answer so I left a VM.

## 2021-09-02 ENCOUNTER — Encounter: Payer: Self-pay | Admitting: Family Medicine

## 2021-09-02 ENCOUNTER — Telehealth (INDEPENDENT_AMBULATORY_CARE_PROVIDER_SITE_OTHER): Payer: Medicare HMO | Admitting: Family Medicine

## 2021-09-02 ENCOUNTER — Other Ambulatory Visit: Payer: Self-pay

## 2021-09-02 VITALS — BP 113/69 | Ht 63.0 in | Wt 113.0 lb

## 2021-09-02 DIAGNOSIS — E7800 Pure hypercholesterolemia, unspecified: Secondary | ICD-10-CM | POA: Diagnosis not present

## 2021-09-02 DIAGNOSIS — F0390 Unspecified dementia without behavioral disturbance: Secondary | ICD-10-CM | POA: Diagnosis not present

## 2021-09-02 DIAGNOSIS — N1831 Chronic kidney disease, stage 3a: Secondary | ICD-10-CM

## 2021-09-02 DIAGNOSIS — I10 Essential (primary) hypertension: Secondary | ICD-10-CM | POA: Diagnosis not present

## 2021-09-02 MED ORDER — METOPROLOL SUCCINATE ER 50 MG PO TB24: ORAL_TABLET | ORAL | 11 refills | Status: DC

## 2021-09-02 MED ORDER — DONEPEZIL HCL 10 MG PO TABS: 10.0000 mg | ORAL_TABLET | Freq: Every day | ORAL | 11 refills | Status: DC

## 2021-09-02 MED ORDER — MEMANTINE HCL 5 MG PO TABS: 5.0000 mg | ORAL_TABLET | Freq: Two times a day (BID) | ORAL | 11 refills | Status: DC

## 2021-09-02 MED ORDER — LOSARTAN POTASSIUM 50 MG PO TABS: 50.0000 mg | ORAL_TABLET | Freq: Every day | ORAL | 11 refills | Status: DC

## 2021-09-02 MED ORDER — SIMVASTATIN 20 MG PO TABS: 20.0000 mg | ORAL_TABLET | Freq: Every day | ORAL | 11 refills | Status: DC

## 2021-09-02 NOTE — Assessment & Plan Note (Signed)
Disc goals for lipids and reasons to control them Rev last labs with pt Rev low sat fat diet in detail Pt continues zocor 20 mg daily  Diet is fair /eats what she wants at 87 yo

## 2021-09-02 NOTE — Assessment & Plan Note (Signed)
bp in fair control at this time  BP Readings from Last 1 Encounters:  09/02/21 113/69   No changes needed Most recent labs reviewed  Disc lifstyle change with low sodium diet and exercise  Plan to continue losartan 50 mg daily and metoprolol xl 50 mg daily  Enc family to continue watching numbers Plan labs after return to the [facility] office

## 2021-09-02 NOTE — Patient Instructions (Addendum)
Start the generic namenda (memantine) once daily for 1-2 weeks and if no problems increase to twice daily  Continue the generic aricept daily (donepizil)  Continue other medicines as ordered  Keep watching blood pressure and alert me if any concerns Call the office and schedule fasting labs for mid January or later   Try to eat a healthy diet  Stay as active as you can be, safely (body and mid)   Continue vitamin D for bone health

## 2021-09-02 NOTE — Progress Notes (Signed)
Virtual Visit via Video Note  I connected with Amanda Salas on 09/02/21 at  3:00 PM EDT by a video enabled telemedicine application and verified that I am speaking with the correct person using two identifiers.  Location: Patient: home Provider: office   I discussed the limitations of evaluation and management by telemedicine and the availability of in person appointments. The patient expressed understanding and agreed to proceed.  Parties involved in encounter  Patient: Amanda Salas Daughter : Amanda Salas  Provider:  Loura Pardon MD   History of Present Illness: Pt presents for HTN f/u as well as reviewe of chronic medical problems including dementia   Wt Readings from Last 3 Encounters:  09/02/21 113 lb (51.3 kg)  07/15/20 113 lb 3 oz (51.3 kg)  06/09/20 115 lb (52.2 kg)   20.02 kg/m  Feels ok overall  Feels her age  Gets out a bit, to get hair done   Some aches and pains  Complains about her back and tylenol helps prn   bp is stable at last visit No cp or palpitations or headaches or edema  No side effects to medicines  BP Readings from Last 3 Encounters:  07/15/20 (!) 132/76  06/12/20 (!) 146/60  03/25/20 132/64    BP has been good  113/69 last check   taking losartan 50 mg daily  Metoprolol xl 50 mg daily   H/o chronic kidney dz  Worse after dehydration/hip fracture in 2021 Lab Results  Component Value Date   CREATININE 0.92 07/15/2020   BUN 25 (H) 07/15/2020   NA 134 (L) 07/15/2020   K 4.3 07/15/2020   CL 101 07/15/2020   CO2 26 07/15/2020   Improved at last visit    Mild cognitive impairment  Real forgetful- memory is getting worse  Takes aricept 10 mg daily  Hyperlipidemia  Lab Results  Component Value Date   CHOL 143 08/30/2018   HDL 78.50 08/30/2018   LDLCALC 44 08/30/2018   LDLDIRECT 81.0 07/01/2010   TRIG 104.0 08/30/2018   CHOLHDL 2 08/30/2018   Taking simvastatin 20 mg daily  Patient Active Problem List   Diagnosis  Date Noted   Closed comminuted intertrochanteric fracture of left femur ([place]) 06/09/2020   Stage 3a chronic kidney disease ([place]) 06/09/2020   Elevated glucose 06/09/2020   Medicare annual wellness visit, subsequent 08/30/2018   Routine general medical examination at a health care facility 08/30/2018   Advance care planning 08/30/2018   H/O fracture of hip 10/30/2017   Dystrophic nail 04/23/2015   Encounter for Medicare annual wellness exam 04/17/2014   Colon cancer screening 04/17/2014   Anemia 04/17/2014   History of vertebral compression fracture 12/26/2013   Low back pain 11/22/2013   Mycotic toenails 11/04/2011   Dementia ([place]) 12/29/2010   Vitamin D deficiency 07/01/2010   FALL, HX OF 07/01/2010   Hyperlipidemia 08/10/2008   Essential hypertension 08/10/2008   ALLERGIC RHINITIS 08/10/2008   GERD 08/10/2008   Osteoporosis 08/10/2008   Past Medical History:  Diagnosis Date   Allergy    Arthritis    Cataract    Dementia ([place])    Disorder of bone and cartilage, unspecified    GERD (gastroesophageal reflux disease)    Hepatitis    Hyperlipidemia    Hypertension    Thrombocytopenia, unspecified ([place])    Past Surgical History:  Procedure Laterality Date   CARDIAC CATHETERIZATION     ESOPHAGOGASTRODUODENOSCOPY     INTRAMEDULLARY (IM) NAIL INTERTROCHANTERIC Left 06/09/2020  INTRAMEDULLARY (IM) NAIL INTERTROCHANTERIC Left 06/09/2020   Procedure: INTRAMEDULLARY (IM) NAIL INTERTROCHANTRIC;  Surgeon: Leandrew Koyanagi, MD;  Location: [facility];  Service: Orthopedics;  Laterality: Left;   TOTAL HIP ARTHROPLASTY Right 10/31/2017   Procedure: TOTAL HIP ARTHROPLASTY DIRECT ANTERIOR APPROACH;  Surgeon: Leandrew Koyanagi, MD;  Location: [facility];  Service: Orthopedics;  Laterality: Right;   Social History   Tobacco Use   Smoking status: Never   Smokeless tobacco: Never  Vaping Use   Vaping Use: Never used  Substance Use Topics   Alcohol use: No    Alcohol/week: 0.0 standard drinks   Drug  use: No   Family History  Problem Relation Age of Onset   Alzheimer's disease Mother    Allergies  Allergen Reactions   Ace Inhibitors     REACTION: cough   Alendronate Sodium     REACTION: pill got stuck   Current Outpatient Medications on File Prior to Visit  Medication Sig Dispense Refill   acetaminophen (TYLENOL) 325 MG tablet Take 2 tablets (650 mg total) by mouth every 6 (six) hours as needed for mild pain (pain score 1-3 or temp > 100.5). (Patient taking differently: Take 650 mg by mouth every 6 (six) hours as needed for mild pain. Also takes 2 tablets at bedtime) 30 tablet 0   alum & mag hydroxide-simeth (MAALOX/MYLANTA) 200-200-20 MG/5ML suspension Take 30 mLs by mouth every 4 (four) hours as needed for indigestion. 355 mL 0   Calcium Carbonate-Vitamin D (CALCIUM-D PO) Take 1 tablet by mouth daily.     Cholecalciferol (VITAMIN D3 PO) Take 1,000 Units daily by mouth.      docusate sodium (COLACE) 100 MG capsule Take 1 capsule (100 mg total) by mouth 2 (two) times daily. 10 capsule 0   melatonin 3 MG TABS tablet Take 1 tablet (3 mg total) by mouth at bedtime as needed (sleep). 20 tablet 0   No current facility-administered medications on file prior to visit.   Review of Systems  Constitutional:  Negative for chills, fever and malaise/fatigue.  HENT:  Negative for congestion, ear pain, sinus pain and sore throat.   Eyes:  Negative for blurred vision, discharge and redness.  Respiratory:  Negative for cough, shortness of breath and stridor.   Cardiovascular:  Negative for chest pain, palpitations and leg swelling.  Gastrointestinal:  Negative for abdominal pain, diarrhea, nausea and vomiting.  Musculoskeletal:  Positive for joint pain. Negative for myalgias.  Skin:  Negative for rash.  Neurological:  Negative for dizziness and headaches.  Psychiatric/Behavioral:         Memory loss    Observations/Objective: Patient appears well, in no distress Weight is baseline  No facial  swelling or asymmetry Normal voice-not hoarse and no slurred speech No obvious tremor or mobility impairment Moving neck and UEs normally Able to hear the call well  No cough or shortness of breath during interview  Responds to questions appropriately, poor short term memory, family helps with history No skin changes on face or neck , no rash or pallor Affect is normal    Assessment and Plan: Problem List Items Addressed This Visit       Cardiovascular and Mediastinum   Essential hypertension - Primary    bp in fair control at this time  BP Readings from Last 1 Encounters:  09/02/21 113/69  No changes needed Most recent labs reviewed  Disc lifstyle change with low sodium diet and exercise  Plan to continue losartan 50 mg  daily and metoprolol xl 50 mg daily  Enc family to continue watching numbers Plan labs after return to the [facility] office      Relevant Medications   losartan (COZAAR) 50 MG tablet   metoprolol succinate (TOPROL-XL) 50 MG 24 hr tablet   simvastatin (ZOCOR) 20 MG tablet     Nervous and Auditory   Dementia (HCC)    MCI has progressed to dementia No behavioral problems  Family cares for her with some home care  Plan to continue aricept 10 mg daily  Add namenda 5 mg daily and if tolerated inc to bid in 1-2 weeks  Disc poss side effects and inst to update  Labs planned for January       Relevant Medications   memantine (NAMENDA) 5 MG tablet   donepezil (ARICEPT) 10 MG tablet     Genitourinary   Stage 3a chronic kidney disease (HCC) (Chronic)    Per family, good fluid intake  Will plan labs for January-after return to [facility] office        Other   Hyperlipidemia    Disc goals for lipids and reasons to control them Rev last labs with pt Rev low sat fat diet in detail Pt continues zocor 20 mg daily  Diet is fair /eats what she wants at 87 yo        Relevant Medications   losartan (COZAAR) 50 MG tablet   metoprolol succinate (TOPROL-XL) 50 MG  24 hr tablet   simvastatin (ZOCOR) 20 MG tablet     Follow Up Instructions: Start the generic namenda (memantine) once daily for 1-2 weeks and if no problems increase to twice daily  Continue the generic aricept daily (donepizil)  Continue other medicines as ordered  Keep watching blood pressure and alert me if any concerns Call the office and schedule fasting labs for mid January or later   Try to eat a healthy diet  Stay as active as you can be, safely (body and mid)   Continue vitamin D for bone health    I discussed the assessment and treatment plan with the patient. The patient was provided an opportunity to ask questions and all were answered. The patient agreed with the plan and demonstrated an understanding of the instructions.   The patient was advised to call back or seek an in-person evaluation if the symptoms worsen or if the condition fails to improve as anticipated.     Loura Pardon, MD

## 2021-09-02 NOTE — Assessment & Plan Note (Signed)
Per family, good fluid intake  Will plan labs for January-after return to [facility] office

## 2021-09-02 NOTE — Assessment & Plan Note (Signed)
MCI has progressed to dementia No behavioral problems  Family cares for her with some home care  Plan to continue aricept 10 mg daily  Add namenda 5 mg daily and if tolerated inc to bid in 1-2 weeks  Disc poss side effects and inst to update  Labs planned for January

## 2022-01-17 DIAGNOSIS — R051 Acute cough: Secondary | ICD-10-CM | POA: Diagnosis not present

## 2022-01-17 DIAGNOSIS — R062 Wheezing: Secondary | ICD-10-CM | POA: Diagnosis not present

## 2022-02-02 DIAGNOSIS — M25511 Pain in right shoulder: Secondary | ICD-10-CM | POA: Diagnosis not present

## 2022-02-02 DIAGNOSIS — M13811 Other specified arthritis, right shoulder: Secondary | ICD-10-CM | POA: Diagnosis not present

## 2022-02-02 DIAGNOSIS — S42201A Unspecified fracture of upper end of right humerus, initial encounter for closed fracture: Secondary | ICD-10-CM | POA: Diagnosis not present

## 2022-02-03 ENCOUNTER — Encounter: Payer: Self-pay | Admitting: Family Medicine

## 2022-02-03 DIAGNOSIS — F03B4 Unspecified dementia, moderate, with anxiety: Secondary | ICD-10-CM

## 2022-02-06 ENCOUNTER — Telehealth: Payer: Self-pay | Admitting: *Deleted

## 2022-02-06 NOTE — Chronic Care Management (AMB) (Signed)
Chronic Care Management  ° °Note ° °02/06/2022 °Name: Amanda Salas MRN: [id number] DOB: 07/24/1927 ° °Amanda Salas is a 88 y.o. year old female who is a primary care patient of Tower, Marne A, MD. I reached out to Amanda Salas by phone today in response to a referral sent by Amanda Salas's PCP. ° °Amanda Salas was given information about Chronic Care Management services today including:  °CCM service includes personalized support from designated clinical staff supervised by her physician, including individualized plan of care and coordination with other care providers °24/7 contact phone numbers for assistance for urgent and routine care needs. °Service will only be billed when office clinical staff spend 20 minutes or more in a month to coordinate care. °Only one practitioner may furnish and bill the service in a calendar month. °The patient may stop CCM services at any time (effective at the end of the month) by phone call to the office staff. °The patient is responsible for co-pay (up to 20% after annual deductible is met) if co-pay is required by the individual health plan.  ° °Patient agreed to services and verbal consent obtained.  ° °Follow up plan: °Telephone appointment with care management team member scheduled for: 02/13/2022 ° ° , CCMA °Care Guide, Embedded Care Coordination °[facility]   Care Management  °Direct Dial: [phone] ° ° °

## 2022-02-06 NOTE — Chronic Care Management (AMB) (Signed)
°  Chronic Care Management   Outreach Note  02/06/2022 Name: [name] MRN: [id number] DOB: May 18, 1927  Amanda Salas is a 88 y.o. year old female who is a primary care patient of Tower, Wynelle Fanny, MD. I reached out to Amanda Salas by phone today in response to a referral sent by Ms. Mariane Baumgarten Twedt's primary care provider.  An unsuccessful telephone outreach was attempted today. The patient was referred to the case management team for assistance with care management and care coordination.   Follow Up Plan: A HIPAA compliant phone message was left for the patient providing contact information and requesting a return call.  If patient returns call to provider office, please advise to call Embedded Care Management Care Guide Winda Summerall at [place], [place] [facility]  Direct Dial: [phone]

## 2022-02-11 DIAGNOSIS — S42201A Unspecified fracture of upper end of right humerus, initial encounter for closed fracture: Secondary | ICD-10-CM | POA: Diagnosis not present

## 2022-02-13 ENCOUNTER — Ambulatory Visit (INDEPENDENT_AMBULATORY_CARE_PROVIDER_SITE_OTHER): Payer: Medicaid Other | Admitting: *Deleted

## 2022-02-13 DIAGNOSIS — F03B4 Unspecified dementia, moderate, with anxiety: Secondary | ICD-10-CM

## 2022-02-13 DIAGNOSIS — Z8781 Personal history of (healed) traumatic fracture: Secondary | ICD-10-CM

## 2022-02-13 DIAGNOSIS — I10 Essential (primary) hypertension: Secondary | ICD-10-CM

## 2022-02-13 NOTE — Chronic Care Management (AMB) (Signed)
Chronic Care Management    Clinical Social Work Note  02/13/2022 Name: Amanda Salas MRN: [id number] DOB: [birth year]  Amanda Salas is a 88 y.o. year old female who is a primary care patient of Tower, Wynelle Fanny, MD. The CCM team was consulted to assist the patient with chronic disease management and/or care coordination needs related to: [employer] , Level of Care Concerns, and Caregiver Stress.   Engaged with patient by telephone for initial visit in response to provider referral for social work chronic care management and care coordination services.   Consent to Services:  The patient was given information about Chronic Care Management services, agreed to services, and gave verbal consent prior to initiation of services.  Please see initial visit note for detailed documentation.   Patient agreed to services and consent obtained.   Assessment: Review of patient past medical history, allergies, medications, and health status, including review of relevant consultants reports was performed today as part of a comprehensive evaluation and provision of chronic care management and care coordination services.     SDOH (Social Determinants of Health) assessments and interventions performed:  SDOH Interventions    Flowsheet Row Most Recent Value  SDOH Interventions   Food Insecurity Interventions Intervention Not Indicated        Advanced Directives Status: See Care Plan for related entries.  CCM Care Plan  Allergies  Allergen Reactions   Ace Inhibitors     REACTION: cough   Alendronate Sodium     REACTION: pill got stuck    Outpatient Encounter Medications as of 02/13/2022  Medication Sig   acetaminophen (TYLENOL) 325 MG tablet Take 2 tablets (650 mg total) by mouth every 6 (six) hours as needed for mild pain (pain score 1-3 or temp > 100.5). (Patient taking differently: Take 650 mg by mouth every 6 (six) hours as needed for mild pain. Also takes 2 tablets at bedtime)    alum & mag hydroxide-simeth (MAALOX/MYLANTA) 200-200-20 MG/5ML suspension Take 30 mLs by mouth every 4 (four) hours as needed for indigestion.   Calcium Carbonate-Vitamin D (CALCIUM-D PO) Take 1 tablet by mouth daily.   Cholecalciferol (VITAMIN D3 PO) Take 1,000 Units daily by mouth.    docusate sodium (COLACE) 100 MG capsule Take 1 capsule (100 mg total) by mouth 2 (two) times daily.   donepezil (ARICEPT) 10 MG tablet Take 1 tablet (10 mg total) by mouth at bedtime.   losartan (COZAAR) 50 MG tablet Take 1 tablet (50 mg total) by mouth daily.   melatonin 3 MG TABS tablet Take 1 tablet (3 mg total) by mouth at bedtime as needed (sleep).   memantine (NAMENDA) 5 MG tablet Take 1 tablet (5 mg total) by mouth 2 (two) times daily.   metoprolol succinate (TOPROL-XL) 50 MG 24 hr tablet TAKE 1 TABLET DAILY WITH OR IMMEDIATELY FOLLOWING A MEAL   simvastatin (ZOCOR) 20 MG tablet Take 1 tablet (20 mg total) by mouth at bedtime.   No facility-administered encounter medications on file as of 02/13/2022.    Patient Active Problem List   Diagnosis Date Noted   Closed comminuted intertrochanteric fracture of left femur ([place]) 06/09/2020   Stage 3a chronic kidney disease ([place]) 06/09/2020   Elevated glucose 06/09/2020   Medicare annual wellness visit, subsequent 08/30/2018   Routine general medical examination at a health care facility 08/30/2018   Advance care planning 08/30/2018   H/O fracture of hip 10/30/2017   Dystrophic nail 04/23/2015   Encounter for Medicare annual  wellness exam 04/17/2014   Colon cancer screening 04/17/2014   Anemia 04/17/2014   History of vertebral compression fracture 12/26/2013   Low back pain 11/22/2013   Mycotic toenails 11/04/2011   Dementia ([place]) 12/29/2010   Vitamin D deficiency 07/01/2010   FALL, HX OF 07/01/2010   Hyperlipidemia 08/10/2008   Essential hypertension 08/10/2008   ALLERGIC RHINITIS 08/10/2008   GERD 08/10/2008   Osteoporosis 08/10/2008    Conditions  to be addressed/monitored: Dementia; Memory Deficits and Lacks knowledge of community resource:    Care Plan : LCSW  Plan of Care  Updates made by Deirdre Peer, LCSW since 02/13/2022 12:00 AM     Problem: Long-Term Care Planning      Long-Range Goal: Effective Long-Term Care Planning   Start Date: 02/13/2022  Expected End Date: 05/04/2022  This Visit's Progress: On track  Priority: High  Note:   Current Barriers:  Care Coordination needs related to Dementia: Dementia    CSW Clinical Goal(s):  Caregiver  will work with Education officer, museum to address concerns related to long term care needs/dementia  through collaboration with [place] representative, provider, and care team.   Interventions: CSW made contact with pt's granddaughter, Glenard Haring, who pt lives with. Per Glenard Haring, pt has CAPS caregiver help in the home along with family- pt is not alone much and they have cameras in place also.  Family prefers keeping pt at home- discussed options to include ALF, Adult Day Care, etc. Also encouraged family to research pt's UHC advantage plan benefits and to take advantage of all it offers- U[employer], transportation, etc. 1:1 collaboration with primary care provider regarding development and update of comprehensive plan of care as evidenced by provider attestation and co-signature Inter-disciplinary care team collaboration (see longitudinal plan of care) Evaluation of current treatment plan related to  self management and patient's adherence to plan as established by provider Review resources, discussed options and provided patient information about  Adult Day Care, ALF/memory care, UHC benefits    Level of Care Concerns in a patient with Dementia:  (Status: New goal.) Current level of care: home with other family or significant other(s): family member: granddaughter, daughter and others Evaluation of patient safety in current living environment Solution-Focused Strategies employed:  Active listening /  Reflection utilized  Emotional Support Provided Problem [place] strategies reviewed Caregiver stress acknowledged  Consideration of in-home help encouraged : options discussed Assessed needs, level of care concerns, how currently meeting needs and barriers to care Family prefers to keep pt at home Pt's daughter, [name], is HCP[employer] CAPS program support  Patient Self-Care Activities: Call your insurance provider to discuss transportation options Call your insurance provider for more information about your Enhanced Benefits  Continue with compliance of taking medication  Continue with in-home care/support through CAPS program           Follow Up Plan: Appointment scheduled for SW follow up with client by phone on: 03/16/22

## 2022-02-13 NOTE — Patient Instructions (Signed)
Visit Information  Thank you for taking time to visit with me today. Please don't hesitate to contact me if I can be of assistance to you before our next scheduled telephone appointment.  Following are the goals we discussed today:  Call your insurance provider to discuss transportation options Call your insurance provider for more information about your Enhanced Benefits  Continue with compliance of taking medication  Continue with in-home care/support through CAPS program  Our next appointment is by telephone on 03/16/22 at 10  Please call the care guide team at [phone] if you need to cancel or reschedule your appointment.   If you are experiencing a Mental Health or [place] or need someone to talk to, please call 911   Patient verbalizes understanding of instructions and care plan provided today and agrees to view in [place]. Active MyChart status confirmed with patient.    Eduard Clos MSW, LCSW Licensed Clinical Social Worker [place]   [phone]

## 2022-03-09 DIAGNOSIS — M80021D Age-related osteoporosis with current pathological fracture, right humerus, subsequent encounter for fracture with routine healing: Secondary | ICD-10-CM | POA: Diagnosis not present

## 2022-03-09 DIAGNOSIS — S46811D Strain of other muscles, fascia and tendons at shoulder and upper arm level, right arm, subsequent encounter: Secondary | ICD-10-CM | POA: Diagnosis not present

## 2022-03-09 DIAGNOSIS — M7591 Shoulder lesion, unspecified, right shoulder: Secondary | ICD-10-CM | POA: Diagnosis not present

## 2022-03-09 DIAGNOSIS — S42201A Unspecified fracture of upper end of right humerus, initial encounter for closed fracture: Secondary | ICD-10-CM | POA: Diagnosis not present

## 2022-03-09 DIAGNOSIS — M19011 Primary osteoarthritis, right shoulder: Secondary | ICD-10-CM | POA: Diagnosis not present

## 2022-03-09 DIAGNOSIS — Z9181 History of falling: Secondary | ICD-10-CM | POA: Diagnosis not present

## 2022-03-09 DIAGNOSIS — W19XXXD Unspecified fall, subsequent encounter: Secondary | ICD-10-CM | POA: Diagnosis not present

## 2022-03-11 DIAGNOSIS — S42201A Unspecified fracture of upper end of right humerus, initial encounter for closed fracture: Secondary | ICD-10-CM | POA: Diagnosis not present

## 2022-03-12 DIAGNOSIS — M80021D Age-related osteoporosis with current pathological fracture, right humerus, subsequent encounter for fracture with routine healing: Secondary | ICD-10-CM | POA: Diagnosis not present

## 2022-03-12 DIAGNOSIS — S46811D Strain of other muscles, fascia and tendons at shoulder and upper arm level, right arm, subsequent encounter: Secondary | ICD-10-CM | POA: Diagnosis not present

## 2022-03-12 DIAGNOSIS — M19011 Primary osteoarthritis, right shoulder: Secondary | ICD-10-CM | POA: Diagnosis not present

## 2022-03-12 DIAGNOSIS — M7591 Shoulder lesion, unspecified, right shoulder: Secondary | ICD-10-CM | POA: Diagnosis not present

## 2022-03-12 DIAGNOSIS — S42201A Unspecified fracture of upper end of right humerus, initial encounter for closed fracture: Secondary | ICD-10-CM | POA: Diagnosis not present

## 2022-03-12 DIAGNOSIS — W19XXXD Unspecified fall, subsequent encounter: Secondary | ICD-10-CM | POA: Diagnosis not present

## 2022-03-12 DIAGNOSIS — Z9181 History of falling: Secondary | ICD-10-CM | POA: Diagnosis not present

## 2022-03-16 ENCOUNTER — Ambulatory Visit (INDEPENDENT_AMBULATORY_CARE_PROVIDER_SITE_OTHER): Payer: Medicare Other | Admitting: *Deleted

## 2022-03-16 DIAGNOSIS — M19011 Primary osteoarthritis, right shoulder: Secondary | ICD-10-CM | POA: Diagnosis not present

## 2022-03-16 DIAGNOSIS — I10 Essential (primary) hypertension: Secondary | ICD-10-CM

## 2022-03-16 DIAGNOSIS — N1831 Chronic kidney disease, stage 3a: Secondary | ICD-10-CM

## 2022-03-16 DIAGNOSIS — W19XXXA Unspecified fall, initial encounter: Secondary | ICD-10-CM

## 2022-03-16 DIAGNOSIS — Z9181 History of falling: Secondary | ICD-10-CM | POA: Diagnosis not present

## 2022-03-16 DIAGNOSIS — G3184 Mild cognitive impairment, so stated: Secondary | ICD-10-CM

## 2022-03-16 DIAGNOSIS — W19XXXD Unspecified fall, subsequent encounter: Secondary | ICD-10-CM | POA: Diagnosis not present

## 2022-03-16 DIAGNOSIS — S42201A Unspecified fracture of upper end of right humerus, initial encounter for closed fracture: Secondary | ICD-10-CM | POA: Diagnosis not present

## 2022-03-16 DIAGNOSIS — M80021D Age-related osteoporosis with current pathological fracture, right humerus, subsequent encounter for fracture with routine healing: Secondary | ICD-10-CM | POA: Diagnosis not present

## 2022-03-16 DIAGNOSIS — M7591 Shoulder lesion, unspecified, right shoulder: Secondary | ICD-10-CM | POA: Diagnosis not present

## 2022-03-16 DIAGNOSIS — S46811D Strain of other muscles, fascia and tendons at shoulder and upper arm level, right arm, subsequent encounter: Secondary | ICD-10-CM | POA: Diagnosis not present

## 2022-03-16 NOTE — Chronic Care Management (AMB) (Signed)
?Chronic Care Management  ? ? Clinical Social Work Note ? ?03/16/2022 ?Name: MANJIT BUFANO MRN: [id number] DOB: [birth year] ? ?KALANDRA MASTERS is a 88 y.o. year old female who is a primary care patient of Tower, Wynelle Fanny, MD. The CCM team was consulted to assist the patient with chronic disease management and/or care coordination needs related to: [employer] , Level of Care Concerns, and Caregiver Stress.  ? ?Engaged with patient by telephone for follow up visit in response to provider referral for social work chronic care management and care coordination services.  ? ?Consent to Services:  ?The patient was given information about Chronic Care Management services, agreed to services, and gave verbal consent prior to initiation of services.  Please see initial visit note for detailed documentation.  ? ?Patient agreed to services and consent obtained.  ? ?Assessment: Review of patient past medical history, allergies, medications, and health status, including review of relevant consultants reports was performed today as part of a comprehensive evaluation and provision of chronic care management and care coordination services.    ? ?SDOH (Social Determinants of Health) assessments and interventions performed:   ? ?Advanced Directives Status: See Care Plan for related entries. ? ?CCM Care Plan ? ?Allergies  ?Allergen Reactions  ? Ace Inhibitors   ?  REACTION: cough  ? Alendronate Sodium   ?  REACTION: pill got stuck  ? ? ?Outpatient Encounter Medications as of 03/16/2022  ?Medication Sig  ? acetaminophen (TYLENOL) 325 MG tablet Take 2 tablets (650 mg total) by mouth every 6 (six) hours as needed for mild pain (pain score 1-3 or temp > 100.5). (Patient taking differently: Take 650 mg by mouth every 6 (six) hours as needed for mild pain. Also takes 2 tablets at bedtime)  ? alum & mag hydroxide-simeth (MAALOX/MYLANTA) 200-200-20 MG/5ML suspension Take 30 mLs by mouth every 4 (four) hours as needed for indigestion.   ? Calcium Carbonate-Vitamin D (CALCIUM-D PO) Take 1 tablet by mouth daily.  ? Cholecalciferol (VITAMIN D3 PO) Take 1,000 Units daily by mouth.   ? docusate sodium (COLACE) 100 MG capsule Take 1 capsule (100 mg total) by mouth 2 (two) times daily.  ? donepezil (ARICEPT) 10 MG tablet Take 1 tablet (10 mg total) by mouth at bedtime.  ? losartan (COZAAR) 50 MG tablet Take 1 tablet (50 mg total) by mouth daily.  ? melatonin 3 MG TABS tablet Take 1 tablet (3 mg total) by mouth at bedtime as needed (sleep).  ? memantine (NAMENDA) 5 MG tablet Take 1 tablet (5 mg total) by mouth 2 (two) times daily.  ? metoprolol succinate (TOPROL-XL) 50 MG 24 hr tablet TAKE 1 TABLET DAILY WITH OR IMMEDIATELY FOLLOWING A MEAL  ? simvastatin (ZOCOR) 20 MG tablet Take 1 tablet (20 mg total) by mouth at bedtime.  ? ?No facility-administered encounter medications on file as of 03/16/2022.  ? ? ?Patient Active Problem List  ? Diagnosis Date Noted  ? Closed comminuted intertrochanteric fracture of left femur ([place]) 06/09/2020  ? Stage 3a chronic kidney disease ([place]) 06/09/2020  ? Elevated glucose 06/09/2020  ? Medicare annual wellness visit, subsequent 08/30/2018  ? Routine general medical examination at a health care facility 08/30/2018  ? Advance care planning 08/30/2018  ? H/O fracture of hip 10/30/2017  ? Dystrophic nail 04/23/2015  ? Encounter for Medicare annual wellness exam 04/17/2014  ? Colon cancer screening 04/17/2014  ? Anemia 04/17/2014  ? History of vertebral compression fracture 12/26/2013  ? Low back  pain 11/22/2013  ? 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 Dementia ([place]) 12/29/2010  ? Vitamin D deficiency 07/01/2010  ? FALL, HX OF 07/01/2010  ? Hyperlipidemia 08/10/2008  ? Essential hypertension 08/10/2008  ? ALLERGIC RHINITIS 08/10/2008  ? GERD 08/10/2008  ? Osteoporosis 08/10/2008  ? ? ?Conditions to be addressed/monitored: CKD Stage 3 and Dementia; Level of care concerns, ADL IADL limitations, and Limited access to caregiver ? ?Care  Plan : LCSW  Plan of Care  ?Updates made by Deirdre Peer, LCSW since 03/16/2022 12:00 AM  ?  ? ?Problem: Long-Term Care Planning   ?  ? ?Long-Range Goal: Effective Long-Term Care Planning   ?Start Date: 02/13/2022  ?Expected End Date: 05/04/2022  ?This Visit's Progress: On track  ?Recent Progress: On track  ?Priority: High  ?Note:   ?Current Barriers:  ?Care Coordination needs related to Dementia: Dementia   ? ?CSW Clinical Goal(s):  ?Caregiver  will work with Education officer, museum to address concerns related to long term care needs/dementia  through collaboration with [employer] representative, provider, and care team.  ? ?Interventions: ?03/16/22- CSW spoke with pt's granddaughter, Glenard Haring, who pt lives with. Per Glenard Haring,  the CAPS caregiver support continues and all is going well. "The Orthopedic Doctor was pleased with her shoulder and has started PT and OT at home".  Family denies any concerns or needs from CSW at this time.  ? pt is not alone much and they have cameras in place also.  ?Family prefers keeping pt at home- discussed options to include ALF, Adult Day Care, etc. ?Also encouraged family to research pt's UHC advantage plan benefits and to take advantage of all it offers- Ucard, transportation, etc. ?1:1 collaboration with primary care provider regarding development and update of comprehensive plan of care as evidenced by provider attestation and co-signature ?Inter-disciplinary care team collaboration (see longitudinal plan of care) ?Evaluation of current treatment plan related to  self management and patient's adherence to plan as established by provider ?Review resources, discussed options and provided patient information about  ?Adult Day Care, ALF/memory care, UHC benefits ? ? ? ?Level of Care Concerns in a patient with Dementia:  (Status: New goal.) ?Current level of care: home with other family or significant other(s): family member: granddaughter, daughter and others ?Evaluation of patient safety in current  living environment ?Solution-Focused Strategies employed:  ?Active listening / Reflection utilized  ?Emotional Support Provided ?Problem Solving /Task Center strategies reviewed ?Caregiver stress acknowledged  ?Consideration of in-home help encouraged : options discussed ?Assessed needs, level of care concerns, how currently meeting needs and barriers to care ?Family prefers to keep pt at home ?Pt's daughter, [name], is HCPOA ?CAPS program support ? ?Patient Self-Care Activities: ?Call your insurance provider to discuss transportation options ?Call your insurance provider for more information about your Enhanced Benefits  ?Continue with compliance of taking medication  ?Continue with in-home care/support through CAPS program ? ? ? ?  ?  ?  ? ?Follow Up Plan: Appointment scheduled for SW follow up with client by phone on: 04/24/22 ?     ?Eduard Clos MSW, LCSW ?Licensed Clinical Social Worker ?[place]   ?[phone]  ? ? ?

## 2022-03-16 NOTE — Patient Instructions (Signed)
Visit Information ? ?Thank you for taking time to visit with me today. Please don't hesitate to contact me if I can be of assistance to you before our next scheduled telephone appointment. ? ?Following are the goals we discussed today:  ?(Copy and paste patient goals from clinical care plan here) ? ?Our next appointment is by telephone on 04/24/22   ? ?Please call the care guide team at [phone] if you need to cancel or reschedule your appointment.  ? ?If you are experiencing a Mental Health or [place] or need someone to talk to, please call 911  ? ?Patient verbalizes understanding of instructions and care plan provided today and agrees to view in [facility]. Active MyChart status confirmed with patient.   ? ?Eduard Clos MSW, LCSW ?Licensed Clinical Social Worker ?[place]   ?[phone]  ?

## 2022-03-18 DIAGNOSIS — Z9181 History of falling: Secondary | ICD-10-CM | POA: Diagnosis not present

## 2022-03-18 DIAGNOSIS — M19011 Primary osteoarthritis, right shoulder: Secondary | ICD-10-CM | POA: Diagnosis not present

## 2022-03-18 DIAGNOSIS — W19XXXD Unspecified fall, subsequent encounter: Secondary | ICD-10-CM | POA: Diagnosis not present

## 2022-03-18 DIAGNOSIS — S42201A Unspecified fracture of upper end of right humerus, initial encounter for closed fracture: Secondary | ICD-10-CM | POA: Diagnosis not present

## 2022-03-18 DIAGNOSIS — M80021D Age-related osteoporosis with current pathological fracture, right humerus, subsequent encounter for fracture with routine healing: Secondary | ICD-10-CM | POA: Diagnosis not present

## 2022-03-18 DIAGNOSIS — S46811D Strain of other muscles, fascia and tendons at shoulder and upper arm level, right arm, subsequent encounter: Secondary | ICD-10-CM | POA: Diagnosis not present

## 2022-03-18 DIAGNOSIS — M7591 Shoulder lesion, unspecified, right shoulder: Secondary | ICD-10-CM | POA: Diagnosis not present

## 2022-03-20 DIAGNOSIS — I10 Essential (primary) hypertension: Secondary | ICD-10-CM

## 2022-03-20 DIAGNOSIS — N1831 Chronic kidney disease, stage 3a: Secondary | ICD-10-CM

## 2022-03-23 DIAGNOSIS — M80021D Age-related osteoporosis with current pathological fracture, right humerus, subsequent encounter for fracture with routine healing: Secondary | ICD-10-CM | POA: Diagnosis not present

## 2022-03-23 DIAGNOSIS — M7591 Shoulder lesion, unspecified, right shoulder: Secondary | ICD-10-CM | POA: Diagnosis not present

## 2022-03-23 DIAGNOSIS — S42201A Unspecified fracture of upper end of right humerus, initial encounter for closed fracture: Secondary | ICD-10-CM | POA: Diagnosis not present

## 2022-03-23 DIAGNOSIS — Z9181 History of falling: Secondary | ICD-10-CM | POA: Diagnosis not present

## 2022-03-23 DIAGNOSIS — M19011 Primary osteoarthritis, right shoulder: Secondary | ICD-10-CM | POA: Diagnosis not present

## 2022-03-23 DIAGNOSIS — S46811D Strain of other muscles, fascia and tendons at shoulder and upper arm level, right arm, subsequent encounter: Secondary | ICD-10-CM | POA: Diagnosis not present

## 2022-03-23 DIAGNOSIS — W19XXXD Unspecified fall, subsequent encounter: Secondary | ICD-10-CM | POA: Diagnosis not present

## 2022-03-25 DIAGNOSIS — M19011 Primary osteoarthritis, right shoulder: Secondary | ICD-10-CM | POA: Diagnosis not present

## 2022-03-25 DIAGNOSIS — W19XXXD Unspecified fall, subsequent encounter: Secondary | ICD-10-CM | POA: Diagnosis not present

## 2022-03-25 DIAGNOSIS — S46811D Strain of other muscles, fascia and tendons at shoulder and upper arm level, right arm, subsequent encounter: Secondary | ICD-10-CM | POA: Diagnosis not present

## 2022-03-25 DIAGNOSIS — M7591 Shoulder lesion, unspecified, right shoulder: Secondary | ICD-10-CM | POA: Diagnosis not present

## 2022-03-25 DIAGNOSIS — S42201A Unspecified fracture of upper end of right humerus, initial encounter for closed fracture: Secondary | ICD-10-CM | POA: Diagnosis not present

## 2022-03-25 DIAGNOSIS — Z9181 History of falling: Secondary | ICD-10-CM | POA: Diagnosis not present

## 2022-03-25 DIAGNOSIS — M80021D Age-related osteoporosis with current pathological fracture, right humerus, subsequent encounter for fracture with routine healing: Secondary | ICD-10-CM | POA: Diagnosis not present

## 2022-03-26 DIAGNOSIS — W19XXXD Unspecified fall, subsequent encounter: Secondary | ICD-10-CM | POA: Diagnosis not present

## 2022-03-26 DIAGNOSIS — M80021D Age-related osteoporosis with current pathological fracture, right humerus, subsequent encounter for fracture with routine healing: Secondary | ICD-10-CM | POA: Diagnosis not present

## 2022-03-26 DIAGNOSIS — S46811D Strain of other muscles, fascia and tendons at shoulder and upper arm level, right arm, subsequent encounter: Secondary | ICD-10-CM | POA: Diagnosis not present

## 2022-03-26 DIAGNOSIS — M19011 Primary osteoarthritis, right shoulder: Secondary | ICD-10-CM | POA: Diagnosis not present

## 2022-03-26 DIAGNOSIS — Z9181 History of falling: Secondary | ICD-10-CM | POA: Diagnosis not present

## 2022-03-26 DIAGNOSIS — M7591 Shoulder lesion, unspecified, right shoulder: Secondary | ICD-10-CM | POA: Diagnosis not present

## 2022-03-26 DIAGNOSIS — S42201A Unspecified fracture of upper end of right humerus, initial encounter for closed fracture: Secondary | ICD-10-CM | POA: Diagnosis not present

## 2022-03-31 ENCOUNTER — Other Ambulatory Visit: Payer: Self-pay | Admitting: Family Medicine

## 2022-03-31 DIAGNOSIS — M80021D Age-related osteoporosis with current pathological fracture, right humerus, subsequent encounter for fracture with routine healing: Secondary | ICD-10-CM | POA: Diagnosis not present

## 2022-03-31 DIAGNOSIS — M19011 Primary osteoarthritis, right shoulder: Secondary | ICD-10-CM | POA: Diagnosis not present

## 2022-03-31 DIAGNOSIS — Z9181 History of falling: Secondary | ICD-10-CM | POA: Diagnosis not present

## 2022-03-31 DIAGNOSIS — S42201A Unspecified fracture of upper end of right humerus, initial encounter for closed fracture: Secondary | ICD-10-CM | POA: Diagnosis not present

## 2022-03-31 DIAGNOSIS — M7591 Shoulder lesion, unspecified, right shoulder: Secondary | ICD-10-CM | POA: Diagnosis not present

## 2022-03-31 DIAGNOSIS — S46811D Strain of other muscles, fascia and tendons at shoulder and upper arm level, right arm, subsequent encounter: Secondary | ICD-10-CM | POA: Diagnosis not present

## 2022-03-31 DIAGNOSIS — W19XXXD Unspecified fall, subsequent encounter: Secondary | ICD-10-CM | POA: Diagnosis not present

## 2022-04-01 DIAGNOSIS — S46811D Strain of other muscles, fascia and tendons at shoulder and upper arm level, right arm, subsequent encounter: Secondary | ICD-10-CM | POA: Diagnosis not present

## 2022-04-01 DIAGNOSIS — W19XXXD Unspecified fall, subsequent encounter: Secondary | ICD-10-CM | POA: Diagnosis not present

## 2022-04-01 DIAGNOSIS — Z9181 History of falling: Secondary | ICD-10-CM | POA: Diagnosis not present

## 2022-04-01 DIAGNOSIS — M7591 Shoulder lesion, unspecified, right shoulder: Secondary | ICD-10-CM | POA: Diagnosis not present

## 2022-04-01 DIAGNOSIS — M19011 Primary osteoarthritis, right shoulder: Secondary | ICD-10-CM | POA: Diagnosis not present

## 2022-04-01 DIAGNOSIS — S42201A Unspecified fracture of upper end of right humerus, initial encounter for closed fracture: Secondary | ICD-10-CM | POA: Diagnosis not present

## 2022-04-01 DIAGNOSIS — M80021D Age-related osteoporosis with current pathological fracture, right humerus, subsequent encounter for fracture with routine healing: Secondary | ICD-10-CM | POA: Diagnosis not present

## 2022-04-02 DIAGNOSIS — M80021D Age-related osteoporosis with current pathological fracture, right humerus, subsequent encounter for fracture with routine healing: Secondary | ICD-10-CM | POA: Diagnosis not present

## 2022-04-02 DIAGNOSIS — S46811D Strain of other muscles, fascia and tendons at shoulder and upper arm level, right arm, subsequent encounter: Secondary | ICD-10-CM | POA: Diagnosis not present

## 2022-04-02 DIAGNOSIS — W19XXXD Unspecified fall, subsequent encounter: Secondary | ICD-10-CM | POA: Diagnosis not present

## 2022-04-02 DIAGNOSIS — Z9181 History of falling: Secondary | ICD-10-CM | POA: Diagnosis not present

## 2022-04-02 DIAGNOSIS — M19011 Primary osteoarthritis, right shoulder: Secondary | ICD-10-CM | POA: Diagnosis not present

## 2022-04-02 DIAGNOSIS — S42201A Unspecified fracture of upper end of right humerus, initial encounter for closed fracture: Secondary | ICD-10-CM | POA: Diagnosis not present

## 2022-04-02 DIAGNOSIS — M7591 Shoulder lesion, unspecified, right shoulder: Secondary | ICD-10-CM | POA: Diagnosis not present

## 2022-04-03 DIAGNOSIS — W19XXXD Unspecified fall, subsequent encounter: Secondary | ICD-10-CM | POA: Diagnosis not present

## 2022-04-03 DIAGNOSIS — S46811D Strain of other muscles, fascia and tendons at shoulder and upper arm level, right arm, subsequent encounter: Secondary | ICD-10-CM | POA: Diagnosis not present

## 2022-04-03 DIAGNOSIS — M80021D Age-related osteoporosis with current pathological fracture, right humerus, subsequent encounter for fracture with routine healing: Secondary | ICD-10-CM | POA: Diagnosis not present

## 2022-04-03 DIAGNOSIS — M19011 Primary osteoarthritis, right shoulder: Secondary | ICD-10-CM | POA: Diagnosis not present

## 2022-04-03 DIAGNOSIS — S42201A Unspecified fracture of upper end of right humerus, initial encounter for closed fracture: Secondary | ICD-10-CM | POA: Diagnosis not present

## 2022-04-03 DIAGNOSIS — Z9181 History of falling: Secondary | ICD-10-CM | POA: Diagnosis not present

## 2022-04-03 DIAGNOSIS — M7591 Shoulder lesion, unspecified, right shoulder: Secondary | ICD-10-CM | POA: Diagnosis not present

## 2022-04-06 DIAGNOSIS — M80021D Age-related osteoporosis with current pathological fracture, right humerus, subsequent encounter for fracture with routine healing: Secondary | ICD-10-CM | POA: Diagnosis not present

## 2022-04-06 DIAGNOSIS — W19XXXD Unspecified fall, subsequent encounter: Secondary | ICD-10-CM | POA: Diagnosis not present

## 2022-04-06 DIAGNOSIS — M7591 Shoulder lesion, unspecified, right shoulder: Secondary | ICD-10-CM | POA: Diagnosis not present

## 2022-04-06 DIAGNOSIS — Z9181 History of falling: Secondary | ICD-10-CM | POA: Diagnosis not present

## 2022-04-06 DIAGNOSIS — S46811D Strain of other muscles, fascia and tendons at shoulder and upper arm level, right arm, subsequent encounter: Secondary | ICD-10-CM | POA: Diagnosis not present

## 2022-04-06 DIAGNOSIS — M19011 Primary osteoarthritis, right shoulder: Secondary | ICD-10-CM | POA: Diagnosis not present

## 2022-04-06 DIAGNOSIS — S42201A Unspecified fracture of upper end of right humerus, initial encounter for closed fracture: Secondary | ICD-10-CM | POA: Diagnosis not present

## 2022-04-08 DIAGNOSIS — M7591 Shoulder lesion, unspecified, right shoulder: Secondary | ICD-10-CM | POA: Diagnosis not present

## 2022-04-08 DIAGNOSIS — S42201A Unspecified fracture of upper end of right humerus, initial encounter for closed fracture: Secondary | ICD-10-CM | POA: Diagnosis not present

## 2022-04-08 DIAGNOSIS — Z9181 History of falling: Secondary | ICD-10-CM | POA: Diagnosis not present

## 2022-04-08 DIAGNOSIS — W19XXXD Unspecified fall, subsequent encounter: Secondary | ICD-10-CM | POA: Diagnosis not present

## 2022-04-08 DIAGNOSIS — M19011 Primary osteoarthritis, right shoulder: Secondary | ICD-10-CM | POA: Diagnosis not present

## 2022-04-08 DIAGNOSIS — S46811D Strain of other muscles, fascia and tendons at shoulder and upper arm level, right arm, subsequent encounter: Secondary | ICD-10-CM | POA: Diagnosis not present

## 2022-04-08 DIAGNOSIS — M80021D Age-related osteoporosis with current pathological fracture, right humerus, subsequent encounter for fracture with routine healing: Secondary | ICD-10-CM | POA: Diagnosis not present

## 2022-04-27 ENCOUNTER — Telehealth: Payer: Self-pay | Admitting: Family Medicine

## 2022-04-27 NOTE — Telephone Encounter (Signed)
Left message for patient to call back and schedule Medicare Annual Wellness Visit (AWV) to be completed by video or phone.  ? ?Last AWV: 08/30/2018 ? ?Please schedule at anytime with  ?[facility]    ? ?45 minute appointment ? ?Any questions, please contact me at [phone]  ?  ?  ?

## 2022-05-04 ENCOUNTER — Ambulatory Visit (INDEPENDENT_AMBULATORY_CARE_PROVIDER_SITE_OTHER): Payer: Medicare Other | Admitting: *Deleted

## 2022-05-04 DIAGNOSIS — G3184 Mild cognitive impairment, so stated: Secondary | ICD-10-CM

## 2022-05-04 DIAGNOSIS — Z8781 Personal history of (healed) traumatic fracture: Secondary | ICD-10-CM

## 2022-05-04 DIAGNOSIS — W19XXXA Unspecified fall, initial encounter: Secondary | ICD-10-CM

## 2022-05-04 DIAGNOSIS — I10 Essential (primary) hypertension: Secondary | ICD-10-CM

## 2022-05-05 NOTE — Chronic Care Management (AMB) (Signed)
?Chronic Care Management  ? ? Clinical Social Work Note ? ?05/05/2022 ?Name: Amanda Salas MRN: [id number] DOB: 11/09/1927 ? ?Amanda Salas is a 88 y.o. year old female who is a primary care patient of Tower, Wynelle Fanny, MD. The CCM team was consulted to assist the patient with chronic disease management and/or care coordination needs related to: Level of Care Concerns and Caregiver Stress.  ? ?Engaged with patient by telephone for follow up visit in response to provider referral for social work chronic care management and care coordination services.  ? ?Consent to Services:  ?The patient was given information about Chronic Care Management services, agreed to services, and gave verbal consent prior to initiation of services.  Please see initial visit note for detailed documentation.  ? ?Patient agreed to services and consent obtained.  ? ?Assessment: Review of patient past medical history, allergies, medications, and health status, including review of relevant consultants reports was performed today as part of a comprehensive evaluation and provision of chronic care management and care coordination services.    ? ?SDOH (Social Determinants of Health) assessments and interventions performed:   ? ?Advanced Directives Status: Not addressed in this encounter. ? ?CCM Care Plan ? ?Allergies  ?Allergen Reactions  ? Ace Inhibitors   ?  REACTION: cough  ? Alendronate Sodium   ?  REACTION: pill got stuck  ? ? ?Outpatient Encounter Medications as of 05/04/2022  ?Medication Sig  ? acetaminophen (TYLENOL) 325 MG tablet Take 2 tablets (650 mg total) by mouth every 6 (six) hours as needed for mild pain (pain score 1-3 or temp > 100.5). (Patient taking differently: Take 650 mg by mouth every 6 (six) hours as needed for mild pain. Also takes 2 tablets at bedtime)  ? alum & mag hydroxide-simeth (MAALOX/MYLANTA) 200-200-20 MG/5ML suspension Take 30 mLs by mouth every 4 (four) hours as needed for indigestion.  ? Calcium  Carbonate-Vitamin D (CALCIUM-D PO) Take 1 tablet by mouth daily.  ? Cholecalciferol (VITAMIN D3 PO) Take 1,000 Units daily by mouth.   ? docusate sodium (COLACE) 100 MG capsule Take 1 capsule (100 mg total) by mouth 2 (two) times daily.  ? donepezil (ARICEPT) 10 MG tablet Take 1 tablet (10 mg total) by mouth at bedtime.  ? losartan (COZAAR) 50 MG tablet TAKE 1 TABLET BY MOUTH ONCE A DAY  ? melatonin 3 MG TABS tablet Take 1 tablet (3 mg total) by mouth at bedtime as needed (sleep).  ? memantine (NAMENDA) 5 MG tablet Take 1 tablet (5 mg total) by mouth 2 (two) times daily.  ? metoprolol succinate (TOPROL-XL) 50 MG 24 hr tablet TAKE 1 TABLET DAILY WITH OR IMMEDIATELY FOLLOWING A MEAL  ? simvastatin (ZOCOR) 20 MG tablet TAKE 1 TABLET BY MOUTH EVERY NIGHT AT BEDTIME  ? ?No facility-administered encounter medications on file as of 05/04/2022.  ? ? ?Patient Active Problem List  ? Diagnosis Date Noted  ? Closed comminuted intertrochanteric fracture of left femur ([place]) 06/09/2020  ? Stage 3a chronic kidney disease ([place]) 06/09/2020  ? Elevated glucose 06/09/2020  ? Medicare annual wellness visit, subsequent 08/30/2018  ? Routine general medical examination at a health care facility 08/30/2018  ? Advance care planning 08/30/2018  ? H/O fracture of hip 10/30/2017  ? Dystrophic nail 04/23/2015  ? Encounter for Medicare annual wellness exam 04/17/2014  ? Colon cancer screening 04/17/2014  ? Anemia 04/17/2014  ? History of vertebral compression fracture 12/26/2013  ? Low back pain 11/22/2013  ? Mycotic toenails  11/04/2011  ? Dementia ([place]) 12/29/2010  ? Vitamin D deficiency 07/01/2010  ? FALL, HX OF 07/01/2010  ? Hyperlipidemia 08/10/2008  ? Essential hypertension 08/10/2008  ? ALLERGIC RHINITIS 08/10/2008  ? GERD 08/10/2008  ? Osteoporosis 08/10/2008  ? ? ?Conditions to be addressed/monitored: Osteoporosis; Level of care concerns and Limited access to caregiver ? ?Care Plan : LCSW  Plan of Care  ?Updates made by Deirdre Peer,  LCSW since 05/05/2022 12:00 AM  ?  ? ?Problem: Long-Term Care Planning   ?  ? ?Long-Range Goal: Effective Long-Term Care Planning Completed 05/05/2022  ?Start Date: 02/13/2022  ?Expected End Date: 05/04/2022  ?This Visit's Progress: On track  ?Recent Progress: On track  ?Priority: High  ?Note:   ?Current Barriers:  ?Care Coordination needs related to Dementia: Dementia   ? ?CSW Clinical Goal(s):  ?Caregiver  will work with Education officer, museum to address concerns related to long term care needs/dementia  through collaboration with [employer] representative, provider, and care team.  ? ?Interventions: ?05/04/22- CSW spoke with pt's granddaughter, Amanda Salas, who pt lives with. Per Amanda Salas,  the CAPS caregiver support continues and all is going well.  She reports pt has completed her HH therapies and was quite pleased with the team. She denies nay further needs or issues for CSW. She will reach out if further needs arise.  ? ? pt is not alone much and they have cameras in place also.  ?Family prefers keeping pt at home- discussed options to include ALF, Adult Day Care, etc. ?Also encouraged family to research pt's UHC advantage plan benefits and to take advantage of all it offers- Ucard, transportation, etc. ?1:1 collaboration with primary care provider regarding development and update of comprehensive plan of care as evidenced by provider attestation and co-signature ?Inter-disciplinary care team collaboration (see longitudinal plan of care) ?Evaluation of current treatment plan related to  self management and patient's adherence to plan as established by provider ?Review resources, discussed options and provided patient information about  ?Adult Day Care, ALF/memory care, UHC benefits ? ? ? ?Level of Care Concerns in a patient with Dementia:  (Status: New goal.) ?Current level of care: home with other family or significant other(s): family member: granddaughter, daughter and others ?Evaluation of patient safety in current living  environment ?Solution-Focused Strategies employed:  ?Active listening / Reflection utilized  ?Emotional Support Provided ?Problem Solving /Task Center strategies reviewed ?Caregiver stress acknowledged  ?Consideration of in-home help encouraged : options discussed ?Assessed needs, level of care concerns, how currently meeting needs and barriers to care ?Family prefers to keep pt at home ?Pt's daughter, Amanda Salas, is HCPOA ?CAPS program support ? ?Patient Self-Care Activities: ?Call your insurance provider to discuss transportation options ?Call your insurance provider for more information about your Enhanced Benefits  ?Continue with compliance of taking medication  ?Continue with in-home care/support through CAPS program ? ? ? ?  ?  ?  ? ?Follow Up Plan: Client will follow up if further needs arise ?     ?Eduard Clos MSW, LCSW ?Licensed Clinical Social Worker ?[place]   ?[phone]  ? ? ?

## 2022-05-05 NOTE — Patient Instructions (Signed)
Visit Information ? ?Thank you for taking time to visit with me today. Please don't hesitate to contact me if I can be of assistance to you. ?Please call the care guide team at [phone] if you need to cancel or reschedule your appointment.  ? ?If you are experiencing a Mental Health or [place] or need someone to talk to, please call 911  ? ?The patient verbalized understanding of instructions, educational materials, and care plan provided today and declined offer to receive copy of patient instructions, educational materials, and care plan.  ? ?Eduard Clos MSW, LCSW ?Licensed Clinical Social Worker ?[place]   ?[phone]  ?

## 2022-05-14 ENCOUNTER — Telehealth: Payer: Self-pay | Admitting: Family Medicine

## 2022-05-14 NOTE — Telephone Encounter (Signed)
Left message for patient to call back and schedule Medicare Annual Wellness Visit (AWV) either virtually or phone   Last AWV 08/30/18 ; please schedule at anytime with health coach    I left my direct # [phone]

## 2022-05-20 DIAGNOSIS — M8580 Other specified disorders of bone density and structure, unspecified site: Secondary | ICD-10-CM

## 2022-05-22 ENCOUNTER — Ambulatory Visit (INDEPENDENT_AMBULATORY_CARE_PROVIDER_SITE_OTHER): Payer: Medicare Other

## 2022-05-22 VITALS — Ht 60.0 in | Wt 115.0 lb

## 2022-05-22 DIAGNOSIS — Z0000 Encounter for general adult medical examination without abnormal findings: Secondary | ICD-10-CM

## 2022-05-22 NOTE — Patient Instructions (Signed)
Amanda Salas , Thank you for taking time to come for your Medicare Wellness Visit. I appreciate your ongoing commitment to your health goals. Please review the following plan we discussed and let me know if I can assist you in the future.   Screening recommendations/referrals: Colonoscopy: not required Mammogram: not required Bone Density: not required Recommended yearly ophthalmology/optometry visit for glaucoma screening and checkup Recommended yearly dental visit for hygiene and checkup  Vaccinations: Influenza vaccine: due 07/21/2022 Pneumococcal vaccine: completed 06/01/2014 Tdap vaccine: decline Shingles vaccine: decline   Covid-19:02/14/2020, 01/24/2020  Advanced directives: Please bring a copy of your POA (Power of Attorney) and/or Living Will to your next appointment.   Conditions/risks identified: none  Next appointment: Follow up in one year for your annual wellness visit    Preventive Care 65 Years and Older, Female Preventive care refers to lifestyle choices and visits with your health care provider that can promote health and wellness. What does preventive care include? A yearly physical exam. This is also called an annual well check. Dental exams once or twice a year. Routine eye exams. Ask your health care provider how often you should have your eyes checked. Personal lifestyle choices, including: Daily care of your teeth and gums. Regular physical activity. Eating a healthy diet. Avoiding tobacco and drug use. Limiting alcohol use. Practicing safe sex. Taking low-dose aspirin every day. Taking vitamin and mineral supplements as recommended by your health care provider. What happens during an annual well check? The services and screenings done by your health care provider during your annual well check will depend on your age, overall health, lifestyle risk factors, and family history of disease. Counseling  Your health care provider may ask you questions about  your: Alcohol use. Tobacco use. Drug use. Emotional well-being. Home and relationship well-being. Sexual activity. Eating habits. History of falls. Memory and ability to understand (cognition). Work and work Statistician. Reproductive health. Screening  You may have the following tests or measurements: Height, weight, and BMI. Blood pressure. Lipid and cholesterol levels. These may be checked every 5 years, or more frequently if you are over 81 years old. Skin check. Lung cancer screening. You may have this screening every year starting at age 54 if you have a 30-pack-year history of smoking and currently smoke or have quit within the past 15 years. Fecal occult blood test (FOBT) of the stool. You may have this test every year starting at age 34. Flexible sigmoidoscopy or colonoscopy. You may have a sigmoidoscopy every 5 years or a colonoscopy every 10 years starting at age 67. Hepatitis C blood test. Hepatitis B blood test. Sexually transmitted disease (STD) testing. Diabetes screening. This is done by checking your blood sugar (glucose) after you have not eaten for a while (fasting). You may have this done every 1-3 years. Bone density scan. This is done to screen for osteoporosis. You may have this done starting at age 13. Mammogram. This may be done every 1-2 years. Talk to your health care provider about how often you should have regular mammograms. Talk with your health care provider about your test results, treatment options, and if necessary, the need for more tests. Vaccines  Your health care provider may recommend certain vaccines, such as: Influenza vaccine. This is recommended every year. Tetanus, diphtheria, and acellular pertussis (Tdap, Td) vaccine. You may need a Td booster every 10 years. Zoster vaccine. You may need this after age 35. Pneumococcal 13-valent conjugate (PCV13) vaccine. One dose is recommended after age 62.  Pneumococcal polysaccharide (PPSV23) vaccine.  One dose is recommended after age 20. Talk to your health care provider about which screenings and vaccines you need and how often you need them. This information is not intended to replace advice given to you by your health care provider. Make sure you discuss any questions you have with your health care provider. Document Released: 01/03/2016 Document Revised: 08/26/2016 Document Reviewed: 10/08/2015 Elsevier Interactive Patient Education  2017 [employer] Prevention in the Home Falls can cause injuries. They can happen to people of all ages. There are many things you can do to make your home safe and to help prevent falls. What can I do on the outside of my home? Regularly fix the edges of walkways and driveways and fix any cracks. Remove anything that might make you trip as you walk through a door, such as a raised step or threshold. Trim any bushes or trees on the path to your home. Use bright outdoor lighting. Clear any walking paths of anything that might make someone trip, such as rocks or tools. Regularly check to see if handrails are loose or broken. Make sure that both sides of any steps have handrails. Any raised decks and porches should have guardrails on the edges. Have any leaves, snow, or ice cleared regularly. Use sand or salt on walking paths during winter. Clean up any spills in your garage right away. This includes oil or grease spills. What can I do in the bathroom? Use night lights. Install grab bars by the toilet and in the tub and shower. Do not use towel bars as grab bars. Use non-skid mats or decals in the tub or shower. If you need to sit down in the shower, use a plastic, non-slip stool. Keep the floor dry. Clean up any water that spills on the floor as soon as it happens. Remove soap buildup in the tub or shower regularly. Attach bath mats securely with double-sided non-slip rug tape. Do not have throw rugs and other things on the floor that can make  you trip. What can I do in the bedroom? Use night lights. Make sure that you have a light by your bed that is easy to reach. Do not use any sheets or blankets that are too big for your bed. They should not hang down onto the floor. Have a firm chair that has side arms. You can use this for support while you get dressed. Do not have throw rugs and other things on the floor that can make you trip. What can I do in the kitchen? Clean up any spills right away. Avoid walking on wet floors. Keep items that you use a lot in easy-to-reach places. If you need to reach something above you, use a strong step stool that has a grab bar. Keep electrical cords out of the way. Do not use floor polish or wax that makes floors slippery. If you must use wax, use non-skid floor wax. Do not have throw rugs and other things on the floor that can make you trip. What can I do with my stairs? Do not leave any items on the stairs. Make sure that there are handrails on both sides of the stairs and use them. Fix handrails that are broken or loose. Make sure that handrails are as long as the stairways. Check any carpeting to make sure that it is firmly attached to the stairs. Fix any carpet that is loose or worn. Avoid having throw rugs at the  top or bottom of the stairs. If you do have throw rugs, attach them to the floor with carpet tape. Make sure that you have a light switch at the top of the stairs and the bottom of the stairs. If you do not have them, ask someone to add them for you. What else can I do to help prevent falls? Wear shoes that: Do not have high heels. Have rubber bottoms. Are comfortable and fit you well. Are closed at the toe. Do not wear sandals. If you use a stepladder: Make sure that it is fully opened. Do not climb a closed stepladder. Make sure that both sides of the stepladder are locked into place. Ask someone to hold it for you, if possible. Clearly mark and make sure that you can  see: Any grab bars or handrails. First and last steps. Where the edge of each step is. Use tools that help you move around (mobility aids) if they are needed. These include: Canes. Walkers. Scooters. Crutches. Turn on the lights when you go into a dark area. Replace any light bulbs as soon as they burn out. Set up your furniture so you have a clear path. Avoid moving your furniture around. If any of your floors are uneven, fix them. If there are any pets around you, be aware of where they are. Review your medicines with your doctor. Some medicines can make you feel dizzy. This can increase your chance of falling. Ask your doctor what other things that you can do to help prevent falls. This information is not intended to replace advice given to you by your health care provider. Make sure you discuss any questions you have with your health care provider. Document Released: 10/03/2009 Document Revised: 05/14/2016 Document Reviewed: 01/11/2015 Elsevier Interactive Patient Education  2017 [employer].

## 2022-05-22 NOTE — Progress Notes (Signed)
I connected with Amanda Salas today by telephone and verified that I am speaking with the correct person using two identifiers. Location patient: home Location provider: work Persons participating in the virtual visit: Amanda Salas, [name] (daughter), Glenna Durand LPN.   I discussed the limitations, risks, security and privacy concerns of performing an evaluation and management service by telephone and the availability of in person appointments. I also discussed with the patient that there may be a patient responsible charge related to this service. The patient expressed understanding and verbally consented to this telephonic visit.    Interactive audio and video telecommunications were attempted between this provider and patient, however failed, due to patient having technical difficulties OR patient did not have access to video capability.  We continued and completed visit with audio only.     Vital signs may be patient reported or missing.  Subjective:   Amanda Salas is a 88 y.o. female who presents for Medicare Annual (Subsequent) preventive examination.  Review of Systems     Cardiac Risk Factors include: advanced age (>76mn, >>59women);dyslipidemia;hypertension     Objective:    Today's Vitals   05/22/22 1327  Weight: 115 lb (52.2 kg)  Height: 5' (1.524 m)   Body mass index is 22.46 kg/m.     05/22/2022    1:31 PM 06/09/2020   11:05 AM 06/09/2020    6:31 AM 11/04/2017   10:56 AM 08/28/2016   11:25 AM  Advanced Directives  Does Patient Have a Medical Advance Directive? Yes Yes No No Yes  Type of Advance Directive [place] facility DNR (pink MOST or yellow form);[place]   [place] will  Does patient want to make changes to medical advance directive?  No - Guardian declined   No - Patient declined  Copy of [place] Chart? No - copy requested Yes - validated  most recent copy scanned in chart (See row information)   No - copy requested  Would patient like information on creating a medical advance directive?  No - Guardian declined No - Patient declined No - Patient declined     Current Medications (verified) Outpatient Encounter Medications as of 05/22/2022  Medication Sig   acetaminophen (TYLENOL) 325 MG tablet Take 2 tablets (650 mg total) by mouth every 6 (six) hours as needed for mild pain (pain score 1-3 or temp > 100.5). (Patient taking differently: Take 650 mg by mouth every 6 (six) hours as needed for mild pain. Also takes 2 tablets at bedtime)   alum & mag hydroxide-simeth (MAALOX/MYLANTA) 200-200-20 MG/5ML suspension Take 30 mLs by mouth every 4 (four) hours as needed for indigestion.   Calcium Carbonate-Vitamin D (CALCIUM-D PO) Take 1 tablet by mouth daily.   Cholecalciferol (VITAMIN D3 PO) Take 1,000 Units daily by mouth.    docusate sodium (COLACE) 100 MG capsule Take 1 capsule (100 mg total) by mouth 2 (two) times daily.   donepezil (ARICEPT) 10 MG tablet Take 1 tablet (10 mg total) by mouth at bedtime.   losartan (COZAAR) 50 MG tablet TAKE 1 TABLET BY MOUTH ONCE A DAY   melatonin 3 MG TABS tablet Take 1 tablet (3 mg total) by mouth at bedtime as needed (sleep).   memantine (NAMENDA) 5 MG tablet Take 1 tablet (5 mg total) by mouth 2 (two) times daily.   metoprolol succinate (TOPROL-XL) 50 MG 24 hr tablet TAKE 1 TABLET DAILY WITH OR IMMEDIATELY  FOLLOWING A MEAL   simvastatin (ZOCOR) 20 MG tablet TAKE 1 TABLET BY MOUTH EVERY NIGHT AT BEDTIME   No facility-administered encounter medications on file as of 05/22/2022.    Allergies (verified) Ace inhibitors and Alendronate sodium   History: Past Medical History:  Diagnosis Date   Allergy    Arthritis    Cataract    Dementia (HCC)    Disorder of bone and cartilage, unspecified    GERD (gastroesophageal reflux disease)    Hepatitis    Hyperlipidemia    Hypertension     Thrombocytopenia, unspecified (HCC)    Past Surgical History:  Procedure Laterality Date   CARDIAC CATHETERIZATION     ESOPHAGOGASTRODUODENOSCOPY     INTRAMEDULLARY (IM) NAIL INTERTROCHANTERIC Left 06/09/2020   INTRAMEDULLARY (IM) NAIL INTERTROCHANTERIC Left 06/09/2020   Procedure: INTRAMEDULLARY (IM) NAIL INTERTROCHANTRIC;  Surgeon: Leandrew Koyanagi, MD;  Location: [facility];  Service: Orthopedics;  Laterality: Left;   TOTAL HIP ARTHROPLASTY Right 10/31/2017   Procedure: TOTAL HIP ARTHROPLASTY DIRECT ANTERIOR APPROACH;  Surgeon: Leandrew Koyanagi, MD;  Location: [facility];  Service: Orthopedics;  Laterality: Right;   Family History  Problem Relation Age of Onset   Alzheimer's disease Mother    Social History   Socioeconomic History   Marital status: Divorced    Spouse name: Not on file   Number of children: 1   Years of education: Not on file   Highest education level: Not on file  Occupational History   Occupation: retired  Tobacco Use   Smoking status: Never   Smokeless tobacco: Never  Vaping Use   Vaping Use: Never used  Substance and Sexual Activity   Alcohol use: No    Alcohol/week: 0.0 standard drinks   Drug use: No   Sexual activity: Not Currently  Other Topics Concern   Not on file  Social History Narrative   does word puzzles to keep memory sharp    enjoys reading   is active but no longer drives    Social Determinants of Radio broadcast assistant Strain: Low Risk    Difficulty of Paying Living Expenses: Not hard at all  Food Insecurity: No Food Insecurity   Worried About Charity fundraiser in the Last Year: Never true   [place] in the Last Year: Never true  Transportation Needs: No Transportation Needs   Lack of Transportation (Medical): No   Lack of Transportation (Non-Medical): No  Physical Activity: Inactive   Days of Exercise per Week: 0 days   Minutes of Exercise per Session: 0 min  Stress: No Stress Concern Present   Feeling of Stress : Not at all   Social Connections: Not on file    Tobacco Counseling Counseling given: Not Answered   Clinical Intake:  Pre-visit preparation completed: Yes  Pain : No/denies pain     Nutritional Status: BMI of 19-24  Normal Nutritional Risks: None Diabetes: No  How often do you need to have someone help you when you read instructions, pamphlets, or other written materials from your doctor or pharmacy?: 5 - Always  Diabetic? No   Interpreter Needed?: No  Information entered by :: NAllen LPN   Activities of Daily Living    05/22/2022    1:33 PM 09/02/2021    2:46 PM  In your present state of health, do you have any difficulty performing the following activities:  Hearing? 0 1  Vision? 0 0  Difficulty concentrating or making decisions? 1 1  Walking or climbing stairs? 1 1  Dressing or bathing? 0 0  Doing errands, shopping? 1 1  Preparing Food and eating ? Y   Using the Toilet? N   In the past six months, have you accidently leaked urine? Y   Do you have problems with loss of bowel control? N   Managing your Medications? Y   Managing your Finances? Y   Housekeeping or managing your Housekeeping? Y     Patient Care Team: Tower, Wynelle Fanny, MD as PCP - General  Indicate any recent Medical Services you may have received from other than [facility] providers in the past year (date may be approximate).     Assessment:   This is a routine wellness examination for Tyjae.  Hearing/Vision screen Vision Screening - Comments:: Regular eye exams,   Dietary issues and exercise activities discussed: Current Exercise Habits: The patient does not participate in regular exercise at present   Goals Addressed             This Visit's Progress    Patient Stated       05/22/2022, drink more water       Depression Screen    05/22/2022    1:32 PM 09/02/2021    2:46 PM 09/19/2019    6:18 PM 08/30/2018    2:21 PM 08/28/2016   11:32 AM 04/23/2015    3:07 PM 04/17/2014    4:00 PM  PHQ 2/9 Scores   PHQ - 2 Score 0 0 0 0 0 0 0    Fall Risk    05/22/2022    1:32 PM 08/30/2018    2:21 PM 08/28/2016   11:32 AM 04/23/2015    3:07 PM 04/17/2014    4:00 PM  [place] in the past year? 0 No No No Yes  Number falls in past yr: 0    2 or more  Injury with Fall? 0      Risk Factor Category      High Fall Risk  Risk for fall due to : Impaired balance/gait;Impaired mobility;Medication side effect      Follow up Falls evaluation completed;Education provided;Falls prevention discussed        FALL RISK PREVENTION PERTAINING TO THE HOME:  Any stairs in or around the home? No  If so, are there any without handrails?  N/a Home free of loose throw rugs in walkways, pet beds, electrical cords, etc? Yes  Adequate lighting in your home to reduce risk of falls? Yes   ASSISTIVE DEVICES UTILIZED TO PREVENT FALLS:  Life alert? No  Use of a cane, walker or w/c? Yes  Grab bars in the bathroom? Yes  Shower chair or bench in shower? Yes  Elevated toilet seat or a handicapped toilet? Yes   TIMED UP AND GO:  Was the test performed? No .       Cognitive Function:    08/28/2016   11:50 AM  MMSE - Mini Mental State Exam  Orientation to time 0  Orientation to time comments pt was unable to provide month or year  Orientation to Place 5  Registration 3  Attention/ Calculation 0  Recall 0  Recall-comments pt was unable to recall 3 of 3 words  Language- name 2 objects 0  Language- repeat 1  Language- follow 3 step command 2  Language- follow 3 step command-comments pt was unable to follow 1 step of 3 step command  Language- read & follow direction 0  Write a sentence 0  Copy design 0  Total score 11        Immunizations Immunization History  Administered Date(s) Administered   Fluad Quad(high Dose 65+) 10/26/2019   Influenza Split 11/04/2011, 03/03/2013   Influenza Whole 10/04/2007, 12/06/2008, 10/21/2009, 10/21/2010   Influenza,inj,Quad PF,6+ Mos 11/22/2013, 10/30/2014,  08/28/2016, 08/30/2018   PFIZER(Purple Top)SARS-COV-2 Vaccination 01/24/2020, 02/14/2020   Pneumococcal Conjugate-13 06/01/2014   Pneumococcal Polysaccharide-23 11/20/2005   Td 09/19/1998, 12/11/2009    TDAP status: Due, Education has been provided regarding the importance of this vaccine. Advised may receive this vaccine at local pharmacy or Health Dept. Aware to provide a copy of the vaccination record if obtained from local pharmacy or Health Dept. Verbalized acceptance and understanding.  Flu Vaccine status: Up to date  Pneumococcal vaccine status: Up to date  Covid-19 vaccine status: Completed vaccines  Qualifies for Shingles Vaccine? Yes   Zostavax completed No   Shingrix Completed?: No.    Education has been provided regarding the importance of this vaccine. Patient has been advised to call insurance company to determine out of pocket expense if they have not yet received this vaccine. Advised may also receive vaccine at local pharmacy or Health Dept. Verbalized acceptance and understanding.  Screening Tests Health Maintenance  Topic Date Due   COVID-19 Vaccine (3 - Booster for Pfizer series) 06/07/2022 (Originally 04/10/2020)   Zoster Vaccines- Shingrix (1 of 2) 08/22/2022 (Originally 12/01/1979)   TETANUS/TDAP  05/23/2023 (Originally 12/12/2019)   DEXA SCAN  08/28/2026 (Originally 11/30/1994)   INFLUENZA VACCINE  07/21/2022   Pneumonia Vaccine 49+ Years old  Completed   HPV VACCINES  Aged Out    Health Maintenance  There are no preventive care reminders to display for this patient.   Colorectal cancer screening: No longer required.   Mammogram status: No longer required due to age.  Bone Density status: decline  Lung Cancer Screening: (Low Dose CT Chest recommended if Age 13-80 years, 30 pack-year currently smoking OR have quit w/in 15years.) does not qualify.   Lung Cancer Screening Referral: no  Additional Screening:  Hepatitis C Screening: does not qualify;    Vision Screening: Recommended annual ophthalmology exams for early detection of glaucoma and other disorders of the eye. Is the patient up to date with their annual eye exam?  Yes  Who is the provider or what is the name of the office in which the patient attends annual eye exams? Can't remember name If pt is not established with a provider, would they like to be referred to a provider to establish care? No .   Dental Screening: Recommended annual dental exams for proper oral hygiene  Community Resource Referral / Chronic Care Management: CRR required this visit?  No   CCM required this visit?  No      Plan:     I have personally reviewed and noted the following in the patient's chart:   Medical and social history Use of alcohol, tobacco or illicit drugs  Current medications and supplements including opioid prescriptions.  Functional ability and status Nutritional status Physical activity Advanced directives List of other physicians Hospitalizations, surgeries, and ER visits in previous 12 months Vitals Screenings to include cognitive, depression, and falls Referrals and appointments  In addition, I have reviewed and discussed with patient certain preventive protocols, quality metrics, and best practice recommendations. A written personalized care plan for preventive services as well as general preventive health recommendations were provided to patient.     Marissa Calamity  Zenia Resides, LPN   08/27/2835   Nurse Notes: Questions answered by daughter [name]. Patient has a diagnosis of dementia. 6 CIT not administered.  Due to this being a virtual visit, the after visit summary with patients personalized plan was offered to patient via mail or my-chart. Patient would like to access on my-chart

## 2022-09-08 ENCOUNTER — Other Ambulatory Visit: Payer: Self-pay | Admitting: Family Medicine

## 2022-09-08 NOTE — Telephone Encounter (Signed)
Pt is due for part 2 CPE (labs prior) or at least a f/u: med refill appt., please call and schedule with family and then route refill request back to me, Thanks

## 2022-09-08 NOTE — Telephone Encounter (Signed)
Scheduled pt for f/u for 09/22/22

## 2022-09-22 ENCOUNTER — Ambulatory Visit: Payer: Medicare Other | Admitting: Family Medicine

## 2022-09-23 ENCOUNTER — Encounter: Payer: Self-pay | Admitting: Family Medicine

## 2022-09-23 ENCOUNTER — Ambulatory Visit (INDEPENDENT_AMBULATORY_CARE_PROVIDER_SITE_OTHER): Payer: Medicare Other | Admitting: Family Medicine

## 2022-09-23 VITALS — BP 126/68 | HR 71 | Temp 98.2°F | Ht 60.0 in | Wt 121.0 lb

## 2022-09-23 DIAGNOSIS — E7800 Pure hypercholesterolemia, unspecified: Secondary | ICD-10-CM

## 2022-09-23 DIAGNOSIS — F03B4 Unspecified dementia, moderate, with anxiety: Secondary | ICD-10-CM

## 2022-09-23 DIAGNOSIS — Z23 Encounter for immunization: Secondary | ICD-10-CM | POA: Diagnosis not present

## 2022-09-23 DIAGNOSIS — E559 Vitamin D deficiency, unspecified: Secondary | ICD-10-CM | POA: Diagnosis not present

## 2022-09-23 DIAGNOSIS — I10 Essential (primary) hypertension: Secondary | ICD-10-CM | POA: Diagnosis not present

## 2022-09-23 DIAGNOSIS — R7309 Other abnormal glucose: Secondary | ICD-10-CM

## 2022-09-23 DIAGNOSIS — M8000XS Age-related osteoporosis with current pathological fracture, unspecified site, sequela: Secondary | ICD-10-CM

## 2022-09-23 DIAGNOSIS — E2839 Other primary ovarian failure: Secondary | ICD-10-CM

## 2022-09-23 DIAGNOSIS — N1831 Chronic kidney disease, stage 3a: Secondary | ICD-10-CM | POA: Diagnosis not present

## 2022-09-23 MED ORDER — MEMANTINE HCL 5 MG PO TABS: 5.0000 mg | ORAL_TABLET | Freq: Two times a day (BID) | ORAL | 11 refills | Status: DC

## 2022-09-23 MED ORDER — DONEPEZIL HCL 10 MG PO TABS: 10.0000 mg | ORAL_TABLET | Freq: Every day | ORAL | 11 refills | Status: DC

## 2022-09-23 NOTE — Patient Instructions (Addendum)
Encourage fluids  Offer some more lemonade, tea , things she likes  Provide lots of reminders to eat and drink   Let's stop the simvastatin (zocor) This may add to the leg pain/aches   Call the [place] [facility] to schedule a bone density test   Take a look at the handout on alendronate  We would consider this for osteoporosis depending on the results   Flu shot today   Labs today

## 2022-09-23 NOTE — Assessment & Plan Note (Signed)
a1c ordered  Diet is not limited due to age

## 2022-09-23 NOTE — Assessment & Plan Note (Signed)
H/o fx spine/ hips and shoulder  Is prone to fall at adv age  Disc fall prev, walker , has had PT  dexa ordered Would be open to trial of alendronate (given info) dep on results  No dental issues No recent GERD Taking ca and D Wt bearing is limited  Handouts given

## 2022-09-23 NOTE — Assessment & Plan Note (Signed)
D level today  Taking 1000 iu D3 daily  Has OP with fractures

## 2022-09-23 NOTE — Assessment & Plan Note (Signed)
Lab today  Discussed strategies for hydration, offering liquids she likes

## 2022-09-23 NOTE — Assessment & Plan Note (Signed)
bp in fair control at this time  BP Readings from Last 1 Encounters:  09/23/22 126/68   No changes needed Most recent labs reviewed  Disc lifstyle change with low sodium diet and exercise  Losartan 50 mg daily  Metoprolol xl 50 mg daily  Some dizziness when she stands-family thinks this is due to dehydration, disc goals for better hydration

## 2022-09-23 NOTE — Assessment & Plan Note (Signed)
Disc goals for lipids and reasons to control them Rev last labs with pt Rev low sat fat diet in detail  D/c simvastatin today in light of increasing leg pain  Lab today  Given age - concern is quality of life

## 2022-09-23 NOTE — Assessment & Plan Note (Signed)
Continues to progress Stays with family who do a good job  Occupational hygienist to eat and drink- disc strategies to hydrate her and provide ques for meals  Also family manages medication  Little to no agitation so far

## 2022-09-23 NOTE — Progress Notes (Signed)
Subjective:    Patient ID: Amanda Salas, female    DOB: [birth year], 88 y.o.   MRN: [id number]  HPI Pt presents for annual f/u of chronic health problems   Wt Readings from Last 3 Encounters:  09/23/22 121 lb (54.9 kg)  05/22/22 115 lb (52.2 kg)  09/02/21 113 lb (51.3 kg)   23.63 kg/m  Doing ok  Watches TV Plays with her dog   Is swimmy headed at times  When she stands  Does not drink much fluid- family is struggling   Adding some flavor to her water  She feels better after drinking fluids   Likes coffee  8 oz of juice every am  Occ ginger ale  Forgets it     Flu shot -today  HTN bp is stable today  No cp or palpitations or headaches or edema  No side effects to medicines  BP Readings from Last 3 Encounters:  09/23/22 126/68  09/02/21 113/69  07/15/20 (!) 132/76    Pulse Readings from Last 3 Encounters:  09/23/22 71  07/15/20 68  06/12/20 74    Losartan 50 mg daily  Metoprolol xl 50 mg daily   Past CKD Lab Results  Component Value Date   CREATININE 0.92 07/15/2020   BUN 25 (H) 07/15/2020   NA 134 (L) 07/15/2020   K 4.3 07/15/2020   CL 101 07/15/2020   CO2 26 07/15/2020   Lab Results  Component Value Date   WBC 7.2 07/15/2020   HGB 12.7 07/15/2020   HCT 38.7 07/15/2020   MCV 92.0 07/15/2020   PLT 97.0 (L) 07/15/2020     Dementia  She is aware she has it and that she forgets  Not a whole lot of agitation   Family cares for her at home  She does forget to eat  Aricept 10 mg daily  Namenda 5 mg bid   Osteoporosis - Had a femur fx in 2021 and spinal comp fx prior  Past 5 y course of evista Dexa - declines  Falls- last one in feb  Fractures -fx her shoulder with a fall (recovered very quickly)  Supplements ca and D3  D level -last in 21s  Exercise -PT and OT have been out to work with her   Past elevated glucose  No dental problems No teeth    Hyperlipidemia Lab Results  Component Value Date   CHOL 143 08/30/2018    HDL 78.50 08/30/2018   LDLCALC 44 08/30/2018   LDLDIRECT 81.0 07/01/2010   TRIG 104.0 08/30/2018   CHOLHDL 2 08/30/2018   Simvastatin 20 mg daily   Patient Active Problem List   Diagnosis Date Noted   Estrogen deficiency 09/23/2022   H/O fracture of femur 06/09/2020   Stage 3a chronic kidney disease ([place]) 06/09/2020   Elevated glucose 06/09/2020   Medicare annual wellness visit, subsequent 08/30/2018   Routine general medical examination at a health care facility 08/30/2018   Advance care planning 08/30/2018   H/O fracture of hip 10/30/2017   Dystrophic nail 04/23/2015   Encounter for Medicare annual wellness exam 04/17/2014   Colon cancer screening 04/17/2014   Anemia 04/17/2014   History of vertebral compression fracture 12/26/2013   Low back pain 11/22/2013   Mycotic toenails 11/04/2011   Dementia ([place]) 12/29/2010   Vitamin D deficiency 07/01/2010   FALL, HX OF 07/01/2010   Hyperlipidemia 08/10/2008   Essential hypertension 08/10/2008   ALLERGIC RHINITIS 08/10/2008   GERD 08/10/2008   Osteoporosis 08/10/2008  Past Medical History:  Diagnosis Date   Allergy    Arthritis    Cataract    Dementia (HCC)    Disorder of bone and cartilage, unspecified    GERD (gastroesophageal reflux disease)    Hepatitis    Hyperlipidemia    Hypertension    Thrombocytopenia, unspecified (HCC)    Past Surgical History:  Procedure Laterality Date   CARDIAC CATHETERIZATION     ESOPHAGOGASTRODUODENOSCOPY     INTRAMEDULLARY (IM) NAIL INTERTROCHANTERIC Left 06/09/2020   INTRAMEDULLARY (IM) NAIL INTERTROCHANTERIC Left 06/09/2020   Procedure: INTRAMEDULLARY (IM) NAIL INTERTROCHANTRIC;  Surgeon: Leandrew Koyanagi, MD;  Location: [facility];  Service: Orthopedics;  Laterality: Left;   TOTAL HIP ARTHROPLASTY Right 10/31/2017   Procedure: TOTAL HIP ARTHROPLASTY DIRECT ANTERIOR APPROACH;  Surgeon: Leandrew Koyanagi, MD;  Location: [facility];  Service: Orthopedics;  Laterality: Right;   Social History    Tobacco Use   Smoking status: Never   Smokeless tobacco: Never  Vaping Use   Vaping Use: Never used  Substance Use Topics   Alcohol use: No    Alcohol/week: 0.0 standard drinks of alcohol   Drug use: No   Family History  Problem Relation Age of Onset   Alzheimer's disease Mother    Allergies  Allergen Reactions   Ace Inhibitors     REACTION: cough   Alendronate Sodium     REACTION: pill got stuck   Current Outpatient Medications on File Prior to Visit  Medication Sig Dispense Refill   acetaminophen (TYLENOL) 325 MG tablet Take 2 tablets (650 mg total) by mouth every 6 (six) hours as needed for mild pain (pain score 1-3 or temp > 100.5). (Patient taking differently: Take 650 mg by mouth every 6 (six) hours as needed for mild pain. Also takes 2 tablets at bedtime) 30 tablet 0   alum & mag hydroxide-simeth (MAALOX/MYLANTA) 200-200-20 MG/5ML suspension Take 30 mLs by mouth every 4 (four) hours as needed for indigestion. 355 mL 0   Calcium Carbonate-Vitamin D (CALCIUM-D PO) Take 1 tablet by mouth daily.     Cholecalciferol (VITAMIN D3 PO) Take 1,000 Units daily by mouth.      docusate sodium (COLACE) 100 MG capsule Take 1 capsule (100 mg total) by mouth 2 (two) times daily. 10 capsule 0   losartan (COZAAR) 50 MG tablet TAKE 1 TABLET BY MOUTH ONCE A DAY 90 tablet 1   melatonin 3 MG TABS tablet Take 1 tablet (3 mg total) by mouth at bedtime as needed (sleep). 20 tablet 0   metoprolol succinate (TOPROL-XL) 50 MG 24 hr tablet TAKE 1 TABLET BY MOUTH ONCE A DAY WITH OR IMMEDIATELY FOLLOWING A MEAL 30 tablet 0   No current facility-administered medications on file prior to visit.     Review of Systems  Constitutional:  Positive for fatigue. Negative for activity change, appetite change, fever and unexpected weight change.  HENT:  Negative for congestion, ear pain, rhinorrhea, sinus pressure and sore throat.   Eyes:  Negative for pain, redness and visual disturbance.  Respiratory:   Negative for cough, shortness of breath and wheezing.   Cardiovascular:  Negative for chest pain and palpitations.  Gastrointestinal:  Negative for abdominal pain, blood in stool, constipation and diarrhea.  Endocrine: Negative for polydipsia and polyuria.  Genitourinary:  Negative for dysuria, frequency and urgency.  Musculoskeletal:  Positive for arthralgias, back pain and myalgias.  Skin:  Negative for pallor and rash.  Allergic/Immunologic: Negative for environmental allergies.  Neurological:  Negative for dizziness, syncope and headaches.  Hematological:  Negative for adenopathy. Does not bruise/bleed easily.  Psychiatric/Behavioral:  Positive for decreased concentration. Negative for dysphoric mood. The patient is not nervous/anxious.        Objective:   Physical Exam Constitutional:      General: She is not in acute distress.    Appearance: Normal appearance. She is well-developed and normal weight. She is not ill-appearing or diaphoretic.     Comments: Frail appearing   HENT:     Head: Normocephalic and atraumatic.     Mouth/Throat:     Mouth: Mucous membranes are moist.  Eyes:     General: No scleral icterus.    Conjunctiva/sclera: Conjunctivae normal.     Pupils: Pupils are equal, round, and reactive to light.  Neck:     Thyroid: No thyromegaly.     Vascular: No carotid bruit or JVD.  Cardiovascular:     Rate and Rhythm: Normal rate and regular rhythm.     Heart sounds: Normal heart sounds.     No gallop.  Pulmonary:     Effort: Pulmonary effort is normal. No respiratory distress.     Breath sounds: Normal breath sounds. No wheezing or rales.  Abdominal:     General: There is no distension or abdominal bruit.     Palpations: Abdomen is soft.  Musculoskeletal:     Cervical back: Normal range of motion and neck supple.     Right lower leg: No edema.     Left lower leg: No edema.     Comments: Moderate kyphosis Changes of OA in knees/hips and spine   Lymphadenopathy:     Cervical: No cervical adenopathy.  Skin:    General: Skin is warm and dry.     Coloration: Skin is not pale.     Findings: No rash.     Comments: Fair complexion  No pallor  Neurological:     Mental Status: She is alert.     Coordination: Coordination normal.     Deep Tendon Reflexes: Reflexes are normal and symmetric. Reflexes normal.     Comments: Repeats herself  Poor short term memory  Psychiatric:        Mood and Affect: Mood normal.        Cognition and Memory: Cognition is impaired. She exhibits impaired recent memory.     Comments: Calm Baseline dementia            Assessment & Plan:   Problem List Items Addressed This Visit       Cardiovascular and Mediastinum   Essential hypertension - Primary    bp in fair control at this time  BP Readings from Last 1 Encounters:  09/23/22 126/68  No changes needed Most recent labs reviewed  Disc lifstyle change with low sodium diet and exercise  Losartan 50 mg daily  Metoprolol xl 50 mg daily  Some dizziness when she stands-family thinks this is due to dehydration, disc goals for better hydration       Relevant Orders   TSH   Lipid panel   Comprehensive metabolic panel   CBC with Differential/Platelet     Nervous and Auditory   Dementia ([place])    Continues to progress Stays with family who do a good job  Occupational hygienist to eat and drink- disc strategies to hydrate her and provide ques for meals  Also family manages medication  Little to no agitation so far       Relevant  Medications   donepezil (ARICEPT) 10 MG tablet   memantine (NAMENDA) 5 MG tablet     Musculoskeletal and Integument   Osteoporosis    H/o fx spine/ hips and shoulder  Is prone to fall at adv age  Disc fall prev, walker , has had PT  dexa ordered Would be open to trial of alendronate (given info) dep on results  No dental issues No recent GERD Taking ca and D Wt bearing is limited  Handouts given       Relevant  Orders   VITAMIN D 25 Hydroxy (Vit-D Deficiency, Fractures)     Genitourinary   Stage 3a chronic kidney disease (HCC) (Chronic)    Lab today  Discussed strategies for hydration, offering liquids she likes       Relevant Orders   Comprehensive metabolic panel     Other   Elevated glucose    a1c ordered  Diet is not limited due to age       Relevant Orders   Hemoglobin A1c   Estrogen deficiency   Relevant Orders   DG Bone Density   Hyperlipidemia    Disc goals for lipids and reasons to control them Rev last labs with pt Rev low sat fat diet in detail  D/c simvastatin today in light of increasing leg pain  Lab today  Given age - concern is quality of life       Relevant Orders   Lipid panel   Vitamin D deficiency    D level today  Taking 1000 iu D3 daily  Has OP with fractures       Other Visit Diagnoses     Need for influenza vaccination       Relevant Orders   Flu Vaccine QUAD High Dose(Fluad) (Completed)

## 2022-09-24 LAB — LIPID PANEL
Cholesterol: 133 mg/dL (ref 0–200)
HDL: 55.1 mg/dL (ref >39.00)
LDL Cholesterol: 60 mg/dL (ref 0–99)
NonHDL: 77.8
Total CHOL/HDL Ratio: 2
Triglycerides: 91 mg/dL (ref 0.0–149.0)
VLDL: 18.2 mg/dL (ref 0.0–40.0)

## 2022-09-24 LAB — COMPREHENSIVE METABOLIC PANEL
ALT: 16 U/L (ref 0–35)
AST: 25 U/L (ref 0–37)
Albumin: 3.9 g/dL (ref 3.5–5.2)
Alkaline Phosphatase: 72 U/L (ref 39–117)
BUN: 32 mg/dL — ABNORMAL HIGH (ref 6–23)
CO2: 25 mEq/L (ref 19–32)
Calcium: 9.2 mg/dL (ref 8.4–10.5)
Chloride: 101 mEq/L (ref 96–112)
Creatinine, Ser: 1.12 mg/dL (ref 0.40–1.20)
GFR: 42.6 mL/min — ABNORMAL LOW (ref >60.00)
Glucose, Bld: 97 mg/dL (ref 70–99)
Potassium: 4.6 mEq/L (ref 3.5–5.1)
Sodium: 138 mEq/L (ref 135–145)
Total Bilirubin: 0.3 mg/dL (ref 0.2–1.2)
Total Protein: 7.1 g/dL (ref 6.0–8.3)

## 2022-09-24 LAB — CBC WITH DIFFERENTIAL/PLATELET
Basophils Absolute: 0 10*3/uL (ref 0.0–0.1)
Basophils Relative: 0.4 % (ref 0.0–3.0)
Eosinophils Absolute: 0.2 10*3/uL (ref 0.0–0.7)
Eosinophils Relative: 3.2 % (ref 0.0–5.0)
HCT: 36.8 % (ref 36.0–46.0)
Hemoglobin: 12.2 g/dL (ref 12.0–15.0)
Lymphocytes Relative: 31 % (ref 12.0–46.0)
Lymphs Abs: 2.1 10*3/uL (ref 0.7–4.0)
MCHC: 33 g/dL (ref 30.0–36.0)
MCV: 93.2 fl (ref 78.0–100.0)
Monocytes Absolute: 0.8 10*3/uL (ref 0.1–1.0)
Monocytes Relative: 11 % (ref 3.0–12.0)
Neutro Abs: 3.7 10*3/uL (ref 1.4–7.7)
Neutrophils Relative %: 54.4 % (ref 43.0–77.0)
Platelets: 138 10*3/uL — ABNORMAL LOW (ref 150.0–400.0)
RBC: 3.95 Mil/uL (ref 3.87–5.11)
RDW: 14.5 % (ref 11.5–15.5)
WBC: 6.9 10*3/uL (ref 4.0–10.5)

## 2022-09-24 LAB — VITAMIN D 25 HYDROXY (VIT D DEFICIENCY, FRACTURES): VITD: 74.65 ng/mL (ref 30.00–100.00)

## 2022-09-24 LAB — HEMOGLOBIN A1C: Hgb A1c MFr Bld: 5.7 % (ref 4.6–6.5)

## 2022-09-24 LAB — TSH: TSH: 4.16 u[IU]/mL (ref 0.35–5.50)

## 2022-10-09 ENCOUNTER — Other Ambulatory Visit: Payer: Self-pay | Admitting: Family Medicine

## 2022-11-09 ENCOUNTER — Other Ambulatory Visit: Payer: Self-pay | Admitting: Family Medicine

## 2022-11-13 ENCOUNTER — Other Ambulatory Visit: Payer: Self-pay | Admitting: Family Medicine

## 2023-02-03 ENCOUNTER — Ambulatory Visit (INDEPENDENT_AMBULATORY_CARE_PROVIDER_SITE_OTHER): Payer: 59 | Admitting: Podiatry

## 2023-02-03 ENCOUNTER — Encounter: Payer: Self-pay | Admitting: Podiatry

## 2023-02-03 DIAGNOSIS — M79674 Pain in right toe(s): Secondary | ICD-10-CM

## 2023-02-03 DIAGNOSIS — M79675 Pain in left toe(s): Secondary | ICD-10-CM | POA: Diagnosis not present

## 2023-02-03 DIAGNOSIS — L84 Corns and callosities: Secondary | ICD-10-CM

## 2023-02-03 DIAGNOSIS — B351 Tinea unguium: Secondary | ICD-10-CM

## 2023-02-04 ENCOUNTER — Ambulatory Visit: Payer: 59 | Admitting: Podiatry

## 2023-02-06 NOTE — Progress Notes (Signed)
  Subjective:  Patient ID: Amanda Salas, female    DOB: 1927-06-18,  MRN: [id number]  Chief Complaint  Patient presents with   Nail Problem    "I want her nails trimmed.  She has a callus on the left heel." N - nail and callus L - hallux rt, callus - heel left D - 4-5 years, callus - 4 weeks O - gradually worse C - toenails - none / callus tender A - shoe hit callus T - daughter cuts toenails, callus - otc patch    89 y.o. female presents with the above complaint. History confirmed with patient.   Objective:  Physical Exam: warm, good capillary refill, no trophic changes or ulcerative lesions, normal DP and PT pulses, and normal sensory exam. Left Foot: dystrophic yellowed discolored nail plates with subungual debris, painful callus left plantar heel Right Foot: dystrophic yellowed discolored nail plates with subungual debris   Assessment:   1. Pain due to onychomycosis of toenails of both feet      Plan:  Patient was evaluated and treated and all questions answered.   Discussed the etiology and treatment options for the condition in detail with the patient. Educated patient on the topical and oral treatment options for mycotic nails. Recommended debridement of the nails today. Sharp and mechanical debridement performed of all painful and mycotic nails today. Nails debrided in length and thickness using a nail nipper to level of comfort. Discussed treatment options including appropriate shoe gear. Follow up as needed for painful nails.  Discussed etiology treatment options with painful hyperkeratotic lesion discussed offloading and use of cream such as salicylic acid and urea ointment.  Lesion was debrided as a courtesy.  Return in about 3 months (around 05/04/2023) for painful thick fungal nails.

## 2023-03-16 ENCOUNTER — Other Ambulatory Visit: Payer: Medicare Other

## 2023-04-06 ENCOUNTER — Other Ambulatory Visit: Payer: Self-pay | Admitting: Family Medicine

## 2023-04-27 ENCOUNTER — Ambulatory Visit
Admission: RE | Admit: 2023-04-27 | Discharge: 2023-04-27 | Disposition: A | Discharge status: Home / Self Care | Payer: 59 | Source: Ambulatory Visit | Attending: Family Medicine | Admitting: Family Medicine

## 2023-04-27 ENCOUNTER — Other Ambulatory Visit: Payer: Self-pay | Admitting: Family Medicine

## 2023-04-27 DIAGNOSIS — E559 Vitamin D deficiency, unspecified: Secondary | ICD-10-CM

## 2023-04-27 DIAGNOSIS — M810 Age-related osteoporosis without current pathological fracture: Secondary | ICD-10-CM | POA: Diagnosis not present

## 2023-04-27 DIAGNOSIS — N1831 Chronic kidney disease, stage 3a: Secondary | ICD-10-CM

## 2023-04-27 DIAGNOSIS — R7309 Other abnormal glucose: Secondary | ICD-10-CM

## 2023-04-27 DIAGNOSIS — E2839 Other primary ovarian failure: Secondary | ICD-10-CM

## 2023-04-27 DIAGNOSIS — E7800 Pure hypercholesterolemia, unspecified: Secondary | ICD-10-CM

## 2023-04-27 DIAGNOSIS — I10 Essential (primary) hypertension: Secondary | ICD-10-CM

## 2023-04-27 DIAGNOSIS — F03B4 Unspecified dementia, moderate, with anxiety: Secondary | ICD-10-CM

## 2023-04-27 DIAGNOSIS — M8000XS Age-related osteoporosis with current pathological fracture, unspecified site, sequela: Secondary | ICD-10-CM

## 2023-04-27 DIAGNOSIS — Z23 Encounter for immunization: Secondary | ICD-10-CM

## 2023-04-28 ENCOUNTER — Telehealth: Payer: Self-pay | Admitting: Family Medicine

## 2023-04-28 DIAGNOSIS — M8000XS Age-related osteoporosis with current pathological fracture, unspecified site, sequela: Secondary | ICD-10-CM

## 2023-04-28 MED ORDER — ALENDRONATE SODIUM 70 MG PO TABS
70.0000 mg | ORAL_TABLET | ORAL | 11 refills | Status: DC
Start: 2023-04-28 — End: 2023-10-18

## 2023-04-28 NOTE — Telephone Encounter (Signed)
I sent it  Important to take with full glass of water once weekly first thing in am at least 30 minutes before more food or drink and do not lie back down   If she develops any side effects like indigestion/ esophageal discomfort or aches/pains stop it and call   Goal is to take for 5 years if she tolerates it well

## 2023-04-28 NOTE — Assessment & Plan Note (Signed)
Worse this check Plan to try alendronate again being sure to take appropriately  Instructed to call if any side effects or esophageal problems

## 2023-04-28 NOTE — Telephone Encounter (Signed)
-----   Message from Donnamarie Poag, [place] sent at 04/28/2023  2:39 PM EDT ----- Called patient daughter  ( on dpr)reviewed all information and repeated back to me. Will call if any questions.  She is ok with starting would like sent to [facility] pharmacy. She does not have any upcoming dental work.

## 2023-04-29 NOTE — Telephone Encounter (Signed)
Spoke with pt's daughter, Jacki Cones (on dpr), relaying Dr. Royden Purl message. She verbalizes understanding and expresses her thanks.

## 2023-04-29 NOTE — Telephone Encounter (Signed)
Pt daughter returned call . Would like a call back #[phone]

## 2023-04-29 NOTE — Telephone Encounter (Signed)
Lvm asking pt/pt's daughter, Jacki Cones (on dpr), to call back. Need to relay Dr. Royden Purl message.

## 2023-05-03 ENCOUNTER — Other Ambulatory Visit: Payer: Self-pay | Admitting: Family Medicine

## 2023-05-07 ENCOUNTER — Ambulatory Visit: Payer: 59 | Admitting: Podiatry

## 2023-05-18 ENCOUNTER — Telehealth: Payer: Self-pay

## 2023-05-18 MED ORDER — LOSARTAN POTASSIUM 50 MG PO TABS: 50.0000 mg | ORAL_TABLET | Freq: Every day | ORAL | 1 refills | Status: DC

## 2023-05-18 MED ORDER — METOPROLOL SUCCINATE ER 50 MG PO TB24: ORAL_TABLET | ORAL | 1 refills | Status: DC

## 2023-05-18 NOTE — Telephone Encounter (Signed)
Received call from daughter pharmacy told them refill request had been denied. Would like to get refill sent in for losartan and Metoprolol.

## 2023-05-18 NOTE — Telephone Encounter (Signed)
It was to soon to filled but will refill it now.

## 2023-05-18 NOTE — Addendum Note (Signed)
Addended by: Shon Millet on: 05/18/2023 02:15 PM   Modules accepted: Orders

## 2023-08-06 NOTE — Telephone Encounter (Signed)
Error

## 2023-09-01 ENCOUNTER — Encounter: Payer: Self-pay | Admitting: Family Medicine

## 2023-09-01 DIAGNOSIS — Z8781 Personal history of (healed) traumatic fracture: Secondary | ICD-10-CM

## 2023-09-01 DIAGNOSIS — Z7409 Other reduced mobility: Secondary | ICD-10-CM | POA: Insufficient documentation

## 2023-09-01 NOTE — Telephone Encounter (Signed)
Please print the order, I will sign it to fax Thanks

## 2023-09-03 NOTE — Telephone Encounter (Signed)
What diagnosis do you want associated with the shower chair?

## 2023-09-03 NOTE — Telephone Encounter (Signed)
Please disregard, did not realize this had already been taking care of.  Faxing completed DME order to [employer]

## 2023-09-03 NOTE — Telephone Encounter (Signed)
Called and spoke with patients daughter, she stated the lady that she spoke with for [employer]  was not local rather in a different state. Advised we may have had the wrong fax number. Found the local fax number for [employer] and refaxed order. Notified patients duaghter.

## 2023-09-03 NOTE — Telephone Encounter (Signed)
Pt's daughter called stating the company the shower chair order was sent to, reached out to the pt & said she wasn't in their "territory" so they cannot help pt. Call back # [phone]

## 2023-09-09 NOTE — Telephone Encounter (Signed)
Well,she said that they will just have to do without the chair,before she hangs up on me.Randie Heinz!

## 2023-09-09 NOTE — Telephone Encounter (Signed)
Noted.  Thanks for trying.

## 2023-09-09 NOTE — Telephone Encounter (Signed)
I contacted patient ,and spoke with daughter.She said that the order was for a replacement one for tan old one that she had.She said that they have a lot going on besides her moms health and she don't know if they could actually come in for an face to face visit. She wanted to know if a phone visit would work.

## 2023-09-09 NOTE — Telephone Encounter (Signed)
For [place] to file this and insurance try to pay an in person face to face has to be done and sent to insurance company it can't be virtual

## 2023-09-09 NOTE — Telephone Encounter (Signed)
Received a fax back saying I have to complete the request for DME supplies online at their website [facility]). While completing the form it asked me to upload a face-to-face appt with in the last 3 months. Pt hasn't been seen since Oct 2023 and no future appts., in order to complete forms pt has to have a face to face (in office) appt so PCP can eval mobility.   Please call pt/family and schedule an appt with PCP in order for [place] to proceed with ordering a shower chair

## 2023-10-07 ENCOUNTER — Other Ambulatory Visit: Payer: Self-pay | Admitting: Family Medicine

## 2023-10-07 NOTE — Telephone Encounter (Signed)
Pt hasn't been seen in over a year and no future appts., please advise

## 2023-10-07 NOTE — Telephone Encounter (Signed)
Refilled for 2 months Please schedule a follow up appointment when convenient  Contact family as she has dementia  Thanks

## 2023-10-08 NOTE — Telephone Encounter (Signed)
Patient has been scheduled

## 2023-10-12 ENCOUNTER — Ambulatory Visit: Payer: 59 | Admitting: Family Medicine

## 2023-10-12 ENCOUNTER — Encounter: Payer: Self-pay | Admitting: Family Medicine

## 2023-10-12 VITALS — BP 108/62 | HR 60 | Temp 97.7°F | Ht 60.0 in | Wt 116.0 lb

## 2023-10-12 DIAGNOSIS — I10 Essential (primary) hypertension: Secondary | ICD-10-CM | POA: Diagnosis not present

## 2023-10-12 DIAGNOSIS — G8929 Other chronic pain: Secondary | ICD-10-CM | POA: Diagnosis not present

## 2023-10-12 DIAGNOSIS — M5441 Lumbago with sciatica, right side: Secondary | ICD-10-CM

## 2023-10-12 DIAGNOSIS — N1831 Chronic kidney disease, stage 3a: Secondary | ICD-10-CM | POA: Diagnosis not present

## 2023-10-12 DIAGNOSIS — M5442 Lumbago with sciatica, left side: Secondary | ICD-10-CM

## 2023-10-12 DIAGNOSIS — Z7409 Other reduced mobility: Secondary | ICD-10-CM | POA: Diagnosis not present

## 2023-10-12 DIAGNOSIS — R7309 Other abnormal glucose: Secondary | ICD-10-CM

## 2023-10-12 DIAGNOSIS — E559 Vitamin D deficiency, unspecified: Secondary | ICD-10-CM

## 2023-10-12 DIAGNOSIS — E7800 Pure hypercholesterolemia, unspecified: Secondary | ICD-10-CM

## 2023-10-12 DIAGNOSIS — F03B4 Unspecified dementia, moderate, with anxiety: Secondary | ICD-10-CM | POA: Diagnosis not present

## 2023-10-12 DIAGNOSIS — M8000XS Age-related osteoporosis with current pathological fracture, unspecified site, sequela: Secondary | ICD-10-CM

## 2023-10-12 DIAGNOSIS — Z23 Encounter for immunization: Secondary | ICD-10-CM | POA: Diagnosis not present

## 2023-10-12 NOTE — Assessment & Plan Note (Signed)
In pt of advanced age  Family cares for her at home  Some progression  Still grooming  Appetite is fair  Plan to continue namenda 5 mg bid and aricept 10 mg daily

## 2023-10-12 NOTE — Assessment & Plan Note (Signed)
Lab Results  Component Value Date   HGBA1C 5.7 09/23/2022   At this point eats what she wants (at 60 y old)

## 2023-10-12 NOTE — Assessment & Plan Note (Signed)
bp in fair control at this time  BP Readings from Last 1 Encounters:  10/12/23 108/62   No changes needed Most recent labs reviewed  Disc lifstyle change with low sodium diet and exercise  Losartan 50 mg daily  Metoprolol xl 50 mg daily

## 2023-10-12 NOTE — Patient Instructions (Addendum)
Labs today  I will send in your medicine   Please keep eating regularly and drinking fluids   Let us know if you need any thing   Flu shot today

## 2023-10-12 NOTE — Assessment & Plan Note (Signed)
Chronic Tylenol helps Uses walker No new comp fracture

## 2023-10-12 NOTE — Assessment & Plan Note (Signed)
With OSTEOPOROSIS and fractures Takes 1000 international units daily   Level ordered

## 2023-10-12 NOTE — Progress Notes (Signed)
Subjective:    Patient ID: Amanda Salas, female    DOB: 07/12/1927, 89 y.o.   MRN: [id number]  HPI  Wt Readings from Last 3 Encounters:  10/12/23 116 lb (52.6 kg)  09/23/22 121 lb (54.9 kg)  05/22/22 115 lb (52.2 kg)   22.65 kg/m  Vitals:   10/12/23 1431  BP: 108/62  Pulse: 60  Temp: 97.7 F (36.5 C)  SpO2: 98%    Pt presents for follow up of chronic health problems and flu shot   Notes vision is not as good  Mixing up her glasses  Has had cataract surgery years ago  Due for eye appointment   Eats small meals and snack  Family helps- family gives 3 meals per day and one ensure  Weight is down a bit  Still likes to eat      HTN bp is stable today  No cp or palpitations or headaches or edema  No side effects to medicines  BP Readings from Last 3 Encounters:  10/12/23 108/62  09/23/22 126/68  09/02/21 113/69     Losartan 50 mg daily  Metoprolol xl 50 mg daily   Pulse Readings from Last 3 Encounters:  10/12/23 60  09/23/22 71  07/15/20 68   Denies dizziness unless she wears wrong glasses     CKD Lab Results  Component Value Date   NA 138 09/23/2022   K 4.6 09/23/2022   CO2 25 09/23/2022   GLUCOSE 97 09/23/2022   BUN 32 (H) 09/23/2022   CREATININE 1.12 09/23/2022   CALCIUM 9.2 09/23/2022   GFR 42.60 (L) 09/23/2022   GFRNONAA 59 (L) 06/12/2020    Dementia - is getting worse /more forgetful  Family cares for her at home Takes namenda 5 mg bid  Aricept 10 mg daily   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    Osteoporosis  Dexa 04/2023  Started weekly alendronate in May No side effects or problems  Falls none new  Fractures: none  Has history of vert comp fracture in past and femur fracture  Supplements Last vitamin D Lab Results  Component Value Date   VD25OH 74.65 09/23/2022    Exercise is limited /pain   Is mobility impaired from arthritis Voiced need for shower chair but was unable to come in for mobility exam  Takes  tylenol prn    Hyperlipidemia Lab Results  Component Value Date   CHOL 133 09/23/2022   HDL 55.10 09/23/2022   LDLCALC 60 09/23/2022   LDLDIRECT 81.0 07/01/2010   TRIG 91.0 09/23/2022   CHOLHDL 2 09/23/2022   Stopped simvastatin in light of muscle pain and advanced age  Posey Rea if it made a difference   Patient Active Problem List   Diagnosis Date Noted   Mobility impaired 09/01/2023   Estrogen deficiency 09/23/2022   H/O fracture of femur 06/09/2020   Stage 3a chronic kidney disease (HCC) 06/09/2020   Elevated glucose 06/09/2020   Medicare annual wellness visit, subsequent 08/30/2018   Routine general medical examination at a health care facility 08/30/2018   Advance care planning 08/30/2018   H/O fracture of hip 10/30/2017   Dystrophic nail 04/23/2015   History of vertebral compression fracture 12/26/2013   Low back pain 11/22/2013   Mycotic toenails 11/04/2011   Dementia (HCC) 12/29/2010   Vitamin D deficiency 07/01/2010   FALL, HX OF 07/01/2010   Hyperlipidemia 08/10/2008   Essential hypertension 08/10/2008   ALLERGIC RHINITIS 08/10/2008   GERD 08/10/2008  Osteoporosis 08/10/2008   Past Medical History:  Diagnosis Date   Allergy    Arthritis    Cataract    Dementia (HCC)    Disorder of bone and cartilage, unspecified    GERD (gastroesophageal reflux disease)    Hepatitis    Hyperlipidemia    Hypertension    Thrombocytopenia, unspecified (HCC)    Past Surgical History:  Procedure Laterality Date   CARDIAC CATHETERIZATION     ESOPHAGOGASTRODUODENOSCOPY     INTRAMEDULLARY (IM) NAIL INTERTROCHANTERIC Left 06/09/2020   INTRAMEDULLARY (IM) NAIL INTERTROCHANTERIC Left 06/09/2020   Procedure: INTRAMEDULLARY (IM) NAIL INTERTROCHANTRIC;  Surgeon: Tarry Kos, MD;  Location: [facility] OR;  Service: Orthopedics;  Laterality: Left;   TOTAL HIP ARTHROPLASTY Right 10/31/2017   Procedure: TOTAL HIP ARTHROPLASTY DIRECT ANTERIOR APPROACH;  Surgeon: Tarry Kos, MD;   Location: [facility] OR;  Service: Orthopedics;  Laterality: Right;   Social History   Tobacco Use   Smoking status: Never   Smokeless tobacco: Never  Vaping Use   Vaping status: Never Used  Substance Use Topics   Alcohol use: No    Alcohol/week: 0.0 standard drinks of alcohol   Drug use: No   Family History  Problem Relation Age of Onset   Alzheimer's disease Mother    Allergies  Allergen Reactions   Ace Inhibitors     REACTION: cough   Current Outpatient Medications on File Prior to Visit  Medication Sig Dispense Refill   acetaminophen (TYLENOL) 325 MG tablet Take 2 tablets (650 mg total) by mouth every 6 (six) hours as needed for mild pain (pain score 1-3 or temp > 100.5). (Patient taking differently: Take 650 mg by mouth every 6 (six) hours as needed for mild pain. Also takes 2 tablets at bedtime) 30 tablet 0   alendronate (FOSAMAX) 70 MG tablet Take 1 tablet (70 mg total) by mouth every 7 (seven) days. Take with a full glass of water on an empty stomach. 4 tablet 11   alum & mag hydroxide-simeth (MAALOX/MYLANTA) 200-200-20 MG/5ML suspension Take 30 mLs by mouth every 4 (four) hours as needed for indigestion. 355 mL 0   Calcium Carbonate-Vitamin D (CALCIUM-D PO) Take 1 tablet by mouth daily.     Cholecalciferol (VITAMIN D3 PO) Take 1,000 Units daily by mouth.      docusate sodium (COLACE) 100 MG capsule Take 1 capsule (100 mg total) by mouth 2 (two) times daily. 10 capsule 0   donepezil (ARICEPT) 10 MG tablet Take 1 tablet (10 mg total) by mouth at bedtime. 30 tablet 11   losartan (COZAAR) 50 MG tablet Take 1 tablet (50 mg total) by mouth daily. 90 tablet 1   melatonin 3 MG TABS tablet Take 1 tablet (3 mg total) by mouth at bedtime as needed (sleep). 20 tablet 0   memantine (NAMENDA) 5 MG tablet TAKE ONE TABLET BY MOUTH TWICE A DAY 60 tablet 1   metoprolol succinate (TOPROL-XL) 50 MG 24 hr tablet Take with or immediately following a meal. 90 tablet 1   No current  facility-administered medications on file prior to visit.    Review of Systems  Constitutional:  Negative for activity change, appetite change, fatigue, fever and unexpected weight change.  HENT:  Negative for congestion, ear pain, rhinorrhea, sinus pressure and sore throat.   Eyes:  Negative for pain, redness and visual disturbance.  Respiratory:  Negative for cough, shortness of breath and wheezing.   Cardiovascular:  Negative for chest pain and palpitations.  Gastrointestinal:  Negative for abdominal pain, blood in stool, constipation and diarrhea.  Endocrine: Negative for polydipsia and polyuria.  Genitourinary:  Negative for dysuria, frequency and urgency.  Musculoskeletal:  Positive for arthralgias and back pain. Negative for myalgias.  Skin:  Negative for pallor and rash.  Allergic/Immunologic: Negative for environmental allergies.  Neurological:  Negative for dizziness, syncope and headaches.  Hematological:  Negative for adenopathy. Does not bruise/bleed easily.  Psychiatric/Behavioral:  Positive for confusion and decreased concentration. Negative for agitation, behavioral problems, dysphoric mood and hallucinations. The patient is not nervous/anxious.        Dementia is slowly progressing       Objective:   Physical Exam Constitutional:      General: She is not in acute distress.    Appearance: Normal appearance. She is well-developed and normal weight. She is not ill-appearing or diaphoretic.  HENT:     Head: Normocephalic and atraumatic.     Right Ear: Tympanic membrane, ear canal and external ear normal.     Left Ear: Tympanic membrane, ear canal and external ear normal.     Nose: Nose normal. No congestion.     Mouth/Throat:     Mouth: Mucous membranes are moist.     Pharynx: Oropharynx is clear. No posterior oropharyngeal erythema.  Eyes:     General: No scleral icterus.    Extraocular Movements: Extraocular movements intact.     Conjunctiva/sclera: Conjunctivae  normal.     Pupils: Pupils are equal, round, and reactive to light.  Neck:     Thyroid: No thyromegaly.     Vascular: No carotid bruit or JVD.  Cardiovascular:     Rate and Rhythm: Normal rate and regular rhythm.     Pulses: Normal pulses.     Heart sounds: Normal heart sounds.     No gallop.  Pulmonary:     Effort: Pulmonary effort is normal. No respiratory distress.     Breath sounds: Normal breath sounds. No wheezing.     Comments: Good air exch Chest:     Chest wall: No tenderness.  Abdominal:     General: Bowel sounds are normal. There is no distension or abdominal bruit.     Palpations: Abdomen is soft. There is no mass.     Tenderness: There is no abdominal tenderness.     Hernia: No hernia is present.  Musculoskeletal:        General: No tenderness. Normal range of motion.     Cervical back: Normal range of motion and neck supple. No rigidity. No muscular tenderness.     Right lower leg: No edema.     Left lower leg: No edema.     Comments: No kyphosis   Lymphadenopathy:     Cervical: No cervical adenopathy.  Skin:    General: Skin is warm and dry.     Coloration: Skin is not pale.     Findings: No erythema or rash.     Comments: Fair complexion  Neurological:     Mental Status: She is alert. Mental status is at baseline.     Cranial Nerves: No cranial nerve deficit.     Motor: No abnormal muscle tone.     Coordination: Coordination normal.     Gait: Gait normal.     Deep Tendon Reflexes: Reflexes are normal and symmetric. Reflexes normal.  Psychiatric:        Attention and Perception: Attention normal.        Mood and Affect: Mood normal.  Speech: Speech normal.        Behavior: Behavior normal.        Cognition and Memory: Cognition is impaired. She exhibits impaired recent memory.     Comments: Pleasant Talkative Good sense of humor   Dementia - repeats self            Assessment & Plan:   Problem List Items Addressed This Visit        Cardiovascular and Mediastinum   Essential hypertension - Primary    bp in fair control at this time  BP Readings from Last 1 Encounters:  10/12/23 108/62   No changes needed Most recent labs reviewed  Disc lifstyle change with low sodium diet and exercise  Losartan 50 mg daily  Metoprolol xl 50 mg daily        Relevant Orders   TSH   Lipid panel   Comprehensive metabolic panel   CBC with Differential/Platelet     Nervous and Auditory   Dementia (HCC)    In pt of advanced age  Family cares for her at home  Some progression  Still grooming  Appetite is fair  Plan to continue namenda 5 mg bid and aricept 10 mg daily          Musculoskeletal and Integument   Osteoporosis    Dexa 04/2023 Started on alendronate shortly after that, is tolerating  History of fractures and advanced age Discussed fall prevention, supplements and exercise for bone density   Vit D level drawn        Relevant Orders   VITAMIN D 25 Hydroxy (Vit-D Deficiency, Fractures)     Genitourinary   Stage 3a chronic kidney disease (HCC) (Chronic)    Labs today Encouraged good fluid intake         Other   Elevated glucose    Lab Results  Component Value Date   HGBA1C 5.7 09/23/2022   At this point eats what she wants (at 39 y old)        Hyperlipidemia    Labs ordered Disc goals for lipids and reasons to control them Rev last labs with pt Rev low sat fat diet in detail  Stopped simvastatin a year ago due to advanced age and muscle pain        Relevant Orders   Lipid panel   Low back pain    Chronic Tylenol helps Uses walker No new comp fracture       Mobility impaired    Uses walker Also shower chair   Does well with assist  Back pain worsens this       Vitamin D deficiency    With OSTEOPOROSIS and fractures Takes 1000 international units daily   Level ordered       Relevant Orders   VITAMIN D 25 Hydroxy (Vit-D Deficiency, Fractures)   Other Visit Diagnoses      Need for influenza vaccination       Relevant Orders   Flu Vaccine Trivalent High Dose (Fluad) (Completed)

## 2023-10-12 NOTE — Assessment & Plan Note (Signed)
Uses walker Also shower chair   Does well with assist  Back pain worsens this

## 2023-10-12 NOTE — Assessment & Plan Note (Signed)
Labs ordered Disc goals for lipids and reasons to control them Rev last labs with pt Rev low sat fat diet in detail  Stopped simvastatin a year ago due to advanced age and muscle pain

## 2023-10-12 NOTE — Assessment & Plan Note (Signed)
Dexa 04/2023 Started on alendronate shortly after that, is tolerating  History of fractures and advanced age Discussed fall prevention, supplements and exercise for bone density   Vit D level drawn

## 2023-10-12 NOTE — Assessment & Plan Note (Signed)
Labs today Encouraged good fluid intake

## 2023-10-13 LAB — LIPID PANEL
Cholesterol: 235 mg/dL — ABNORMAL HIGH (ref 0–200)
HDL: 67.2 mg/dL (ref >39.00)
LDL Cholesterol: 144 mg/dL — ABNORMAL HIGH (ref 0–99)
NonHDL: 167.93
Total CHOL/HDL Ratio: 3
Triglycerides: 122 mg/dL (ref 0.0–149.0)
VLDL: 24.4 mg/dL (ref 0.0–40.0)

## 2023-10-13 LAB — CBC WITH DIFFERENTIAL/PLATELET
Basophils Absolute: 0 10*3/uL (ref 0.0–0.1)
Basophils Relative: 0.4 % (ref 0.0–3.0)
Eosinophils Absolute: 0.3 10*3/uL (ref 0.0–0.7)
Eosinophils Relative: 3.9 % (ref 0.0–5.0)
HCT: 37.4 % (ref 36.0–46.0)
Hemoglobin: 12 g/dL (ref 12.0–15.0)
Lymphocytes Relative: 29.9 % (ref 12.0–46.0)
Lymphs Abs: 2.2 10*3/uL (ref 0.7–4.0)
MCHC: 32.2 g/dL (ref 30.0–36.0)
MCV: 94.9 fL (ref 78.0–100.0)
Monocytes Absolute: 0.7 10*3/uL (ref 0.1–1.0)
Monocytes Relative: 9 % (ref 3.0–12.0)
Neutro Abs: 4.2 10*3/uL (ref 1.4–7.7)
Neutrophils Relative %: 56.8 % (ref 43.0–77.0)
Platelets: 168 10*3/uL (ref 150.0–400.0)
RBC: 3.94 Mil/uL (ref 3.87–5.11)
RDW: 16.3 % — ABNORMAL HIGH (ref 11.5–15.5)
WBC: 7.5 10*3/uL (ref 4.0–10.5)

## 2023-10-13 LAB — VITAMIN D 25 HYDROXY (VIT D DEFICIENCY, FRACTURES): VITD: 72.25 ng/mL (ref 30.00–100.00)

## 2023-10-13 LAB — COMPREHENSIVE METABOLIC PANEL
ALT: 13 U/L (ref 0–35)
AST: 24 U/L (ref 0–37)
Albumin: 4.1 g/dL (ref 3.5–5.2)
Alkaline Phosphatase: 57 U/L (ref 39–117)
BUN: 31 mg/dL — ABNORMAL HIGH (ref 6–23)
CO2: 28 meq/L (ref 19–32)
Calcium: 9.6 mg/dL (ref 8.4–10.5)
Chloride: 100 meq/L (ref 96–112)
Creatinine, Ser: 1.17 mg/dL (ref 0.40–1.20)
GFR: 40.12 mL/min — ABNORMAL LOW (ref >60.00)
Glucose, Bld: 94 mg/dL (ref 70–99)
Potassium: 4.2 meq/L (ref 3.5–5.1)
Sodium: 136 meq/L (ref 135–145)
Total Bilirubin: 0.5 mg/dL (ref 0.2–1.2)
Total Protein: 7.4 g/dL (ref 6.0–8.3)

## 2023-10-13 LAB — TSH: TSH: 2.61 u[IU]/mL (ref 0.35–5.50)

## 2023-10-14 ENCOUNTER — Other Ambulatory Visit: Payer: Self-pay | Admitting: Family Medicine

## 2023-10-15 ENCOUNTER — Telehealth: Payer: Self-pay | Admitting: Family Medicine

## 2023-10-15 NOTE — Telephone Encounter (Signed)
Prescription Request  10/15/2023  LOV: 10/12/2023  What is the name of the medication or equipment? donepezil (ARICEPT) 10 MG tablet    Have you contacted your pharmacy to request a refill? Yes   Which pharmacy would you like this sent to?  [facility] - [place], [place] - [address] [place] [address] Phone: [phone] Fax: [phone]  Patient notified that their request is being sent to the clinical staff for review and that they should receive a response within 2 business days.   Please advise at [facility] [phone]

## 2023-10-15 NOTE — Telephone Encounter (Signed)
Med was refilled today already

## 2023-10-18 ENCOUNTER — Telehealth: Payer: Self-pay | Admitting: Family Medicine

## 2023-10-18 DIAGNOSIS — M8000XS Age-related osteoporosis with current pathological fracture, unspecified site, sequela: Secondary | ICD-10-CM

## 2023-10-18 NOTE — Telephone Encounter (Signed)
Please let pt /family know that looking at her most recent kidney labs -I did run this by pharmacist and we do have to stop the alendronate (it is just too hard on kidney) Unfortunate Please go ahead and stop it    Prolia is my 2nd choice and now that she cannot take alendronate I wonder if it would be worth seeing if this would bet covered better than it was in the past   It is a twice yearly injection   Would she /family be open to [place] checking into that as an option (price wise)?

## 2023-10-18 NOTE — Telephone Encounter (Signed)
Spoke with daughter Rosine Abe. Advised her of Dr. Royden Purl comments. Fosamax removed from med list.   Daughter does want to proceed with Prolia. Will route message to Joellen also to run benefits, family said they will decide if pt will start on med depending on the price.   FYI to PCP also

## 2023-10-19 MED ORDER — DENOSUMAB 60 MG/ML ~~LOC~~ SOSY
60.0000 mg | PREFILLED_SYRINGE | Freq: Once | SUBCUTANEOUS | Status: DC
Start: 2023-11-02 — End: 2024-01-19

## 2023-10-19 NOTE — Telephone Encounter (Signed)
Order started and sent for auth. Will reach out to patient after results received.

## 2023-10-25 ENCOUNTER — Telehealth: Payer: Self-pay

## 2023-10-25 NOTE — Telephone Encounter (Signed)
Prolia VOB initiated via [email or web address] 

## 2023-10-28 ENCOUNTER — Encounter: Payer: Self-pay | Admitting: Family Medicine

## 2023-10-28 ENCOUNTER — Other Ambulatory Visit: Payer: Self-pay | Admitting: Family Medicine

## 2023-10-29 ENCOUNTER — Other Ambulatory Visit (HOSPITAL_COMMUNITY): Payer: Self-pay

## 2023-10-29 NOTE — Telephone Encounter (Signed)
Prior Authorization form/request asks a question that requires your assistance. Please see the question below and advise accordingly.       Ran test claim for PROLIA. Currently a quantity of is a 180 day supply and the co-pay is $0.00 .   This test claim was processed through [facility]- copay amounts may vary at other pharmacies due to pharmacy/plan contracts, or as the patient moves through the different stages of their insurance plan.

## 2023-11-01 ENCOUNTER — Other Ambulatory Visit: Payer: Self-pay

## 2023-11-01 ENCOUNTER — Other Ambulatory Visit (HOSPITAL_COMMUNITY): Payer: Self-pay

## 2023-11-01 ENCOUNTER — Encounter (HOSPITAL_COMMUNITY): Payer: Self-pay

## 2023-11-01 MED ORDER — DENOSUMAB 60 MG/ML ~~LOC~~ SOSY
60.0000 mg | PREFILLED_SYRINGE | Freq: Once | SUBCUTANEOUS | 0 refills | Status: AC
  Filled 2023-11-01: qty 1, 1d supply, fill #0

## 2023-11-01 NOTE — Telephone Encounter (Signed)
No - that crcl is too low so she will be unable to take it  This is unfortunate Continue vitamin D and calcium for bone health   We can refer her to endocrinology to discuss any other options for bone density treatment but at her age she may opt not to (also has dementia) Please let family know  Thanks

## 2023-11-01 NOTE — Telephone Encounter (Signed)
Patient  labs done 10/22 her creatinine clearance is   24.95 mL/min and calcium is 9.6. ok to do Prolia?

## 2023-11-02 ENCOUNTER — Other Ambulatory Visit: Payer: Self-pay

## 2023-11-02 NOTE — Telephone Encounter (Signed)
Last vitamin D Lab Results  Component Value Date   VD25OH 72.25 10/12/2023   Last level was good so stay on dose she is taking Thanks

## 2023-11-02 NOTE — Telephone Encounter (Signed)
Called patient daughter on dpr.  She is taking D3 1000mg  daily is that what you want her to stay at or did you want her to go up any?

## 2023-11-03 ENCOUNTER — Other Ambulatory Visit (HOSPITAL_COMMUNITY): Payer: Self-pay

## 2023-11-03 NOTE — Telephone Encounter (Signed)
Left message to return call to our office.  

## 2023-11-05 ENCOUNTER — Telehealth: Payer: Self-pay | Admitting: Family Medicine

## 2023-11-05 ENCOUNTER — Other Ambulatory Visit (HOSPITAL_COMMUNITY): Payer: Self-pay

## 2023-11-05 NOTE — Telephone Encounter (Signed)
Called and spoke to daughter. We had sent the order to pharmacy before we received labs. If they call back she will let them know we have decided to d/c treatment at this time.

## 2023-11-05 NOTE — Telephone Encounter (Signed)
Called patient reviewed all information and repeated back to me. Will call if any questions.  ? ?

## 2023-11-05 NOTE — Telephone Encounter (Signed)
Patient returned call regarding her mom getting set up for prolia,she would like a return call.

## 2023-11-08 ENCOUNTER — Other Ambulatory Visit (HOSPITAL_COMMUNITY): Payer: Self-pay

## 2023-11-08 NOTE — Telephone Encounter (Signed)
Please ask how many she gets daily and ok that verbal order  If you need it written let me know  Thanks

## 2023-11-08 NOTE — Progress Notes (Signed)
Spoke with daughter. Office canceled prescription. Pt will not be getting Prolia. Disenrolling

## 2023-11-09 NOTE — Telephone Encounter (Signed)
Form received and placed in PCP's inbox for ensure

## 2023-11-09 NOTE — Telephone Encounter (Signed)
Spoke to daughter on dpr. Takes average 2 day

## 2023-11-30 ENCOUNTER — Other Ambulatory Visit: Payer: Self-pay | Admitting: Family Medicine

## 2023-12-08 ENCOUNTER — Encounter: Payer: Self-pay | Admitting: Family Medicine

## 2023-12-08 MED ORDER — BREXPIPRAZOLE 0.5 MG PO TABS: ORAL_TABLET | ORAL | 0 refills | Status: DC

## 2023-12-09 ENCOUNTER — Telehealth: Payer: Self-pay

## 2023-12-09 NOTE — Telephone Encounter (Signed)
Copied from CRM [id number]. Topic: Clinical - Prescription Issue >> Dec 09, 2023  4:00 PM Elizebeth Brooking wrote: Reason for CRM: [facility] has called about the medication that is being prescribe to the patient Brexpiprazole 0.5 MG TABS they stated they are unable to prescribe it that why as insurance will not allow it . [place] [facility] is requesting a callback from Dr.Tower or Nurses for further clarification

## 2023-12-09 NOTE — Telephone Encounter (Signed)
Rtn call to [facility]. Spoke with Olegario Shearer about rx. States pt's insurance will not cover brexpiprazole (Rexulti) as written by Dr Milinda Antis. However, a "dummy" claim was processed as 1 mg tab to take 0.5 mg daily for 7 days, then 1 tab daily and it went through. Says pt's ins co covers 30 for 30. They wanted to make sure Dr Milinda Antis is ok with this. If not, Olegario Shearer says she will be at work tomorrow afternoon to discuss further.

## 2023-12-09 NOTE — Telephone Encounter (Signed)
Yes, that is fine, thanks  Do I need to re send it ?

## 2023-12-10 ENCOUNTER — Other Ambulatory Visit: Payer: Self-pay | Admitting: Family Medicine

## 2023-12-10 NOTE — Telephone Encounter (Signed)
I sent it  Let me know if any problems  

## 2023-12-10 NOTE — Telephone Encounter (Signed)
Called and spoke with [facility], Olegario Shearer is sending over a request with what is needed in the comments to resend prescription.

## 2023-12-24 ENCOUNTER — Other Ambulatory Visit: Payer: Self-pay | Admitting: Family Medicine

## 2024-01-05 ENCOUNTER — Other Ambulatory Visit (HOSPITAL_COMMUNITY): Payer: Self-pay

## 2024-01-07 MED ORDER — BREXPIPRAZOLE 2 MG PO TABS: 2.0000 mg | ORAL_TABLET | Freq: Every day | ORAL | 1 refills | Status: DC

## 2024-01-07 NOTE — Addendum Note (Signed)
Addended by: Roxy Manns A on: 01/07/2024 08:34 AM   Modules accepted: Orders

## 2024-01-13 ENCOUNTER — Encounter: Payer: Self-pay | Admitting: Family Medicine

## 2024-01-13 ENCOUNTER — Ambulatory Visit: Payer: Self-pay | Admitting: Family Medicine

## 2024-01-13 ENCOUNTER — Ambulatory Visit: Payer: 59 | Admitting: Family Medicine

## 2024-01-13 ENCOUNTER — Other Ambulatory Visit: Payer: Self-pay | Admitting: Family Medicine

## 2024-01-13 VITALS — BP 126/64 | HR 62 | Temp 97.8°F | Ht 60.0 in | Wt 122.0 lb

## 2024-01-13 DIAGNOSIS — M5442 Lumbago with sciatica, left side: Secondary | ICD-10-CM | POA: Diagnosis not present

## 2024-01-13 DIAGNOSIS — R600 Localized edema: Secondary | ICD-10-CM | POA: Diagnosis not present

## 2024-01-13 DIAGNOSIS — M5441 Lumbago with sciatica, right side: Secondary | ICD-10-CM

## 2024-01-13 DIAGNOSIS — R4182 Altered mental status, unspecified: Secondary | ICD-10-CM | POA: Insufficient documentation

## 2024-01-13 DIAGNOSIS — G8929 Other chronic pain: Secondary | ICD-10-CM

## 2024-01-13 DIAGNOSIS — I8393 Asymptomatic varicose veins of bilateral lower extremities: Secondary | ICD-10-CM

## 2024-01-13 DIAGNOSIS — F03B4 Unspecified dementia, moderate, with anxiety: Secondary | ICD-10-CM

## 2024-01-13 DIAGNOSIS — R7989 Other specified abnormal findings of blood chemistry: Secondary | ICD-10-CM

## 2024-01-13 DIAGNOSIS — R1031 Right lower quadrant pain: Secondary | ICD-10-CM | POA: Insufficient documentation

## 2024-01-13 LAB — CBC WITH DIFFERENTIAL/PLATELET
Basophils Absolute: 0 10*3/uL (ref 0.0–0.1)
Basophils Relative: 0.5 % (ref 0.0–3.0)
Eosinophils Absolute: 0.2 10*3/uL (ref 0.0–0.7)
Eosinophils Relative: 2.6 % (ref 0.0–5.0)
HCT: 31.1 % — ABNORMAL LOW (ref 36.0–46.0)
Hemoglobin: 10.1 g/dL — ABNORMAL LOW (ref 12.0–15.0)
Lymphocytes Relative: 23.5 % (ref 12.0–46.0)
Lymphs Abs: 1.4 10*3/uL (ref 0.7–4.0)
MCHC: 32.6 g/dL (ref 30.0–36.0)
MCV: 95.8 fL (ref 78.0–100.0)
Monocytes Absolute: 0.6 10*3/uL (ref 0.1–1.0)
Monocytes Relative: 10.4 % (ref 3.0–12.0)
Neutro Abs: 3.7 10*3/uL (ref 1.4–7.7)
Neutrophils Relative %: 63 % (ref 43.0–77.0)
Platelets: 124 10*3/uL — ABNORMAL LOW (ref 150.0–400.0)
RBC: 3.24 Mil/uL — ABNORMAL LOW (ref 3.87–5.11)
RDW: 15.9 % — ABNORMAL HIGH (ref 11.5–15.5)
WBC: 5.9 10*3/uL (ref 4.0–10.5)

## 2024-01-13 LAB — TSH: TSH: 3.91 u[IU]/mL (ref 0.35–5.50)

## 2024-01-13 LAB — COMPREHENSIVE METABOLIC PANEL
ALT: 13 U/L (ref 0–35)
AST: 24 U/L (ref 0–37)
Albumin: 4.1 g/dL (ref 3.5–5.2)
Alkaline Phosphatase: 65 U/L (ref 39–117)
BUN: 27 mg/dL — ABNORMAL HIGH (ref 6–23)
CO2: 26 meq/L (ref 19–32)
Calcium: 9.3 mg/dL (ref 8.4–10.5)
Chloride: 104 meq/L (ref 96–112)
Creatinine, Ser: 1.14 mg/dL (ref 0.40–1.20)
GFR: 41.32 mL/min — ABNORMAL LOW (ref >60.00)
Glucose, Bld: 92 mg/dL (ref 70–99)
Potassium: 4.6 meq/L (ref 3.5–5.1)
Sodium: 140 meq/L (ref 135–145)
Total Bilirubin: 0.6 mg/dL (ref 0.2–1.2)
Total Protein: 7.1 g/dL (ref 6.0–8.3)

## 2024-01-13 LAB — POC URINALSYSI DIPSTICK (AUTOMATED)
Bilirubin, UA: NEGATIVE
Glucose, UA: NEGATIVE
Ketones, UA: NEGATIVE
Leukocytes, UA: NEGATIVE
Nitrite, UA: POSITIVE
Protein, UA: NEGATIVE
Spec Grav, UA: 1.02 (ref 1.010–1.025)
Urobilinogen, UA: 0.2 U/dL
pH, UA: 6 (ref 5.0–8.0)

## 2024-01-13 LAB — BRAIN NATRIURETIC PEPTIDE: Pro B Natriuretic peptide (BNP): 899 pg/mL — ABNORMAL HIGH (ref 0.0–100.0)

## 2024-01-13 LAB — AMMONIA: Ammonia: 24 umol/L (ref 11–35)

## 2024-01-13 LAB — POCT UA - MICROSCOPIC ONLY

## 2024-01-13 LAB — VITAMIN B12: Vitamin B-12: 510 pg/mL (ref 211–911)

## 2024-01-13 MED ORDER — SULFAMETHOXAZOLE-TRIMETHOPRIM 800-160 MG PO TABS: 1.0000 | ORAL_TABLET | Freq: Two times a day (BID) | ORAL | 0 refills | Status: DC

## 2024-01-13 MED ORDER — FUROSEMIDE 20 MG PO TABS: ORAL_TABLET | ORAL | 0 refills | Status: DC

## 2024-01-13 MED ORDER — POTASSIUM CHLORIDE CRYS ER 20 MEQ PO TBCR: EXTENDED_RELEASE_TABLET | ORAL | 0 refills | Status: DC

## 2024-01-13 MED ORDER — BREXPIPRAZOLE 1 MG PO TABS: ORAL_TABLET | ORAL | 3 refills | Status: DC

## 2024-01-13 NOTE — Assessment & Plan Note (Signed)
Chronic, this is a long-term issue for her and does not appear to be changed.

## 2024-01-13 NOTE — Assessment & Plan Note (Signed)
Acute, she has evidence of chronic venous insufficiency/varicose veins but recent changes are new.  There does not appear to be any association with Rexulti and the side effect of peripheral edema. There is no evidence of pulmonary edema.  We will evaluate with labs.  She will elevate feet above heart and wear compression hose.

## 2024-01-13 NOTE — Assessment & Plan Note (Signed)
Acute change in patient with baseline dementia Possible side effect to higher dose of Rexulti versus progressive dementia versus additional secondary cause. No urinary symptoms, some right lower quadrant pain on palpation possibly secondary to stool/gas.   Unlikely urinary tract infection but given urine culture will come back over the weekend I will prescribe Bactrim twice daily for 3 days to start if initial lab work returns normal later today. Will evaluate with lab work.

## 2024-01-13 NOTE — Telephone Encounter (Signed)
Will see patient.  Peripheral edema is not a known side effect of Rexulti but the increasing confusion could be.

## 2024-01-13 NOTE — Assessment & Plan Note (Signed)
Chronic, I am hesitant to stop Rexulti altogether since she did have significant initial benefit with agitation.  Will instead have granddaughter decrease dose back to 1 mg which she had seem to be tolerating well. Follow-up with PCP.

## 2024-01-13 NOTE — Telephone Encounter (Signed)
FYI to PCP and to Dr. Ermalene Searing who is seeing her today

## 2024-01-13 NOTE — Progress Notes (Signed)
Patient ID: TEANDREA CUBERO, female    DOB: 19-Mar-1927, 89 y.o.   MRN: [id number]  This visit was conducted in person.  BP 126/64   Pulse 62   Temp 97.8 F (36.6 C) (Oral)   Ht 5' (1.524 m)   Wt 122 lb (55.3 kg)   SpO2 93%   BMI 23.83 kg/m    CC:  Chief Complaint  Patient presents with   Leg Swelling    C/o B lateral leg swelling. Also, altered mental status.     Subjective:   HPI: CARRI BLAZEVICH is a 89 y.o. female  patient of Dr. Royden Purl with history of hypertension, dementia and stage III chronic kidney disease presenting on 01/13/2024 for Leg Swelling (C/o B lateral leg swelling. Also, altered mental status. )  She presents today with granddaughter Harlene Salts who she lives with.  They have noted new onset bilateral lower leg swelling 2 days ago,  improves some when waking  in AM. Some change in mental status in last week.. more tired feeling, possibly more confused about time of day.  No chest pain, no SOB, may been occ more wheezy, post nasal drip.  No skin infection.  No  urinary change, no odor.   Some lower right abdominal pain... but she has not had BM yet today.. only notes on exam  No fever.  Reviewed labs from October 12, 2023: Thyroid normal, complete metabolic panel showed normal liver and stable kidney function GFR 40.  In the last month they have started her on Rexulti (for agitation), in addition to Aricept and Namenda.  This med has helped her mood overall... increased to  2 mg in last week.  No past echocardiogram on record.  Blood pressure well-controlled on metoprolol XL 50 mg p.o. daily, losartan 50 mg daily BP Readings from Last 3 Encounters:  01/13/24 126/64  10/12/23 108/62  09/23/22 126/68    Stable weight overall. Wt Readings from Last 3 Encounters:  01/13/24 122 lb (55.3 kg)  10/12/23 116 lb (52.6 kg)  09/23/22 121 lb (54.9 kg)        Relevant past medical, surgical, family and social history reviewed and updated as indicated.  Interim medical history since our last visit reviewed. Allergies and medications reviewed and updated. Outpatient Medications Prior to Visit  Medication Sig Dispense Refill   acetaminophen (TYLENOL) 325 MG tablet Take 2 tablets (650 mg total) by mouth every 6 (six) hours as needed for mild pain (pain score 1-3 or temp > 100.5). (Patient taking differently: Take 650 mg by mouth every 6 (six) hours as needed for mild pain (pain score 1-3). Also takes 2 tablets at bedtime) 30 tablet 0   alum & mag hydroxide-simeth (MAALOX/MYLANTA) 200-200-20 MG/5ML suspension Take 30 mLs by mouth every 4 (four) hours as needed for indigestion. 355 mL 0   Calcium Carbonate-Vitamin D (CALCIUM-D PO) Take 1 tablet by mouth daily.     Cholecalciferol (VITAMIN D3 PO) Take 1,000 Units daily by mouth.      docusate sodium (COLACE) 100 MG capsule Take 1 capsule (100 mg total) by mouth 2 (two) times daily. 10 capsule 0   donepezil (ARICEPT) 10 MG tablet TAKE ONE TABLET BY MOUTH EVERY NIGHT AT BEDTIME 30 tablet 3   losartan (COZAAR) 50 MG tablet TAKE ONE TABLET (50 MG TOTAL) BY MOUTH DAILY. 90 tablet 2   melatonin 3 MG TABS tablet Take 1 tablet (3 mg total) by mouth at bedtime as needed (sleep).  20 tablet 0   memantine (NAMENDA) 5 MG tablet TAKE ONE TABLET BY MOUTH TWICE A DAY 180 tablet 1   metoprolol succinate (TOPROL-XL) 50 MG 24 hr tablet TAKE 1 TABLET BY MOUTH ONCE A DAY WITH OR IMMEDIATELY FOLLOWING A MEAL 90 tablet 1   brexpiprazole (REXULTI) 1 MG TABS tablet Take 1/2 tablet by mouth daily for 7 days then increase to 1 pill daily 30 tablet 3   brexpiprazole (REXULTI) 2 MG TABS tablet Take 1 tablet (2 mg total) by mouth daily. 30 tablet 1   Facility-Administered Medications Prior to Visit  Medication Dose Route Frequency Provider Last Rate Last Admin   denosumab (PROLIA) injection 60 mg  60 mg Subcutaneous Once Tower, Marne A, MD         Per HPI unless specifically indicated in ROS section below Review of Systems   Constitutional:  Negative for fatigue and fever.  HENT:  Negative for congestion.   Eyes:  Negative for pain.  Respiratory:  Negative for cough and shortness of breath.   Cardiovascular:  Negative for chest pain, palpitations and leg swelling.  Gastrointestinal:  Positive for abdominal pain.  Genitourinary:  Negative for dysuria and vaginal bleeding.  Musculoskeletal:  Positive for back pain.  Neurological:  Positive for weakness and numbness. Negative for syncope, light-headedness and headaches.  Psychiatric/Behavioral:  Negative for dysphoric mood.    Objective:  BP 126/64   Pulse 62   Temp 97.8 F (36.6 C) (Oral)   Ht 5' (1.524 m)   Wt 122 lb (55.3 kg)   SpO2 93%   BMI 23.83 kg/m   Wt Readings from Last 3 Encounters:  01/13/24 122 lb (55.3 kg)  10/12/23 116 lb (52.6 kg)  09/23/22 121 lb (54.9 kg)      Physical Exam Constitutional:      General: She is not in acute distress.    Appearance: Normal appearance. She is well-developed. She is not ill-appearing or toxic-appearing.  HENT:     Head: Normocephalic.     Right Ear: Hearing, tympanic membrane, ear canal and external ear normal. Tympanic membrane is not erythematous, retracted or bulging.     Left Ear: Hearing, tympanic membrane, ear canal and external ear normal. Tympanic membrane is not erythematous, retracted or bulging.     Nose: No mucosal edema or rhinorrhea.     Right Sinus: No maxillary sinus tenderness or frontal sinus tenderness.     Left Sinus: No maxillary sinus tenderness or frontal sinus tenderness.     Mouth/Throat:     Pharynx: Uvula midline.  Eyes:     General: Lids are normal. Lids are everted, no foreign bodies appreciated.     Conjunctiva/sclera: Conjunctivae normal.     Pupils: Pupils are equal, round, and reactive to light.  Neck:     Thyroid: No thyroid mass or thyromegaly.     Vascular: No carotid bruit.     Trachea: Trachea normal.  Cardiovascular:     Rate and Rhythm: Normal rate and  regular rhythm.     Pulses: Normal pulses.     Heart sounds: Normal heart sounds, S1 normal and S2 normal. No murmur heard.    No friction rub. No gallop.  Pulmonary:     Effort: Pulmonary effort is normal. No tachypnea or respiratory distress.     Breath sounds: Normal breath sounds. No decreased breath sounds, wheezing, rhonchi or rales.  Abdominal:     General: Bowel sounds are normal.  Palpations: Abdomen is soft.     Tenderness: There is abdominal tenderness in the right lower quadrant. There is no right CVA tenderness or left CVA tenderness.  Musculoskeletal:     Cervical back: Normal range of motion and neck supple.     Lumbar back: Tenderness present. No bony tenderness. Decreased range of motion.  Skin:    General: Skin is warm and dry.     Findings: No rash.  Neurological:     Mental Status: She is alert.  Psychiatric:        Mood and Affect: Mood is not anxious or depressed.        Speech: Speech normal.        Behavior: Behavior normal. Behavior is cooperative.        Thought Content: Thought content normal.        Judgment: Judgment normal.       Results for orders placed or performed in visit on 01/13/24  POCT Urinalysis Dipstick (Automated)   Collection Time: 01/13/24 11:35 AM  Result Value Ref Range   Color, UA yellow    Clarity, UA clear    Glucose, UA Negative Negative   Bilirubin, UA negative    Ketones, UA negative    Spec Grav, UA 1.020 1.010 - 1.025   Blood, UA +/-    pH, UA 6.0 5.0 - 8.0   Protein, UA Negative Negative   Urobilinogen, UA 0.2 0.2 or 1.0 E.U./dL   Nitrite, UA positive    Leukocytes, UA Negative Negative  POCT UA - Microscopic Only   Collection Time: 01/13/24  1:26 PM  Result Value Ref Range   WBC, Ur, HPF, POC     RBC, Urine, Miroscopic     Bacteria, U Microscopic     Mucus, UA     Epithelial cells, urine per micros many, contaminated    Crystals, Ur, HPF, POC     Casts, Ur, LPF, POC     Yeast, UA      Assessment and  Plan  Altered mental status, unspecified altered mental status type Assessment & Plan: Acute change in patient with baseline dementia Possible side effect to higher dose of Rexulti versus progressive dementia versus additional secondary cause. No urinary symptoms, some right lower quadrant pain on palpation possibly secondary to stool/gas.   Unlikely urinary tract infection but given urine culture will come back over the weekend I will prescribe Bactrim twice daily for 3 days to start if initial lab work returns normal later today. Will evaluate with lab work.   Orders: -     POCT Urinalysis Dipstick (Automated) -     Comprehensive metabolic panel -     CBC with Differential/Platelet -     Vitamin B12 -     TSH -     Brain natriuretic peptide -     Ammonia -     POCT UA - Microscopic Only -     Urine Culture  Peripheral edema Assessment & Plan: Acute, she has evidence of chronic venous insufficiency/varicose veins but recent changes are new.  There does not appear to be any association with Rexulti and the side effect of peripheral edema. There is no evidence of pulmonary edema.  We will evaluate with labs.  She will elevate feet above heart and wear compression hose.   Asymptomatic varicose veins of both lower extremities  Abdominal pain, RLQ Assessment & Plan: Acute, will evaluate with urine culture but this may most likely be secondary  to not yet having bowel movement today.  Orders: -     Urine Culture  Chronic low back pain with bilateral sciatica, unspecified back pain laterality Assessment & Plan: Chronic, this is a long-term issue for her and does not appear to be changed.   Moderate dementia with anxiety, unspecified dementia type [facility]) Assessment & Plan: Chronic, I am hesitant to stop Rexulti altogether since she did have significant initial benefit with agitation.  Will instead have granddaughter decrease dose back to 1 mg which she had seem to be tolerating  well. Follow-up with PCP.   Other orders -     Sulfamethoxazole-Trimethoprim; Take 1 tablet by mouth 2 (two) times daily.  Dispense: 6 tablet; Refill: 0 -     Brexpiprazole; Take 1 tablet daily  Dispense: 30 tablet; Refill: 3    No follow-ups on file.   Kerby Nora, MD

## 2024-01-13 NOTE — Assessment & Plan Note (Signed)
Acute, will evaluate with urine culture but this may most likely be secondary to not yet having bowel movement today.

## 2024-01-13 NOTE — Patient Instructions (Addendum)
Decrease Rexulti back to 1 mg daily.   Will treat for possible UTI with  Bactrim  x 3 days given culture will return on weekend.  Elevated legs above heart as much as able.  Wear compression hose 15-30 mm HG daily  to help with swelling.  We will call with lab and urine culture.

## 2024-01-13 NOTE — Telephone Encounter (Signed)
Information obtained from the granddaughter [place], [place] complete for the pt.   Chief Complaint: bilateral ankle swelling Symptoms: swelling Frequency: 2 days Pertinent Negatives: Patient denies SOB, fever, pain in legs  Disposition: [] ED /[] Urgent Care (no appt availability in office) / [x] Appointment(In office/virtual)/ []  [facility]/ [] Home Care/ [] Refused Recommended Disposition /[] [facility]/ []  Follow-up with PCP  Additional Notes: Per granddaughter, swelling noted to the bilateral ankle area. No hx of CHF, no lung problems. Granddaughter states that pt is active per her usual. Amanda Salas also confirms that pt has been more confused as of late. Amanda Salas states that she is wondering if this has anything to do with her new dementia medication that she started end of dec. Swelling decreases when pt elevates her BLE.   Copied from CRM [id number]. Topic: Clinical - Red Word Triage >> Jan 13, 2024  8:10 AM Amanda Salas wrote: Red Word that prompted transfer to Nurse Triage: Patient's granddaughter, Amanda Salas, stating that her grandmother's feet & ankles are swollen. Has spoken with Dr. Milinda Antis via MyChart already. Reason for Disposition . MILD or MODERATE ankle swelling (e.g., can't move joint normally, can't do usual activities) (Exceptions: Itchy, localized swelling; swelling is chronic.)  Answer Assessment - Initial Assessment Questions 1. LOCATION: "Which ankle is swollen?" "Where is the swelling?"     Bilateral ankles 2. ONSET: "When did the swelling start?"     2 days 3. SWELLING: "How bad is the swelling?" Or, "How large is it?" (e.g., mild, moderate, severe; size of localized swelling)    - NONE: No joint swelling.   - LOCALIZED: Localized; small area of puffy or swollen skin (e.g., insect bite, skin irritation).   - MILD: Joint looks or feels mildly swollen or puffy.   - MODERATE: Swollen; interferes with normal activities (e.g., work or school); decreased range of movement;  may be limping.   - SEVERE: Very swollen; can't move swollen joint at all; limping a lot or unable to walk.     mild 4. PAIN: "Is there any pain?" If Yes, ask: "How bad is it?" (Scale 1-10; or mild, moderate, severe)   - NONE (0): no pain.   - MILD (1-3): doesn't interfere with normal activities.    - MODERATE (4-7): interferes with normal activities (e.g., work or school) or awakens from sleep, limping.    - SEVERE (8-10): excruciating pain, unable to do any normal activities, unable to walk.      Mild swelling 5. CAUSE: "What do you think caused the ankle swelling?"     New medication for dementia 6. OTHER SYMPTOMS: "Do you have any other symptoms?" (e.g., fever, chest pain, difficulty breathing, calf pain)     denies  Protocols used: Ankle Swelling-A-AH

## 2024-01-13 NOTE — Telephone Encounter (Signed)
Appt has been scheduled by call ctr

## 2024-01-14 ENCOUNTER — Encounter: Payer: Self-pay | Admitting: *Deleted

## 2024-01-15 LAB — URINE CULTURE
MICRO NUMBER:: 15989306
SPECIMEN QUALITY:: ADEQUATE

## 2024-01-18 NOTE — Progress Notes (Signed)
Noted.  Agree the best follow-up with PCP.

## 2024-01-19 ENCOUNTER — Encounter: Payer: Self-pay | Admitting: Family Medicine

## 2024-01-19 ENCOUNTER — Ambulatory Visit (INDEPENDENT_AMBULATORY_CARE_PROVIDER_SITE_OTHER): Payer: 59 | Admitting: Family Medicine

## 2024-01-19 ENCOUNTER — Ambulatory Visit (INDEPENDENT_AMBULATORY_CARE_PROVIDER_SITE_OTHER)
Admission: RE | Admit: 2024-01-19 | Discharge: 2024-01-19 | Disposition: A | Discharge status: Home / Self Care | Payer: 59 | Source: Ambulatory Visit | Attending: Family Medicine | Admitting: Family Medicine

## 2024-01-19 VITALS — BP 104/62 | HR 70 | Temp 97.7°F | Ht 60.0 in | Wt 120.4 lb

## 2024-01-19 DIAGNOSIS — I10 Essential (primary) hypertension: Secondary | ICD-10-CM | POA: Diagnosis not present

## 2024-01-19 DIAGNOSIS — R0609 Other forms of dyspnea: Secondary | ICD-10-CM

## 2024-01-19 DIAGNOSIS — N1831 Chronic kidney disease, stage 3a: Secondary | ICD-10-CM | POA: Diagnosis not present

## 2024-01-19 DIAGNOSIS — F03B4 Unspecified dementia, moderate, with anxiety: Secondary | ICD-10-CM | POA: Diagnosis not present

## 2024-01-19 DIAGNOSIS — R197 Diarrhea, unspecified: Secondary | ICD-10-CM | POA: Insufficient documentation

## 2024-01-19 DIAGNOSIS — D509 Iron deficiency anemia, unspecified: Secondary | ICD-10-CM

## 2024-01-19 DIAGNOSIS — R0602 Shortness of breath: Secondary | ICD-10-CM | POA: Diagnosis not present

## 2024-01-19 DIAGNOSIS — R600 Localized edema: Secondary | ICD-10-CM | POA: Diagnosis not present

## 2024-01-19 DIAGNOSIS — R1031 Right lower quadrant pain: Secondary | ICD-10-CM

## 2024-01-19 DIAGNOSIS — R4182 Altered mental status, unspecified: Secondary | ICD-10-CM

## 2024-01-19 MED ORDER — FUROSEMIDE 20 MG PO TABS: ORAL_TABLET | ORAL | 1 refills | Status: AC

## 2024-01-19 NOTE — Progress Notes (Signed)
Subjective:    Patient ID: Amanda Salas, female    DOB: Feb 11, 1927, 89 y.o.   MRN: [id number]  HPI  Wt Readings from Last 3 Encounters:  01/19/24 120 lb 6 oz (54.6 kg)  01/13/24 122 lb (55.3 kg)  10/12/23 116 lb (52.6 kg)   23.51 kg/m  Vitals:   01/19/24 1436  BP: 104/62  Pulse: 70  Temp: 97.7 F (36.5 C)  SpO2: 92%    Pt presents for follow up of pedal edema after appointment with Dr Ermalene Searing on 01/13/24 Also increased wheezing (today 02 sat of 92%)   The lasix 20 mg helped  Did not notice wheezing when on it  Swelling got better and then worse   A little less alert today  Some loose stool  (took bactrim for uti)- but may have started before     Labs were done  Office Visit on 01/13/2024  Component Date Value Ref Range Status  . Color, UA 01/13/2024 yellow   Final  . Clarity, UA 01/13/2024 clear   Final  . Glucose, UA 01/13/2024 Negative  Negative Final  . Bilirubin, UA 01/13/2024 negative   Final  . Ketones, UA 01/13/2024 negative   Final  . Spec Grav, UA 01/13/2024 1.020  1.010 - 1.025 Final  . Blood, UA 01/13/2024 +/-   Final   10 Ery/uL  . pH, UA 01/13/2024 6.0  5.0 - 8.0 Final  . Protein, UA 01/13/2024 Negative  Negative Final  . Urobilinogen, UA 01/13/2024 0.2  0.2 or 1.0 E.U./dL Final  . Nitrite, UA 40/98/1191 positive   Final  . Leukocytes, UA 01/13/2024 Negative  Negative Final  . Sodium 01/13/2024 140  135 - 145 mEq/L Final  . Potassium 01/13/2024 4.6  3.5 - 5.1 mEq/L Final  . Chloride 01/13/2024 104  96 - 112 mEq/L Final  . CO2 01/13/2024 26  19 - 32 mEq/L Final  . Glucose, Bld 01/13/2024 92  70 - 99 mg/dL Final  . BUN 47/82/9562 27 (H)  6 - 23 mg/dL Final  . Creatinine, Ser 01/13/2024 1.14  0.40 - 1.20 mg/dL Final  . Total Bilirubin 01/13/2024 0.6  0.2 - 1.2 mg/dL Final  . Alkaline Phosphatase 01/13/2024 65  39 - 117 U/L Final  . AST 01/13/2024 24  0 - 37 U/L Final  . ALT 01/13/2024 13  0 - 35 U/L Final  . Total Protein 01/13/2024 7.1   6.0 - 8.3 g/dL Final  . Albumin 13/07/6577 4.1  3.5 - 5.2 g/dL Final  . GFR 46/96/2952 41.32 (L)  >60.00 mL/min Final   Calculated using the CKD-EPI Creatinine Equation (2021)  . Calcium 01/13/2024 9.3  8.4 - 10.5 mg/dL Final  . WBC 84/13/2440 5.9  4.0 - 10.5 K/uL Final  . RBC 01/13/2024 3.24 (L)  3.87 - 5.11 Mil/uL Final  . Hemoglobin 01/13/2024 10.1 (L)  12.0 - 15.0 g/dL Final  . HCT 10/17/2535 31.1 (L)  36.0 - 46.0 % Final  . MCV 01/13/2024 95.8  78.0 - 100.0 fl Final  . MCHC 01/13/2024 32.6  30.0 - 36.0 g/dL Final  . RDW 64/40/3474 15.9 (H)  11.5 - 15.5 % Final  . Platelets 01/13/2024 124.0 (L)  150.0 - 400.0 K/uL Final  . Neutrophils Relative % 01/13/2024 63.0  43.0 - 77.0 % Final  . Lymphocytes Relative 01/13/2024 23.5  12.0 - 46.0 % Final  . Monocytes Relative 01/13/2024 10.4  3.0 - 12.0 % Final  . Eosinophils Relative 01/13/2024 2.6  0.0 - 5.0 % Final  . Basophils Relative 01/13/2024 0.5  0.0 - 3.0 % Final  . Neutro Abs 01/13/2024 3.7  1.4 - 7.7 K/uL Final  . Lymphs Abs 01/13/2024 1.4  0.7 - 4.0 K/uL Final  . Monocytes Absolute 01/13/2024 0.6  0.1 - 1.0 K/uL Final  . Eosinophils Absolute 01/13/2024 0.2  0.0 - 0.7 K/uL Final  . Basophils Absolute 01/13/2024 0.0  0.0 - 0.1 K/uL Final  . Vitamin B-12 01/13/2024 510  211 - 911 pg/mL Final  . TSH 01/13/2024 3.91  0.35 - 5.50 uIU/mL Final  . Pro B Natriuretic peptide (BNP) 01/13/2024 899.0 (H)  0.0 - 100.0 pg/mL Final  . Ammonia 01/13/2024 24  11 - 35 umol/L Final  . Epithelial cells, urine per micros 01/13/2024 many, contaminated   Final  . MICRO NUMBER: 01/13/2024 [id number]   Final  . SPECIMEN QUALITY: 01/13/2024 Adequate   Final  . Sample Source 01/13/2024 NOT GIVEN   Final  . STATUS: 01/13/2024 FINAL   Final  . ISOLATE 1: 01/13/2024 Escherichia coli (A)   Final   Greater than 100,000 CFU/mL of Escherichia coli   Uti e coli   BNP high=possible CHF  Echo was ordered  Started on lasix 20 mg daily with K 3 days  New mild  anemia     Was treated with antibiotic   Has dementia  Taking Rexulti for agitation along with aricept and Namenda  Decreased the dose of rexulti due to possible side effects of pedal edema     Patient Active Problem List   Diagnosis Date Noted  . Diarrhea 01/19/2024  . Dyspnea on exertion 01/19/2024  . Altered mental status 01/13/2024  . Peripheral edema 01/13/2024  . Asymptomatic varicose veins of both lower extremities 01/13/2024  . Abdominal pain, RLQ 01/13/2024  . Mobility impaired 09/01/2023  . Estrogen deficiency 09/23/2022  . H/O fracture of femur 06/09/2020  . Stage 3a chronic kidney disease (HCC) 06/09/2020  . Elevated glucose 06/09/2020  . Medicare annual wellness visit, subsequent 08/30/2018  . Routine general medical examination at a health care facility 08/30/2018  . Advance care planning 08/30/2018  . H/O fracture of hip 10/30/2017  . Dystrophic nail 04/23/2015  . Anemia 04/17/2014  . History of vertebral compression fracture 12/26/2013  . Low back pain 11/22/2013  . 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 Dementia (HCC) 12/29/2010  . Vitamin D deficiency 07/01/2010  . FALL, HX OF 07/01/2010  . Hyperlipidemia 08/10/2008  . Essential hypertension 08/10/2008  . ALLERGIC RHINITIS 08/10/2008  . GERD 08/10/2008  . Osteoporosis 08/10/2008   Past Medical History:  Diagnosis Date  . Allergy   . Arthritis   . Cataract   . Dementia (HCC)   . Disorder of bone and cartilage, unspecified   . GERD (gastroesophageal reflux disease)   . Hepatitis   . Hyperlipidemia   . Hypertension   . Thrombocytopenia, unspecified (HCC)    Past Surgical History:  Procedure Laterality Date  . CARDIAC CATHETERIZATION    . ESOPHAGOGASTRODUODENOSCOPY    . INTRAMEDULLARY (IM) NAIL INTERTROCHANTERIC Left 06/09/2020  . INTRAMEDULLARY (IM) NAIL INTERTROCHANTERIC Left 06/09/2020   Procedure: INTRAMEDULLARY (IM) NAIL INTERTROCHANTRIC;  Surgeon: Tarry Kos, MD;  Location: [facility] OR;  Service:  Orthopedics;  Laterality: Left;  . TOTAL HIP ARTHROPLASTY Right 10/31/2017   Procedure: TOTAL HIP ARTHROPLASTY DIRECT ANTERIOR APPROACH;  Surgeon: Tarry Kos, MD;  Location: [facility] OR;  Service: Orthopedics;  Laterality: Right;   Social History  Tobacco Use  . Smoking status: Never  . Smokeless tobacco: Never  Vaping Use  . Vaping status: Never Used  Substance Use Topics  . Alcohol use: No    Alcohol/week: 0.0 standard drinks of alcohol  . Drug use: No   Family History  Problem Relation Age of Onset  . Alzheimer's disease Mother    Allergies  Allergen Reactions  . Ace Inhibitors     REACTION: cough   Current Outpatient Medications on File Prior to Visit  Medication Sig Dispense Refill  . acetaminophen (TYLENOL) 325 MG tablet Take 2 tablets (650 mg total) by mouth every 6 (six) hours as needed for mild pain (pain score 1-3 or temp > 100.5). (Patient taking differently: Take 650 mg by mouth every 6 (six) hours as needed for mild pain (pain score 1-3). Also takes 2 tablets at bedtime) 30 tablet 0  . alum & mag hydroxide-simeth (MAALOX/MYLANTA) 200-200-20 MG/5ML suspension Take 30 mLs by mouth every 4 (four) hours as needed for indigestion. 355 mL 0  . brexpiprazole (REXULTI) 1 MG TABS tablet Take 1 tablet daily 30 tablet 3  . Calcium Carbonate-Vitamin D (CALCIUM-D PO) Take 1 tablet by mouth daily.    . Cholecalciferol (VITAMIN D3 PO) Take 1,000 Units daily by mouth.     . docusate sodium (COLACE) 100 MG capsule Take 1 capsule (100 mg total) by mouth 2 (two) times daily. 10 capsule 0  . donepezil (ARICEPT) 10 MG tablet TAKE ONE TABLET BY MOUTH EVERY NIGHT AT BEDTIME 30 tablet 3  . losartan (COZAAR) 50 MG tablet TAKE ONE TABLET (50 MG TOTAL) BY MOUTH DAILY. 90 tablet 2  . melatonin 3 MG TABS tablet Take 1 tablet (3 mg total) by mouth at bedtime as needed (sleep). 20 tablet 0  . memantine (NAMENDA) 5 MG tablet TAKE ONE TABLET BY MOUTH TWICE A DAY 180 tablet 1  . metoprolol succinate  (TOPROL-XL) 50 MG 24 hr tablet TAKE 1 TABLET BY MOUTH ONCE A DAY WITH OR IMMEDIATELY FOLLOWING A MEAL 90 tablet 1  . potassium chloride SA (KLOR-CON M) 20 MEQ tablet 1 tablet daily along with potassium x 3 days 10 tablet 0   No current facility-administered medications on file prior to visit.    Review of Systems  Constitutional:  Positive for fatigue. Negative for activity change, appetite change, fever and unexpected weight change.  HENT:  Positive for postnasal drip and rhinorrhea. Negative for congestion, ear pain, sinus pressure and sore throat.   Eyes:  Negative for pain, redness and visual disturbance.  Respiratory:  Positive for shortness of breath and wheezing. Negative for cough, choking, chest tightness and stridor.        Occational hears wheezing from outside but not on lung exam  Cardiovascular:  Positive for leg swelling. Negative for chest pain and palpitations.  Gastrointestinal:  Positive for diarrhea. Negative for abdominal pain, blood in stool, constipation, nausea and rectal pain.  Endocrine: Negative for polydipsia and polyuria.  Genitourinary:  Negative for dysuria, frequency and urgency.       No more urine frequency than usual   Musculoskeletal:  Negative for arthralgias, back pain and myalgias.  Skin:  Negative for pallor and rash.  Allergic/Immunologic: Negative for environmental allergies.  Neurological:  Negative for dizziness, syncope and headaches.  Hematological:  Negative for adenopathy. Does not bruise/bleed easily.  Psychiatric/Behavioral:  Positive for agitation, confusion, decreased concentration and sleep disturbance. Negative for dysphoric mood. The patient is nervous/anxious.  Objective:   Physical Exam Constitutional:      General: She is not in acute distress.    Appearance: Normal appearance. She is well-developed and normal weight. She is not ill-appearing or diaphoretic.  HENT:     Head: Normocephalic and atraumatic.     Nose:  Rhinorrhea present.     Mouth/Throat:     Mouth: Mucous membranes are moist.     Pharynx: Oropharynx is clear.  Eyes:     General: No scleral icterus.       Right eye: No discharge.        Left eye: No discharge.     Conjunctiva/sclera: Conjunctivae normal.     Pupils: Pupils are equal, round, and reactive to light.  Neck:     Thyroid: No thyromegaly.     Vascular: No carotid bruit or JVD.  Cardiovascular:     Rate and Rhythm: Normal rate and regular rhythm.     Heart sounds: Normal heart sounds. No murmur heard.    No gallop.  Pulmonary:     Effort: Pulmonary effort is normal. No respiratory distress.     Breath sounds: Normal breath sounds. No stridor. No wheezing, rhonchi or rales.     Comments: Few crackles at bases - moreso on left   Upper airway sounds-causes throat wheezing sound  No wheezing or rhonchi on lung exam  No stridor  Abdominal:     General: There is no distension or abdominal bruit.     Palpations: Abdomen is soft.  Musculoskeletal:     Cervical back: Normal range of motion and neck supple. No tenderness.     Right lower leg: Edema present.     Left lower leg: Edema present.     Comments: One plus pitting edema bilat    Lymphadenopathy:     Cervical: No cervical adenopathy.  Skin:    General: Skin is warm and dry.     Coloration: Skin is not pale.     Findings: No rash.  Neurological:     Mental Status: She is alert.     Coordination: Coordination normal.     Deep Tendon Reflexes: Reflexes are normal and symmetric.     Comments: Baseline slow gait with assist   Psychiatric:        Attention and Perception: Attention normal.        Mood and Affect: Mood normal. Mood is not anxious or depressed.        Speech: Speech is tangential.        Behavior: Behavior is not agitated, slowed or withdrawn.        Cognition and Memory: Cognition is impaired. Memory is impaired.     Comments: Dementia noted Repeats herself Not agitated  Pleasant            Assessment & Plan:   Problem List Items Addressed This Visit       Cardiovascular and Mediastinum   Essential hypertension   bp in fair control at this time  BP Readings from Last 1 Encounters:  01/19/24 104/62   No changes needed unless pt c/o dizziness Most recent labs reviewed  Disc lifstyle change with low sodium diet and exercise  Losartan 50 mg daily  Metoprolol xl 50 mg daily   Plan to start back on lasix 20 mg for edema and possible CHF        Relevant Medications   furosemide (LASIX) 20 MG tablet   Other Relevant Orders   Basic  metabolic panel   CBC with Differential/Platelet     Nervous and Auditory   Dementia (HCC)   At 89 years old  Progressive  Taking  Aricept 10 mg daily  Namenda 5 mg bid  Added rexulti which we gradually titrated up (noted much improvement in agitation to start but then worse again when she got uti) Some ? If the 2 mg dose caused pedal edema so cut back to 1 now Monitoring closely  Family may want to consider placement soon  Will consult with SW  Also some other/new health problems adding to confusion          Genitourinary   Stage 3a chronic kidney disease (HCC) (Chronic)   Bmet today  Has taken lasix and K  Recent uti with bactrim treatment - expect numbers to look worse         Other   Peripheral edema - Primary   Unsure if this was originally caused by rexalti and dose cut from 2 to 1 mg daily  BNP elevated - echocardiogram pending  Reviewed note/w/u from Dr Ermalene Searing   Did improve with lasix and return upon stopping it  Will start this back at 20 mg daily  Lab today  Then decide on K supplementation/ pt has ckd   Encouraged to elevate legs  Call back and Er precautions noted in detail today        Relevant Orders   Basic metabolic panel   CBC with Differential/Platelet   Dyspnea on exertion   Recent  Elevated BNP Did improve on short lasix course and worsened again Reviewed notes /labs from Dr  Ermalene Searing Echocardiogram planned next month   Already on beta blocker and arb  Will re start lasix 20 today  Cxr was clear  Pulse ox 92 %  Pending labs today re: advisement for K dose  Has CKD   Family unsure how aggressive they would want to be re: cardiac eval or treatment at her age of 47  Reassuring exam  Complex medical picture  Call back and Er precautions noted in detail today        Relevant Orders   DG Chest 2 View (Completed)   Diarrhea   Recent May have been worsened by bactrim ds for uti  Encouraged trial of probiotic  Also low hb (given stool cards)  No black or bloody stool No abd pain  Reassuring exam  Bmet and cbc today  Call back and Er precautions noted in detail today        Relevant Orders   CBC with Differential/Platelet   Anemia   New Lab Results  Component Value Date   WBC 5.9 01/13/2024   HGB 10.1 (L) 01/13/2024   HCT 31.1 (L) 01/13/2024   MCV 95.8 01/13/2024   PLT 124.0 (L) 01/13/2024  Low hb  Low plt  Will check ferritin and iron today along with repeat cbc  Given heme stool cards as well  Did have recent uti        Relevant Orders   Iron   Ferritin   Hemoccult Cards (X3 cards)   Altered mental status   Suspect due to both dementia and uti  Some improvement after treatment of uti but dementia continues to progress  Today is cooperative and pleasant       Abdominal pain, RLQ   Was treated for uti  No tenderness today  Has c/o loose stools lately

## 2024-01-19 NOTE — Assessment & Plan Note (Signed)
Recent May have been worsened by bactrim ds for uti  Encouraged trial of probiotic  Also low hb (given stool cards)  No black or bloody stool No abd pain  Reassuring exam  Bmet and cbc today  Call back and Er precautions noted in detail today

## 2024-01-19 NOTE — Assessment & Plan Note (Signed)
At 89 years old  Progressive  Taking  Aricept 10 mg daily  Namenda 5 mg bid  Added rexulti which we gradually titrated up (noted much improvement in agitation to start but then worse again when she got uti) Some ? If the 2 mg dose caused pedal edema so cut back to 1 now Monitoring closely  Family may want to consider placement soon  Will consult with SW  Also some other/new health problems adding to confusion

## 2024-01-19 NOTE — Patient Instructions (Addendum)
Lab today for chem , cbc , and iron levels Please do the 3 heme cards if you can   Start back on lasix  Based on lab results will make a plan for potassium   Chest xray today   If any symptoms become severe go to the ER   I will re place a referral for social work

## 2024-01-19 NOTE — Assessment & Plan Note (Signed)
New Lab Results  Component Value Date   WBC 5.9 01/13/2024   HGB 10.1 (L) 01/13/2024   HCT 31.1 (L) 01/13/2024   MCV 95.8 01/13/2024   PLT 124.0 (L) 01/13/2024  Low hb  Low plt  Will check ferritin and iron today along with repeat cbc  Given heme stool cards as well  Did have recent uti

## 2024-01-19 NOTE — Assessment & Plan Note (Addendum)
Bmet today  Has taken lasix and K  Recent uti with bactrim treatment - expect numbers to look worse

## 2024-01-19 NOTE — Assessment & Plan Note (Addendum)
Recent  Elevated BNP Did improve on short lasix course and worsened again Reviewed notes /labs from Dr Ermalene Searing Echocardiogram planned next month   Already on beta blocker and arb  Will re start lasix 20 today  Cxr was clear  Pulse ox 92 %  Pending labs today re: advisement for K dose  Has CKD   Family unsure how aggressive they would want to be re: cardiac eval or treatment at her age of 56  Reassuring exam  Complex medical picture  Call back and Er precautions noted in detail today

## 2024-01-19 NOTE — Assessment & Plan Note (Signed)
Unsure if this was originally caused by rexalti and dose cut from 2 to 1 mg daily  BNP elevated - echocardiogram pending  Reviewed note/w/u from Dr Ermalene Searing   Did improve with lasix and return upon stopping it  Will start this back at 20 mg daily  Lab today  Then decide on K supplementation/ pt has ckd   Encouraged to elevate legs  Call back and Er precautions noted in detail today

## 2024-01-19 NOTE — Assessment & Plan Note (Signed)
bp in fair control at this time  BP Readings from Last 1 Encounters:  01/19/24 104/62   No changes needed unless pt c/o dizziness Most recent labs reviewed  Disc lifstyle change with low sodium diet and exercise  Losartan 50 mg daily  Metoprolol xl 50 mg daily   Plan to start back on lasix 20 mg for edema and possible CHF

## 2024-01-19 NOTE — Assessment & Plan Note (Signed)
Suspect due to both dementia and uti  Some improvement after treatment of uti but dementia continues to progress  Today is cooperative and pleasant

## 2024-01-19 NOTE — Assessment & Plan Note (Signed)
Was treated for uti  No tenderness today  Has c/o loose stools lately

## 2024-01-20 ENCOUNTER — Encounter: Payer: Self-pay | Admitting: Family Medicine

## 2024-01-20 ENCOUNTER — Other Ambulatory Visit (INDEPENDENT_AMBULATORY_CARE_PROVIDER_SITE_OTHER): Payer: 59

## 2024-01-20 ENCOUNTER — Telehealth: Payer: Self-pay | Admitting: *Deleted

## 2024-01-20 DIAGNOSIS — D509 Iron deficiency anemia, unspecified: Secondary | ICD-10-CM

## 2024-01-20 LAB — CBC WITH DIFFERENTIAL/PLATELET
Basophils Absolute: 0.1 10*3/uL (ref 0.0–0.1)
Basophils Relative: 0.8 % (ref 0.0–3.0)
Eosinophils Absolute: 0.2 10*3/uL (ref 0.0–0.7)
Eosinophils Relative: 1.9 % (ref 0.0–5.0)
HCT: 30.3 % — ABNORMAL LOW (ref 36.0–46.0)
Hemoglobin: 10 g/dL — ABNORMAL LOW (ref 12.0–15.0)
Lymphocytes Relative: 11.9 % — ABNORMAL LOW (ref 12.0–46.0)
Lymphs Abs: 1 10*3/uL (ref 0.7–4.0)
MCHC: 32.9 g/dL (ref 30.0–36.0)
MCV: 95.4 fL (ref 78.0–100.0)
Monocytes Absolute: 0.9 10*3/uL (ref 0.1–1.0)
Monocytes Relative: 10.7 % (ref 3.0–12.0)
Neutro Abs: 6.6 10*3/uL (ref 1.4–7.7)
Neutrophils Relative %: 74.7 % (ref 43.0–77.0)
Platelets: 145 10*3/uL — ABNORMAL LOW (ref 150.0–400.0)
RBC: 3.17 Mil/uL — ABNORMAL LOW (ref 3.87–5.11)
RDW: 16 % — ABNORMAL HIGH (ref 11.5–15.5)
WBC: 8.8 10*3/uL (ref 4.0–10.5)

## 2024-01-20 LAB — BASIC METABOLIC PANEL
BUN: 24 mg/dL — ABNORMAL HIGH (ref 6–23)
CO2: 25 meq/L (ref 19–32)
Calcium: 9.1 mg/dL (ref 8.4–10.5)
Chloride: 100 meq/L (ref 96–112)
Creatinine, Ser: 1.55 mg/dL — ABNORMAL HIGH (ref 0.40–1.20)
GFR: 28.58 mL/min — ABNORMAL LOW (ref >60.00)
Glucose, Bld: 91 mg/dL (ref 70–99)
Potassium: 5 meq/L (ref 3.5–5.1)
Sodium: 135 meq/L (ref 135–145)

## 2024-01-20 LAB — HEMOCCULT SLIDES (X 3 CARDS)
Fecal Occult Blood: NEGATIVE
OCCULT 1: NEGATIVE
OCCULT 2: NEGATIVE
OCCULT 3: POSITIVE — AB
OCCULT 4: NEGATIVE
OCCULT 5: NEGATIVE

## 2024-01-20 LAB — FERRITIN: Ferritin: 210.3 ng/mL (ref 10.0–291.0)

## 2024-01-20 LAB — IRON: Iron: 18 ug/dL — ABNORMAL LOW (ref 42–145)

## 2024-01-20 NOTE — Progress Notes (Signed)
Complex Care Management Note  Care Guide Note 01/20/2024 Name: [name] MRN: [id number] DOB: February 16, 1927  Amanda Salas is a 89 y.o. year old female who sees Tower, Audrie Gallus, MD for primary care. I reached out to Amanda Salas by phone today to offer complex care management services.  [name] was given information about Complex Care Management services today including:   The Complex Care Management services include support from the care team which includes your Nurse Coordinator, Clinical Social Worker, or Pharmacist.  The Complex Care Management team is here to help remove barriers to the health concerns and goals most important to you. Complex Care Management services are voluntary, and the patient may decline or stop services at any time by request to their care team member.   Complex Care Management Consent Status: Patient agreed to services and verbal consent obtained.   Follow up plan:  Telephone appointment with complex care management team member scheduled for:  01/25/2024  Encounter Outcome:  Patient Scheduled  Burman Nieves, CMA, Care Guide [facility]  [facility] Guide Direct Dial: [phone]  Fax: [phone] Website: [email or web address]

## 2024-01-24 ENCOUNTER — Encounter: Payer: Self-pay | Admitting: Family Medicine

## 2024-01-24 ENCOUNTER — Ambulatory Visit: Payer: 59 | Admitting: Family Medicine

## 2024-01-24 VITALS — BP 116/64 | HR 69 | Temp 98.0°F | Ht 60.0 in | Wt 118.5 lb

## 2024-01-24 DIAGNOSIS — R600 Localized edema: Secondary | ICD-10-CM

## 2024-01-24 DIAGNOSIS — F03B4 Unspecified dementia, moderate, with anxiety: Secondary | ICD-10-CM

## 2024-01-24 DIAGNOSIS — N1831 Chronic kidney disease, stage 3a: Secondary | ICD-10-CM | POA: Diagnosis not present

## 2024-01-24 DIAGNOSIS — R0609 Other forms of dyspnea: Secondary | ICD-10-CM | POA: Diagnosis not present

## 2024-01-24 DIAGNOSIS — I10 Essential (primary) hypertension: Secondary | ICD-10-CM

## 2024-01-24 DIAGNOSIS — D509 Iron deficiency anemia, unspecified: Secondary | ICD-10-CM | POA: Diagnosis not present

## 2024-01-24 NOTE — Progress Notes (Signed)
Subjective:    Patient ID: Amanda Salas, female    DOB: 08-24-1927, 89 y.o.   MRN: [id number]  HPI  Wt Readings from Last 3 Encounters:  01/24/24 118 lb 8 oz (53.8 kg)  01/19/24 120 lb 6 oz (54.6 kg)  01/13/24 122 lb (55.3 kg)   23.14 kg/m  Vitals:   01/24/24 1434  BP: 116/64  Pulse: 69  Temp: 98 F (36.7 C)  SpO2: 94%    Pt presents for follow up of pedal edema / ckd/ HTN/ dementia , doe, diarrhea , anemia    We started back on lasix for pedal edema and shortness of breath on exertion  With elevated pnd   Echo is scheduled 2/17  Ankles may be a bit better but still wearing bedroom slippers  Breathing is better  Less shortness of breath getting around   Tries to get her to drink fluids    Chest xray DG Chest 2 View Result Date: 01/19/2024 CLINICAL DATA:  Shortness of breath. EXAM: CHEST - 2 VIEW COMPARISON:  June 09, 2020. FINDINGS: The heart size and mediastinal contours are within normal limits. Both lungs are clear. The visualized skeletal structures are unremarkable. IMPRESSION: No active cardiopulmonary disease. Electronically Signed   By: Lupita Raider M.D.   On: 01/19/2024 16:39   HTN bp is stable today  No cp or palpitations or headaches or edema  No side effects to medicines  BP Readings from Last 3 Encounters:  01/24/24 116/64  01/19/24 104/62  01/13/24 126/64    Losartan 50 mg daily  Metoprolol xl 50 mg daily   Ckd Lab Results  Component Value Date   NA 135 01/19/2024   K 5.0 01/19/2024   CO2 25 01/19/2024   GLUCOSE 91 01/19/2024   BUN 24 (H) 01/19/2024   CREATININE 1.55 (H) 01/19/2024   CALCIUM 9.1 01/19/2024   GFR 28.58 (L) 01/19/2024   GFRNONAA 59 (L) 06/12/2020  GFR down significantly  Had been on lasix for several days Had a week of bactrim for uti    Dementia  Rexulti at 1 mg (? If 2 mg dose worsened pedal edema)  Continues aricept and namenda  Overall agitation is a lot better   Did place SW referral     Anemia Heme cards done-one of the 5 was positive  Diarrhea got better over weekend  One dose of immodium  Wonders if she had a virus   Lab Results  Component Value Date   WBC 8.8 01/19/2024   HGB 10.0 (L) 01/19/2024   HCT 30.3 (L) 01/19/2024   MCV 95.4 01/19/2024   PLT 145.0 (L) 01/19/2024   Lab Results  Component Value Date   IRON 18 (L) 01/19/2024   FERRITIN 210.3 01/19/2024      Patient Active Problem List   Diagnosis Date Noted   Diarrhea 01/19/2024   Dyspnea on exertion 01/19/2024   Peripheral edema 01/13/2024   Asymptomatic varicose veins of both lower extremities 01/13/2024   Mobility impaired 09/01/2023   Estrogen deficiency 09/23/2022   H/O fracture of femur 06/09/2020   Stage 3a chronic kidney disease (HCC) 06/09/2020   Elevated glucose 06/09/2020   Medicare annual wellness visit, subsequent 08/30/2018   Routine general medical examination at a health care facility 08/30/2018   Advance care planning 08/30/2018   H/O fracture of hip 10/30/2017   Dystrophic nail 04/23/2015   Anemia 04/17/2014   History of vertebral compression fracture 12/26/2013   Low back pain  11/22/2013   Mycotic toenails 11/04/2011   Dementia (HCC) 12/29/2010   Vitamin D deficiency 07/01/2010   FALL, HX OF 07/01/2010   Hyperlipidemia 08/10/2008   Essential hypertension 08/10/2008   ALLERGIC RHINITIS 08/10/2008   GERD 08/10/2008   Osteoporosis 08/10/2008   Past Medical History:  Diagnosis Date   Allergy    Arthritis    Cataract    Dementia (HCC)    Disorder of bone and cartilage, unspecified    GERD (gastroesophageal reflux disease)    Hepatitis    Hyperlipidemia    Hypertension    Thrombocytopenia, unspecified (HCC)    Past Surgical History:  Procedure Laterality Date   CARDIAC CATHETERIZATION     ESOPHAGOGASTRODUODENOSCOPY     INTRAMEDULLARY (IM) NAIL INTERTROCHANTERIC Left 06/09/2020   INTRAMEDULLARY (IM) NAIL INTERTROCHANTERIC Left 06/09/2020   Procedure:  INTRAMEDULLARY (IM) NAIL INTERTROCHANTRIC;  Surgeon: Tarry Kos, MD;  Location: [facility] OR;  Service: Orthopedics;  Laterality: Left;   TOTAL HIP ARTHROPLASTY Right 10/31/2017   Procedure: TOTAL HIP ARTHROPLASTY DIRECT ANTERIOR APPROACH;  Surgeon: Tarry Kos, MD;  Location: [facility] OR;  Service: Orthopedics;  Laterality: Right;   Social History   Tobacco Use   Smoking status: Never   Smokeless tobacco: Never  Vaping Use   Vaping status: Never Used  Substance Use Topics   Alcohol use: No    Alcohol/week: 0.0 standard drinks of alcohol   Drug use: No   Family History  Problem Relation Age of Onset   Alzheimer's disease Mother    Allergies  Allergen Reactions   Ace Inhibitors     REACTION: cough   Current Outpatient Medications on File Prior to Visit  Medication Sig Dispense Refill   acetaminophen (TYLENOL) 325 MG tablet Take 2 tablets (650 mg total) by mouth every 6 (six) hours as needed for mild pain (pain score 1-3 or temp > 100.5). (Patient taking differently: Take 650 mg by mouth every 6 (six) hours as needed for mild pain (pain score 1-3). Also takes 2 tablets at bedtime) 30 tablet 0   alum & mag hydroxide-simeth (MAALOX/MYLANTA) 200-200-20 MG/5ML suspension Take 30 mLs by mouth every 4 (four) hours as needed for indigestion. 355 mL 0   brexpiprazole (REXULTI) 1 MG TABS tablet Take 1 tablet daily 30 tablet 3   Calcium Carbonate-Vitamin D (CALCIUM-D PO) Take 1 tablet by mouth daily.     Cholecalciferol (VITAMIN D3 PO) Take 1,000 Units daily by mouth.      docusate sodium (COLACE) 100 MG capsule Take 1 capsule (100 mg total) by mouth 2 (two) times daily. 10 capsule 0   donepezil (ARICEPT) 10 MG tablet TAKE ONE TABLET BY MOUTH EVERY NIGHT AT BEDTIME 30 tablet 3   furosemide (LASIX) 20 MG tablet 1 tablet daily along with potassium x 3 days 30 tablet 1   losartan (COZAAR) 50 MG tablet TAKE ONE TABLET (50 MG TOTAL) BY MOUTH DAILY. 90 tablet 2   melatonin 3 MG TABS tablet Take 1 tablet (3  mg total) by mouth at bedtime as needed (sleep). 20 tablet 0   memantine (NAMENDA) 5 MG tablet TAKE ONE TABLET BY MOUTH TWICE A DAY 180 tablet 1   metoprolol succinate (TOPROL-XL) 50 MG 24 hr tablet TAKE 1 TABLET BY MOUTH ONCE A DAY WITH OR IMMEDIATELY FOLLOWING A MEAL 90 tablet 1   potassium chloride SA (KLOR-CON M) 20 MEQ tablet 1 tablet daily along with potassium x 3 days 10 tablet 0   No current facility-administered  medications on file prior to visit.    Review of Systems  Constitutional:  Positive for fatigue. Negative for activity change, appetite change, fever and unexpected weight change.  HENT:  Negative for congestion, ear pain, rhinorrhea, sinus pressure and sore throat.   Eyes:  Negative for pain, redness and visual disturbance.  Respiratory:  Negative for cough, shortness of breath and wheezing.   Cardiovascular:  Negative for chest pain and palpitations.  Gastrointestinal:  Negative for abdominal pain, blood in stool, constipation and diarrhea.  Endocrine: Negative for polydipsia and polyuria.  Genitourinary:  Negative for dysuria, frequency and urgency.  Musculoskeletal:  Positive for arthralgias and back pain. Negative for myalgias.  Skin:  Negative for pallor and rash.  Allergic/Immunologic: Negative for environmental allergies.  Neurological:  Negative for dizziness, syncope and headaches.  Hematological:  Negative for adenopathy. Does not bruise/bleed easily.  Psychiatric/Behavioral:  Positive for confusion and decreased concentration. Negative for agitation and dysphoric mood. The patient is not nervous/anxious.        Baseline dementia Agitation is much improved        Objective:   Physical Exam Constitutional:      General: She is not in acute distress.    Appearance: Normal appearance. She is well-developed and normal weight. She is not ill-appearing or diaphoretic.  HENT:     Head: Normocephalic and atraumatic.     Mouth/Throat:     Mouth: Mucous membranes  are moist.  Eyes:     General: No scleral icterus.    Conjunctiva/sclera: Conjunctivae normal.     Pupils: Pupils are equal, round, and reactive to light.  Neck:     Thyroid: No thyromegaly.     Vascular: No carotid bruit or JVD.  Cardiovascular:     Rate and Rhythm: Normal rate and regular rhythm.     Heart sounds: Normal heart sounds.     No gallop.  Pulmonary:     Effort: Pulmonary effort is normal. No respiratory distress.     Breath sounds: Normal breath sounds. No stridor. No wheezing, rhonchi or rales.     Comments: No crackles or rales  Abdominal:     General: There is no distension or abdominal bruit.     Palpations: Abdomen is soft. There is no mass.     Tenderness: There is no abdominal tenderness. There is no guarding or rebound.  Musculoskeletal:     Cervical back: Normal range of motion and neck supple.     Right lower leg: Edema present.     Left lower leg: Edema present.     Comments: Pedal edema is mildly improved Still trace to 1 plus bilaterally   Lymphadenopathy:     Cervical: No cervical adenopathy.  Skin:    General: Skin is warm and dry.     Coloration: Skin is not pale.     Findings: No rash.  Neurological:     Mental Status: She is alert.     Cranial Nerves: No cranial nerve deficit.     Coordination: Coordination normal.     Deep Tendon Reflexes: Reflexes are normal and symmetric. Reflexes normal.  Psychiatric:        Attention and Perception: Attention normal.        Mood and Affect: Mood normal. Mood is not anxious or depressed.        Cognition and Memory: Cognition is impaired. Memory is impaired.     Comments: Acting tired today  Alert  Assessment & Plan:   Problem List Items Addressed This Visit       Cardiovascular and Mediastinum   Essential hypertension   bp in fair control at this time  BP Readings from Last 1 Encounters:  01/24/24 116/64   No changes needed unless pt c/o dizziness Most recent labs reviewed   Disc lifstyle change with low sodium diet and exercise  Losartan 50 mg daily  Metoprolol xl 50 mg daily  Lasix 20 mg daily   Bmet pending        Relevant Orders   CBC with Differential/Platelet   Basic metabolic panel     Nervous and Auditory   Dementia (HCC)   Mental status and agitation have improved since last visit  On rexlulti 1 mg  (pt and family aware this can increase mortality in this age group) Has improved her quality of life and wants to continue Perhaps uti or GI virus added to her MS change last time  SW referral done last time as well         Genitourinary   Stage 3a chronic kidney disease (HCC) (Chronic)   Last time GFR decreased markedly to 28.5  This was after uti and also course of bactrim  Fluid intake is fair  Continues lasix 20 mg for pedal edema   Re check today  Consider nephrology if not improving   Encouraged fluid intake       Relevant Orders   Basic metabolic panel     Other   Peripheral edema - Primary   This is mildly improved with lasix 20 mg daily  In setting of elevated bnp and worsened renal fxn   No shortness of breath (that has improved) Reassuring cxr last time   Echo upcoming  Labs today for repeat bmet and bnp      Relevant Orders   Basic metabolic panel   Brain natriuretic peptide   Dyspnea on exertion   Resolved today  Taking lasix 20 mg daily  Cxr was reassuring       Anemia   Last hb 10.0  Iron low at 18, ferritin normal  One of 5 stool heme cards was positive in setting of bout of diarrhea  Also renal insuff Would like to avoid aggressive work up in pt in her 90s unless symptomatic  Reassuring exam today  Re check cbc        Relevant Orders   CBC with Differential/Platelet

## 2024-01-24 NOTE — Assessment & Plan Note (Addendum)
Mental status and agitation have improved since last visit  On rexlulti 1 mg  (pt and family aware this can increase mortality in this age group) Has improved her quality of life and wants to continue Perhaps uti or GI virus added to her MS change last time  SW referral done last time as well

## 2024-01-24 NOTE — Assessment & Plan Note (Signed)
Last hb 10.0  Iron low at 18, ferritin normal  One of 5 stool heme cards was positive in setting of bout of diarrhea  Also renal insuff Would like to avoid aggressive work up in pt in her 90s unless symptomatic  Reassuring exam today  Re check cbc

## 2024-01-24 NOTE — Assessment & Plan Note (Signed)
Last time GFR decreased markedly to 28.5  This was after uti and also course of bactrim  Fluid intake is fair  Continues lasix 20 mg for pedal edema   Re check today  Consider nephrology if not improving   Encouraged fluid intake

## 2024-01-24 NOTE — Patient Instructions (Signed)
Continue current medicines for now   Labs today for chemistries (bmet / GFR/ renal/ potassium ) and cbc  Watching renal function carefully   Let's plan to get the echo cardiogram as planned   Elevate feet when sitting  Watch for changes in breathing

## 2024-01-24 NOTE — Assessment & Plan Note (Signed)
Resolved today  Taking lasix 20 mg daily  Cxr was reassuring

## 2024-01-24 NOTE — Assessment & Plan Note (Signed)
bp in fair control at this time  BP Readings from Last 1 Encounters:  01/24/24 116/64   No changes needed unless pt c/o dizziness Most recent labs reviewed  Disc lifstyle change with low sodium diet and exercise  Losartan 50 mg daily  Metoprolol xl 50 mg daily  Lasix 20 mg daily   Bmet pending

## 2024-01-24 NOTE — Assessment & Plan Note (Signed)
This is mildly improved with lasix 20 mg daily  In setting of elevated bnp and worsened renal fxn   No shortness of breath (that has improved) Reassuring cxr last time   Echo upcoming  Labs today for repeat bmet and bnp

## 2024-01-25 ENCOUNTER — Encounter: Payer: Self-pay | Admitting: Family Medicine

## 2024-01-25 ENCOUNTER — Ambulatory Visit: Payer: Self-pay | Admitting: *Deleted

## 2024-01-25 LAB — BASIC METABOLIC PANEL
BUN: 27 mg/dL — ABNORMAL HIGH (ref 6–23)
CO2: 26 meq/L (ref 19–32)
Calcium: 8.9 mg/dL (ref 8.4–10.5)
Chloride: 101 meq/L (ref 96–112)
Creatinine, Ser: 1.29 mg/dL — ABNORMAL HIGH (ref 0.40–1.20)
GFR: 35.62 mL/min — ABNORMAL LOW (ref >60.00)
Glucose, Bld: 121 mg/dL — ABNORMAL HIGH (ref 70–99)
Potassium: 4.7 meq/L (ref 3.5–5.1)
Sodium: 138 meq/L (ref 135–145)

## 2024-01-25 LAB — CBC WITH DIFFERENTIAL/PLATELET
Basophils Absolute: 0.1 10*3/uL (ref 0.0–0.1)
Basophils Relative: 0.8 % (ref 0.0–3.0)
Eosinophils Absolute: 0.4 10*3/uL (ref 0.0–0.7)
Eosinophils Relative: 4.9 % (ref 0.0–5.0)
HCT: 31.2 % — ABNORMAL LOW (ref 36.0–46.0)
Hemoglobin: 10.3 g/dL — ABNORMAL LOW (ref 12.0–15.0)
Lymphocytes Relative: 26.6 % (ref 12.0–46.0)
Lymphs Abs: 2 10*3/uL (ref 0.7–4.0)
MCHC: 33.1 g/dL (ref 30.0–36.0)
MCV: 95 fL (ref 78.0–100.0)
Monocytes Absolute: 0.7 10*3/uL (ref 0.1–1.0)
Monocytes Relative: 9.4 % (ref 3.0–12.0)
Neutro Abs: 4.5 10*3/uL (ref 1.4–7.7)
Neutrophils Relative %: 58.3 % (ref 43.0–77.0)
Platelets: 182 10*3/uL (ref 150.0–400.0)
RBC: 3.29 Mil/uL — ABNORMAL LOW (ref 3.87–5.11)
RDW: 16 % — ABNORMAL HIGH (ref 11.5–15.5)
WBC: 7.7 10*3/uL (ref 4.0–10.5)

## 2024-01-25 LAB — BRAIN NATRIURETIC PEPTIDE: Pro B Natriuretic peptide (BNP): 387 pg/mL — ABNORMAL HIGH (ref 0.0–100.0)

## 2024-01-25 NOTE — Addendum Note (Signed)
Addended by: Roxy Manns A on: 01/25/2024 07:35 PM   Modules accepted: Orders

## 2024-01-26 NOTE — Patient Outreach (Signed)
  Care Coordination   Initial Visit Note   01/26/2024 Name: KERRILYNN DERENZO MRN: [id number] DOB: 1927/09/19  Lovett ONEIDA Apa is a 89 y.o. year old female who sees Tower, Laine LABOR, MD for primary care. I spoke with  Lovett ONEIDA Apa granddaughter by phone on 01/26/24.  What matters to the patients health and wellness today?  Options for facility care    Goals Addressed             This Visit's Progress    Options for potential facility care       Activities and task to complete in order to accomplish goals.   LEVELS OF CARE TASK Review facilities from the list provided call and schedule a visit for a tour when appropriate Consider referral for Palliative Care         SDOH assessments and interventions completed:  Yes  SDOH Interventions Today    Flowsheet Row Most Recent Value  SDOH Interventions   Food Insecurity Interventions Intervention Not Indicated  Housing Interventions Intervention Not Indicated  Transportation Interventions Intervention Not Indicated  Utilities Interventions Intervention Not Indicated        Care Coordination Interventions:  Yes, provided  Interventions Today    Flowsheet Row Most Recent Value  Chronic Disease   Chronic disease during today's visit Other  [dementia]  General Interventions   General Interventions Discussed/Reviewed General Interventions Discussed, Level of Care  [granddaughter confirms a decline in patient's condition-reports needing more care-discussed plan to reside in the home as long as possible but may need placement options if her condition progresses.]  Level of Care Personal Care Services, Skilled Nursing Facility  [Active with [facility]-granddaughter is patient's paid caregiver-granddaughter has aid that comes in in granddaughtrers absence-CSW to send faciilty list to daughter for review]  Education Interventions   Education Provided Provided Education  Provided Verbal Education On [employer] care  process, palliative care]  Safety Interventions   Safety Discussed/Reviewed Safety Discussed  [discussed safety concerns-opens door-comes up the stairs independently-]  Advanced Directive Interventions   Advanced Directives Discussed/Reviewed Advanced Directives Discussed, End of Life  End of Life Palliative       Follow up plan: Follow up call scheduled for 02/09/24    Encounter Outcome:  Patient Visit Completed

## 2024-01-26 NOTE — Patient Instructions (Signed)
 Visit Information  Thank you for taking time to visit with me today. Please don't hesitate to contact me if I can be of assistance to you.   Following are the goals we discussed today:   Goals Addressed             This Visit's Progress    Options for potential facility care       Activities and task to complete in order to accomplish goals.   LEVELS OF CARE TASK Review facilities from the list provided call and schedule a visit for a tour when appropriate Consider referral for Palliative Care         Our next appointment is by telephone on 02/09/24 at 3pm  Please call the care guide team at [phone] if you need to cancel or reschedule your appointment.   If you are experiencing a Mental Health or Behavioral Health Crisis or need someone to talk to, please call 911   Patient verbalizes understanding of instructions and care plan provided today and agrees to view in MyChart. Active MyChart status and patient understanding of how to access instructions and care plan via MyChart confirmed with patient.     Telephone follow up appointment with care management team member scheduled for: 02/09/24  Lenn Mean, LCSW [place]  [facility], [facility] Licensed Clinical Social Worker Care Coordinator  Direct Dial: [phone]

## 2024-01-27 ENCOUNTER — Telehealth: Payer: Self-pay | Admitting: *Deleted

## 2024-01-27 NOTE — Telephone Encounter (Signed)
 Family has already viewed pt's results via mychart but per Dr. Malissa Se pt needs a non fasting lab appt in 2 weeks to recheck bmp, please schedule thanks

## 2024-01-27 NOTE — Telephone Encounter (Signed)
 Spoke to pt's daughter, she stated she'd the pt's granddaughter to call back to schedule labs

## 2024-01-31 DIAGNOSIS — N183 Chronic kidney disease, stage 3 unspecified: Secondary | ICD-10-CM | POA: Diagnosis not present

## 2024-01-31 DIAGNOSIS — K219 Gastro-esophageal reflux disease without esophagitis: Secondary | ICD-10-CM | POA: Diagnosis not present

## 2024-02-07 ENCOUNTER — Ambulatory Visit
Admission: RE | Admit: 2024-02-07 | Discharge: 2024-02-07 | Disposition: A | Discharge status: Home / Self Care | Payer: 59 | Source: Ambulatory Visit | Attending: Family Medicine | Admitting: Family Medicine

## 2024-02-07 DIAGNOSIS — I10 Essential (primary) hypertension: Secondary | ICD-10-CM | POA: Insufficient documentation

## 2024-02-07 DIAGNOSIS — E785 Hyperlipidemia, unspecified: Secondary | ICD-10-CM | POA: Diagnosis not present

## 2024-02-07 DIAGNOSIS — I083 Combined rheumatic disorders of mitral, aortic and tricuspid valves: Secondary | ICD-10-CM | POA: Insufficient documentation

## 2024-02-07 DIAGNOSIS — R600 Localized edema: Secondary | ICD-10-CM | POA: Insufficient documentation

## 2024-02-07 DIAGNOSIS — R7989 Other specified abnormal findings of blood chemistry: Secondary | ICD-10-CM | POA: Insufficient documentation

## 2024-02-07 LAB — ECHOCARDIOGRAM COMPLETE
AR max vel: 2.42 cm2
AV Area VTI: 2.79 cm2
AV Area mean vel: 2.36 cm2
AV Mean grad: 2.5 mm[Hg]
AV Peak grad: 3.9 mm[Hg]
Ao pk vel: 0.99 m/s
Area-P 1/2: 3.46 cm2
S' Lateral: 2.7 cm

## 2024-02-07 NOTE — Progress Notes (Signed)
*  PRELIMINARY RESULTS* Echocardiogram 2D Echocardiogram has been performed.  Cristela Blue 02/07/2024, 10:30 AM

## 2024-02-08 ENCOUNTER — Encounter: Payer: Self-pay | Admitting: Family Medicine

## 2024-02-09 ENCOUNTER — Encounter: Payer: Self-pay | Admitting: *Deleted

## 2024-02-16 ENCOUNTER — Encounter: Payer: Self-pay | Admitting: Family Medicine

## 2024-02-16 ENCOUNTER — Other Ambulatory Visit: Payer: Self-pay | Admitting: Family Medicine

## 2024-02-16 ENCOUNTER — Ambulatory Visit (INDEPENDENT_AMBULATORY_CARE_PROVIDER_SITE_OTHER): Payer: 59 | Admitting: Family Medicine

## 2024-02-16 VITALS — BP 100/68 | HR 65 | Temp 97.8°F | Ht 60.0 in | Wt 114.4 lb

## 2024-02-16 DIAGNOSIS — E7800 Pure hypercholesterolemia, unspecified: Secondary | ICD-10-CM

## 2024-02-16 DIAGNOSIS — N1831 Chronic kidney disease, stage 3a: Secondary | ICD-10-CM | POA: Diagnosis not present

## 2024-02-16 DIAGNOSIS — R0609 Other forms of dyspnea: Secondary | ICD-10-CM

## 2024-02-16 DIAGNOSIS — F03B4 Unspecified dementia, moderate, with anxiety: Secondary | ICD-10-CM | POA: Diagnosis not present

## 2024-02-16 DIAGNOSIS — I2721 Secondary pulmonary arterial hypertension: Secondary | ICD-10-CM | POA: Insufficient documentation

## 2024-02-16 DIAGNOSIS — I10 Essential (primary) hypertension: Secondary | ICD-10-CM | POA: Diagnosis not present

## 2024-02-16 DIAGNOSIS — D509 Iron deficiency anemia, unspecified: Secondary | ICD-10-CM

## 2024-02-16 NOTE — Assessment & Plan Note (Signed)
 Much improved with lasix Also much improved pedal edema  Increase PA pressure on echo / not severe  Would rather not undergo aggressive cardiology work up at this time in light of age and dementia  Continues lasix 20  Lab today

## 2024-02-16 NOTE — Telephone Encounter (Signed)
 Pt has a f/u with PCP today, will route to PCP for review at appt

## 2024-02-16 NOTE — Patient Instructions (Addendum)
 You can hold rexulti for periods of time and decide whether pros outweigh cons   Glad that breathing and swelling are improved  If you want to see cardiology in the future let us know   Continue lasix  Labs today   Continue to work on regular meals and fluids

## 2024-02-16 NOTE — Assessment & Plan Note (Signed)
 bp in fair control at this time  BP Readings from Last 1 Encounters:  02/16/24 100/68   No changes needed / this is fairly stable but will watch for orthostatic hypotension  Most recent labs reviewed   Losartan 50 mg daily  Metoprolol xl 50 mg daily  Lasix 20 mg daily   Bmet pending

## 2024-02-16 NOTE — Assessment & Plan Note (Signed)
 Continues Namenda 5 mg bid  Donepazil 10 mg daily   Rexulti for agitation- is low dose 1 mg  Again reviewed pros/cons including possible increase in mortality with this med  Caregivers note she may be too sedate at times  Considering holding dose for a short time to see if this improves   Encouraged to update [place] with response  Mood is calm today

## 2024-02-16 NOTE — Assessment & Plan Note (Signed)
 Noted on echo  Had some CHF symptoms / much better on lasix   Wants to avoid aggressive card work up due to age and dementia  Will continue to monitor  Lab today

## 2024-02-16 NOTE — Assessment & Plan Note (Signed)
 Of chronic dz from CKD Lab today  No clinical changes

## 2024-02-16 NOTE — Assessment & Plan Note (Signed)
 Continues lasix 20 mg for edema and breathing  Last GFR had improved Family encouraged food and fluids regularly   Bmet today  Will add back K if needed

## 2024-02-16 NOTE — Progress Notes (Signed)
 Subjective:    Patient ID: Amanda Salas, female    DOB: April 19, 1927, 89 y.o.   MRN: [id number]  HPI  Wt Readings from Last 3 Encounters:  02/16/24 114 lb 6.4 oz (51.9 kg)  01/24/24 118 lb 8 oz (53.8 kg)  01/19/24 120 lb 6 oz (54.6 kg)   22.34 kg/m  Vitals:   02/16/24 1538  BP: 100/68  Pulse: 65  Temp: 97.8 F (36.6 C)  SpO2: 96%     Pt presents for follow up of pedal edema and chronic health problems   HTN bp is stable today  No cp or palpitations or headaches or edema  No side effects to medicines  BP Readings from Last 3 Encounters:  02/16/24 100/68  01/24/24 116/64  01/19/24 104/62   Losartan 50 mg daily  Metoprolol xl 50 mg daily  Lasix 20 mg daily   Light eater but most of the time will eat  Picky at times    Ckd Lab Results  Component Value Date   NA 138 01/24/2024   K 4.7 01/24/2024   CO2 26 01/24/2024   GLUCOSE 121 (H) 01/24/2024   BUN 27 (H) 01/24/2024   CREATININE 1.29 (H) 01/24/2024   CALCIUM 8.9 01/24/2024   GFR 35.62 (L) 01/24/2024   GFRNONAA 59 (L) 06/12/2020   Improved from prior but not up to baseline   Due for re check   Pedal edema BNP was elevated at 387 early feb  Was shortness of breath  This improved with lasix   Lasix is really helping ankle swelling Also breathing better     Had echo ECHOCARDIOGRAM COMPLETE Result Date: 02/07/2024    ECHOCARDIOGRAM REPORT   Patient Name:   Amanda Salas Date of Exam: 02/07/2024 Medical Rec #:  [id number]        Height:       60.0 in Accession #:    [id number]       Weight:       118.5 lb Date of Birth:  [birth year]       BSA:          1.494 m Patient Age:    89 years         BP:           116/64 mmHg Patient Gender: F                HR:           69 bpm. Exam Location:  [facility] Procedure: 2D Echo, Cardiac Doppler and Color Doppler (Both Spectral and Color            Flow Doppler were utilized during procedure). Indications:     Edema R60.0                  Elevated brain natriuretic  peptide R79.89                  Other abnormalities of the heart  History:         Patient has no prior history of Echocardiogram examinations.                  Risk Factors:Hypertension and Dyslipidemia.  Sonographer:     Cristela Blue Referring Phys:  [id number] AMY E BEDSOLE Diagnosing Phys: Lorine Bears MD IMPRESSIONS  1. Left ventricular ejection fraction, by estimation, is 55 to 60%. The left ventricle has normal function. The left ventricle has no regional wall motion abnormalities. There  is moderate left ventricular hypertrophy. Left ventricular diastolic parameters are indeterminate.  2. Right ventricular systolic function is normal. The right ventricular size is normal. There is mildly elevated pulmonary artery systolic pressure. The estimated right ventricular systolic pressure is 38.9 mmHg.  3. Left atrial size was mildly dilated.  4. The mitral valve is normal in structure. Moderate mitral valve regurgitation. No evidence of mitral stenosis.  5. Tricuspid valve regurgitation is moderate to severe.  6. The aortic valve is normal in structure. Aortic valve regurgitation is mild. Aortic valve sclerosis is present, with no evidence of aortic valve stenosis.  7. The inferior vena cava is normal in size with <50% respiratory variability, suggesting right atrial pressure of 8 mmHg. FINDINGS  Left Ventricle: Left ventricular ejection fraction, by estimation, is 55 to 60%. The left ventricle has normal function. The left ventricle has no regional wall motion abnormalities. Strain imaging was not performed. The left ventricular internal cavity  size was normal in size. There is moderate left ventricular hypertrophy. Left ventricular diastolic parameters are indeterminate. Right Ventricle: The right ventricular size is normal. No increase in right ventricular wall thickness. Right ventricular systolic function is normal. There is mildly elevated pulmonary artery systolic pressure. The tricuspid regurgitant velocity is  2.78  m/s, and with an assumed right atrial pressure of 8 mmHg, the estimated right ventricular systolic pressure is 38.9 mmHg. Left Atrium: Left atrial size was mildly dilated. Right Atrium: Right atrial size was normal in size. Pericardium: There is no evidence of pericardial effusion. Mitral Valve: The mitral valve is normal in structure. There is mild thickening of the mitral valve leaflet(s). Moderate mitral valve regurgitation. No evidence of mitral valve stenosis. Tricuspid Valve: The tricuspid valve is normal in structure. Tricuspid valve regurgitation is moderate to severe. No evidence of tricuspid stenosis. Aortic Valve: The aortic valve is normal in structure. Aortic valve regurgitation is mild. Aortic valve sclerosis is present, with no evidence of aortic valve stenosis. Aortic valve mean gradient measures 2.5 mmHg. Aortic valve peak gradient measures 3.9  mmHg. Aortic valve area, by VTI measures 2.79 cm. Pulmonic Valve: The pulmonic valve was normal in structure. Pulmonic valve regurgitation is mild. No evidence of pulmonic stenosis. Aorta: The aortic root is normal in size and structure. Venous: The inferior vena cava is normal in size with less than 50% respiratory variability, suggesting right atrial pressure of 8 mmHg. IAS/Shunts: No atrial level shunt detected by color flow Doppler. Additional Comments: 3D imaging was not performed.  LEFT VENTRICLE PLAX 2D LVIDd:         4.10 cm LVIDs:         2.70 cm LV PW:         1.40 cm LV IVS:        1.30 cm LVOT diam:     2.00 cm LV SV:         47 LV SV Index:   32 LVOT Area:     3.14 cm  RIGHT VENTRICLE RV Basal diam:  3.40 cm RV Mid diam:    3.00 cm LEFT ATRIUM           Index        RIGHT ATRIUM           Index LA diam:      4.50 cm 3.01 cm/m   RA Area:     11.90 cm LA Vol (A2C): 31.9 ml 21.35 ml/m  RA Volume:   25.40 ml  17.00 ml/m  LA Vol (A4C): 52.4 ml 35.07 ml/m  AORTIC VALVE AV Area (Vmax):    2.42 cm AV Area (Vmean):   2.36 cm AV Area (VTI):      2.79 cm AV Vmax:           99.25 cm/s AV Vmean:          69.550 cm/s AV VTI:            0.169 m AV Peak Grad:      3.9 mmHg AV Mean Grad:      2.5 mmHg LVOT Vmax:         76.60 cm/s LVOT Vmean:        52.200 cm/s LVOT VTI:          0.150 m LVOT/AV VTI ratio: 0.89  AORTA Ao Root diam: 2.40 cm MITRAL VALVE               TRICUSPID VALVE MV Area (PHT): 3.46 cm    TR Peak grad:   30.9 mmHg MV Decel Time: 219 msec    TR Vmax:        278.00 cm/s MV E velocity: 82.30 cm/s                            SHUNTS                            Systemic VTI:  0.15 m                            Systemic Diam: 2.00 cm Lorine Bears MD Electronically signed by Lorine Bears MD Signature Date/Time: 02/07/2024/12:34:56 PM    Final    DG Chest 2 View Result Date: 01/19/2024 CLINICAL DATA:  Shortness of breath. EXAM: CHEST - 2 VIEW COMPARISON:  June 09, 2020. FINDINGS: The heart size and mediastinal contours are within normal limits. Both lungs are clear. The visualized skeletal structures are unremarkable. IMPRESSION: No active cardiopulmonary disease. Electronically Signed   By: Lupita Raider M.D.   On: 01/19/2024 16:39   Mild elevation in pulmonary artery pressure  Reassuring ventricular function  Some regurg of mitral and tricuspid valves     Anemia Lab Results  Component Value Date   WBC 7.7 01/24/2024   HGB 10.3 (L) 01/24/2024   HCT 31.2 (L) 01/24/2024   MCV 95.0 01/24/2024   PLT 182.0 01/24/2024   Stable Likely due to renal function /chronic disease    Continues rexulti for dementia along with namenda and donepezil   Mobility is worse  A lot of brain fog  Wants to go to bed in clothes  Confused easily  Her decline is picking up unfortunately      Patient Active Problem List   Diagnosis Date Noted   High pulmonary arterial pressure (HCC) 02/16/2024   Dyspnea on exertion 01/19/2024   Peripheral edema 01/13/2024   Asymptomatic varicose veins of both lower extremities 01/13/2024   Mobility  impaired 09/01/2023   Estrogen deficiency 09/23/2022   H/O fracture of femur 06/09/2020   Stage 3a chronic kidney disease (HCC) 06/09/2020   Elevated glucose 06/09/2020   Medicare annual wellness visit, subsequent 08/30/2018   Routine general medical examination at a health care facility 08/30/2018   Advance care planning 08/30/2018   H/O fracture of hip 10/30/2017   Dystrophic nail 04/23/2015  Anemia 04/17/2014   History of vertebral compression fracture 12/26/2013   Low back pain 11/22/2013   Mycotic toenails 11/04/2011   Dementia (HCC) 12/29/2010   Vitamin D deficiency 07/01/2010   FALL, HX OF 07/01/2010   Hyperlipidemia 08/10/2008   Essential hypertension 08/10/2008   ALLERGIC RHINITIS 08/10/2008   GERD 08/10/2008   Osteoporosis 08/10/2008   Past Medical History:  Diagnosis Date   Allergy    Arthritis    Cataract    Dementia (HCC)    Disorder of bone and cartilage, unspecified    GERD (gastroesophageal reflux disease)    Hepatitis    Hyperlipidemia    Hypertension    Thrombocytopenia, unspecified (HCC)    Past Surgical History:  Procedure Laterality Date   CARDIAC CATHETERIZATION     ESOPHAGOGASTRODUODENOSCOPY     INTRAMEDULLARY (IM) NAIL INTERTROCHANTERIC Left 06/09/2020   INTRAMEDULLARY (IM) NAIL INTERTROCHANTERIC Left 06/09/2020   Procedure: INTRAMEDULLARY (IM) NAIL INTERTROCHANTRIC;  Surgeon: Tarry Kos, MD;  Location: [facility] OR;  Service: Orthopedics;  Laterality: Left;   TOTAL HIP ARTHROPLASTY Right 10/31/2017   Procedure: TOTAL HIP ARTHROPLASTY DIRECT ANTERIOR APPROACH;  Surgeon: Tarry Kos, MD;  Location: [facility] OR;  Service: Orthopedics;  Laterality: Right;   Social History   Tobacco Use   Smoking status: Never   Smokeless tobacco: Never  Vaping Use   Vaping status: Never Used  Substance Use Topics   Alcohol use: No    Alcohol/week: 0.0 standard drinks of alcohol   Drug use: No   Family History  Problem Relation Age of Onset   Alzheimer's disease  Mother    Allergies  Allergen Reactions   Ace Inhibitors     REACTION: cough   Current Outpatient Medications on File Prior to Visit  Medication Sig Dispense Refill   acetaminophen (TYLENOL) 325 MG tablet Take 2 tablets (650 mg total) by mouth every 6 (six) hours as needed for mild pain (pain score 1-3 or temp > 100.5). (Patient taking differently: Take 650 mg by mouth every 6 (six) hours as needed for mild pain (pain score 1-3). Also takes 2 tablets at bedtime) 30 tablet 0   alum & mag hydroxide-simeth (MAALOX/MYLANTA) 200-200-20 MG/5ML suspension Take 30 mLs by mouth every 4 (four) hours as needed for indigestion. 355 mL 0   brexpiprazole (REXULTI) 1 MG TABS tablet Take 1 tablet daily 30 tablet 3   Calcium Carbonate-Vitamin D (CALCIUM-D PO) Take 1 tablet by mouth daily.     Cholecalciferol (VITAMIN D3 PO) Take 1,000 Units daily by mouth.      docusate sodium (COLACE) 100 MG capsule Take 1 capsule (100 mg total) by mouth 2 (two) times daily. 10 capsule 0   furosemide (LASIX) 20 MG tablet 1 tablet daily along with potassium x 3 days 30 tablet 1   losartan (COZAAR) 50 MG tablet TAKE ONE TABLET (50 MG TOTAL) BY MOUTH DAILY. 90 tablet 2   melatonin 3 MG TABS tablet Take 1 tablet (3 mg total) by mouth at bedtime as needed (sleep). 20 tablet 0   memantine (NAMENDA) 5 MG tablet TAKE ONE TABLET BY MOUTH TWICE A DAY 180 tablet 1   metoprolol succinate (TOPROL-XL) 50 MG 24 hr tablet TAKE 1 TABLET BY MOUTH ONCE A DAY WITH OR IMMEDIATELY FOLLOWING A MEAL 90 tablet 1   simvastatin (ZOCOR) 20 MG tablet      No current facility-administered medications on file prior to visit.    Review of Systems  Constitutional:  Positive for  activity change. Negative for appetite change, fatigue, fever and unexpected weight change.  HENT:  Negative for congestion, ear pain, rhinorrhea, sinus pressure and sore throat.   Eyes:  Negative for pain, redness and visual disturbance.  Respiratory:  Negative for cough,  shortness of breath and wheezing.   Cardiovascular:  Negative for chest pain and palpitations.  Gastrointestinal:  Negative for abdominal pain, blood in stool, constipation and diarrhea.  Endocrine: Negative for polydipsia and polyuria.  Genitourinary:  Negative for dysuria, frequency and urgency.  Musculoskeletal:  Negative for arthralgias, back pain and myalgias.  Skin:  Negative for pallor and rash.  Allergic/Immunologic: Negative for environmental allergies.  Neurological:  Negative for dizziness, syncope and headaches.  Hematological:  Negative for adenopathy. Does not bruise/bleed easily.  Psychiatric/Behavioral:  Positive for confusion and decreased concentration. Negative for agitation, behavioral problems and dysphoric mood. The patient is not nervous/anxious.        Worsening memory  More sedate lately  Occational refuses to eat        Objective:   Physical Exam Constitutional:      General: She is not in acute distress.    Appearance: Normal appearance. She is well-developed and normal weight. She is not ill-appearing or diaphoretic.  HENT:     Head: Normocephalic and atraumatic.     Mouth/Throat:     Mouth: Mucous membranes are moist.  Eyes:     Conjunctiva/sclera: Conjunctivae normal.     Pupils: Pupils are equal, round, and reactive to light.  Neck:     Thyroid: No thyromegaly.     Vascular: No carotid bruit or JVD.  Cardiovascular:     Rate and Rhythm: Normal rate and regular rhythm.     Heart sounds: Normal heart sounds.     No gallop.  Pulmonary:     Effort: Pulmonary effort is normal. No respiratory distress.     Breath sounds: Normal breath sounds. No stridor. No wheezing, rhonchi or rales.     Comments: No crackles  Abdominal:     General: There is no distension or abdominal bruit.     Palpations: Abdomen is soft.  Musculoskeletal:     Cervical back: Normal range of motion and neck supple.     Right lower leg: No edema.     Left lower leg: No edema.   Lymphadenopathy:     Cervical: No cervical adenopathy.  Skin:    General: Skin is warm and dry.     Coloration: Skin is not pale.     Findings: No rash.  Neurological:     Mental Status: She is alert.     Coordination: Coordination normal.     Deep Tendon Reflexes: Reflexes are normal and symmetric. Reflexes normal.  Psychiatric:        Attention and Perception: She is inattentive.        Mood and Affect: Mood normal. Mood is not anxious or depressed.        Speech: Speech is tangential.        Cognition and Memory: Cognition is impaired. Memory is impaired.     Comments: Calm Repeats herself            Assessment & Plan:   Problem List Items Addressed This Visit       Cardiovascular and Mediastinum   Essential hypertension - Primary   bp in fair control at this time  BP Readings from Last 1 Encounters:  02/16/24 100/68   No changes needed /  this is fairly stable but will watch for orthostatic hypotension  Most recent labs reviewed   Losartan 50 mg daily  Metoprolol xl 50 mg daily  Lasix 20 mg daily   Bmet pending        Relevant Medications   simvastatin (ZOCOR) 20 MG tablet   Other Relevant Orders   Basic metabolic panel   CBC with Differential/Platelet     Nervous and Auditory   Dementia (HCC)   Continues Namenda 5 mg bid  Donepazil 10 mg daily   Rexulti for agitation- is low dose 1 mg  Again reviewed pros/cons including possible increase in mortality with this med  Caregivers note she may be too sedate at times  Considering holding dose for a short time to see if this improves   Encouraged to update [place] with response  Mood is calm today         Genitourinary   Stage 3a chronic kidney disease (HCC) (Chronic)   Continues lasix 20 mg for edema and breathing  Last GFR had improved Family encouraged food and fluids regularly   Bmet today  Will add back K if needed       Relevant Orders   Basic metabolic panel     Other   Hyperlipidemia    Relevant Medications   simvastatin (ZOCOR) 20 MG tablet   High pulmonary arterial pressure (HCC)   Noted on echo  Had some CHF symptoms / much better on lasix   Wants to avoid aggressive card work up due to age and dementia  Will continue to monitor  Lab today      Dyspnea on exertion   Much improved with lasix Also much improved pedal edema  Increase PA pressure on echo / not severe  Would rather not undergo aggressive cardiology work up at this time in light of age and dementia  Continues lasix 20  Lab today      Anemia   Of chronic dz from CKD Lab today  No clinical changes       Relevant Orders   CBC with Differential/Platelet

## 2024-02-17 ENCOUNTER — Encounter: Payer: Self-pay | Admitting: Family Medicine

## 2024-02-17 LAB — BASIC METABOLIC PANEL
BUN: 36 mg/dL — ABNORMAL HIGH (ref 6–23)
CO2: 30 meq/L (ref 19–32)
Calcium: 8.8 mg/dL (ref 8.4–10.5)
Chloride: 100 meq/L (ref 96–112)
Creatinine, Ser: 1.26 mg/dL — ABNORMAL HIGH (ref 0.40–1.20)
GFR: 36.62 mL/min — ABNORMAL LOW (ref >60.00)
Glucose, Bld: 99 mg/dL (ref 70–99)
Potassium: 4.6 meq/L (ref 3.5–5.1)
Sodium: 139 meq/L (ref 135–145)

## 2024-02-17 LAB — CBC WITH DIFFERENTIAL/PLATELET
Basophils Absolute: 0 10*3/uL (ref 0.0–0.1)
Basophils Relative: 0.6 % (ref 0.0–3.0)
Eosinophils Absolute: 0.6 10*3/uL (ref 0.0–0.7)
Eosinophils Relative: 7.2 % — ABNORMAL HIGH (ref 0.0–5.0)
HCT: 32.6 % — ABNORMAL LOW (ref 36.0–46.0)
Hemoglobin: 10.7 g/dL — ABNORMAL LOW (ref 12.0–15.0)
Lymphocytes Relative: 27.8 % (ref 12.0–46.0)
Lymphs Abs: 2.2 10*3/uL (ref 0.7–4.0)
MCHC: 33 g/dL (ref 30.0–36.0)
MCV: 93.2 fl (ref 78.0–100.0)
Monocytes Absolute: 0.7 10*3/uL (ref 0.1–1.0)
Monocytes Relative: 8.9 % (ref 3.0–12.0)
Neutro Abs: 4.3 10*3/uL (ref 1.4–7.7)
Neutrophils Relative %: 55.5 % (ref 43.0–77.0)
Platelets: 171 10*3/uL (ref 150.0–400.0)
RBC: 3.5 Mil/uL — ABNORMAL LOW (ref 3.87–5.11)
RDW: 15.8 % — ABNORMAL HIGH (ref 11.5–15.5)
WBC: 7.7 10*3/uL (ref 4.0–10.5)

## 2024-02-17 NOTE — Addendum Note (Signed)
 Addended by: Roxy Manns A on: 02/17/2024 02:43 PM   Modules accepted: Orders

## 2024-02-21 DIAGNOSIS — K219 Gastro-esophageal reflux disease without esophagitis: Secondary | ICD-10-CM | POA: Diagnosis not present

## 2024-02-21 DIAGNOSIS — N183 Chronic kidney disease, stage 3 unspecified: Secondary | ICD-10-CM | POA: Diagnosis not present

## 2024-03-07 ENCOUNTER — Ambulatory Visit: Payer: Self-pay | Admitting: *Deleted

## 2024-03-07 NOTE — Patient Outreach (Signed)
 Care Coordination   Follow Up Visit Note   03/07/2024 Name: ANGELLI BARUCH MRN: [id number] DOB: Jul 23, 1927  [name] is a 89 y.o. year old female who sees [facility], Audrie Gallus, MD for primary care. I spoke with  Jeannett Senior grandaughter by phone today.  What matters to the patients health and wellness today?  Long-term placement in memory care    Goals Addressed             This Visit's Progress    Options for potential facility care       Activities and task to complete in order to accomplish goals.   LEVELS OF CARE TASK Continue to review facilities from the list provided call and schedule a visit for a tour when appropriate CSW will request FL2 from providers office with will fax out to local facilities including [address], [address], [facility] and [facility]         SDOH assessments and interventions completed:  No     Care Coordination Interventions:  Yes, provided  Interventions Today    Flowsheet Row Most Recent Value  Chronic Disease   Chronic disease during today's visit Other  [dementia]  General Interventions   General Interventions Discussed/Reviewed General Interventions Reviewed, [facility], Level of Care  [starting to see a slow decline, wandering in doors, increased agitation, short tempered, loss of ability to complete ADL's  granddaughter requesting assistance with placement in long term care]  Level of Care Skilled Nursing Facility, Programmer, applications agreeable to possible placement at [facility], [facility], [facility], [facility] or [facility]]  Applications FL-2  [Fl2 to be requested by provider for placement purposes]  Education Interventions   Education Provided Provided Education  Provided Verbal Education On Other, Biomedical scientist and long term care eligibilty process]  Applications FL-2  [Fl2 to be requested by provider for placement purposes]       Follow up plan:  Follow up call scheduled for 03/21/24    Encounter Outcome:  Patient Visit Completed

## 2024-03-07 NOTE — Patient Instructions (Signed)
 Visit Information  Thank you for taking time to visit with me today. Please don't hesitate to contact me if I can be of assistance to you.   Following are the goals we discussed today:   Goals Addressed             This Visit's Progress    Options for potential facility care       Activities and task to complete in order to accomplish goals.   LEVELS OF CARE TASK Continue to review facilities from the list provided call and schedule a visit for a tour when appropriate CSW will request FL2 from providers office with will fax out to local facilities including [address], [address], [facility] [place] and [place]         Our next appointment is by telephone on 03/21/24 at 1pm  Please call the care guide team at [phone] if you need to cancel or reschedule your appointment.   If you are experiencing a Mental Health or Behavioral Health Crisis or need someone to talk to, please call 911   Patient verbalizes understanding of instructions and care plan provided today and agrees to view in MyChart. Active MyChart status and patient understanding of how to access instructions and care plan via MyChart confirmed with patient.     Telephone follow up appointment with care management team member scheduled for: 03/21/24  Verna Czech, LCSW [place]  [facility], [facility] Licensed Clinical Social Worker Care Coordinator  Direct Dial: [phone]

## 2024-03-09 ENCOUNTER — Encounter: Payer: Self-pay | Admitting: *Deleted

## 2024-03-09 ENCOUNTER — Telehealth: Payer: Self-pay | Admitting: *Deleted

## 2024-03-09 NOTE — Telephone Encounter (Signed)
 [employer] (Child psychotherapist) faxed over an [employer] on pt for PCP to complete. PCP filled out form but not sure where Crystal wants [place] to fax it back to. Left VM request Crystal to give [place] a fax # to send form back to

## 2024-03-10 ENCOUNTER — Telehealth: Payer: Self-pay | Admitting: Family Medicine

## 2024-03-10 NOTE — Telephone Encounter (Signed)
 Crystal called and provided fax #, FL2 faxed

## 2024-03-10 NOTE — Telephone Encounter (Signed)
 Form faxed

## 2024-03-10 NOTE — Telephone Encounter (Signed)
 Copied from CRM [id number]. Topic: General - Other >> Mar 10, 2024 10:47 AM Fonda Kinder J wrote: Reason for CRM: Social worker [employer] called in to give fax number to [employer] [phone]

## 2024-03-21 ENCOUNTER — Encounter: Payer: Self-pay | Admitting: *Deleted

## 2024-03-21 ENCOUNTER — Telehealth: Payer: Self-pay | Admitting: *Deleted

## 2024-03-21 DIAGNOSIS — N183 Chronic kidney disease, stage 3 unspecified: Secondary | ICD-10-CM | POA: Diagnosis not present

## 2024-03-21 DIAGNOSIS — K219 Gastro-esophageal reflux disease without esophagitis: Secondary | ICD-10-CM | POA: Diagnosis not present

## 2024-03-21 NOTE — Patient Outreach (Signed)
 Care Coordination   Collaboration  Visit Note   03/21/2024 Name: DESAREA OHAGAN MRN: [id number] DOB: 1927/08/26  [name] is a 89 y.o. year old female who sees [facility], Audrie Gallus, MD for primary care. I  spoke with memory care facilities of patient's family's choice to follow up on bed availabilities.   What matters to the patients health and wellness today?   Placement in memory care     Goals Addressed             This Visit's Progress    Options for potential facility care       Activities and task to complete in order to accomplish goals.   LEVELS OF CARE TASK Continue to review facilities from the list provided call and schedule a visit for a tour when appropriate  FL2 received from providers office to provide to preferred facilities once bed becomes available        SDOH assessments and interventions completed:  No     Care Coordination Interventions:  Yes, provided  Interventions Today    Flowsheet Row Most Recent Value  Chronic Disease   Chronic disease during today's visit Other  [dementia]  General Interventions   General Interventions Discussed/Reviewed Communication with  Level of Care Skilled Nursing Facility  [[facility]-LVM, [facility]-confirmed no Medicaid beds available, [facility]-no beds available, the [facility] has availalble beds-Fl2 to be faxed to [phone] once granddaughter provides consent]       Follow up plan: Follow up call scheduled for patient's granddaughter cancelled today's follow up due to death in the family. Patient's granddaughter to call and re-schedule when available    Encounter Outcome:  Patient Visit Completed

## 2024-03-21 NOTE — Progress Notes (Signed)
 Complex Care Management Care Guide Note  03/21/2024 Name: Amanda Salas MRN: [id number] DOB: [birth year]  Amanda Salas is a 89 y.o. year old female who is a primary care patient of [facility], Audrie Gallus, MD and is actively engaged with the care management team. I reached out to Amanda Salas by phone today to assist with re-scheduling  with the Licensed Clinical Child psychotherapist.  Follow up plan: The care guide will reach out to the patient again over the next 14 days.  Gwenevere Ghazi  [facility]  [facility], [facility] Guide  Direct Dial: [phone]  Fax [phone]

## 2024-03-23 ENCOUNTER — Ambulatory Visit

## 2024-03-23 VITALS — BP 100/68 | Ht 60.0 in | Wt 114.0 lb

## 2024-03-23 DIAGNOSIS — Z0000 Encounter for general adult medical examination without abnormal findings: Secondary | ICD-10-CM | POA: Diagnosis not present

## 2024-03-23 NOTE — Progress Notes (Signed)
 Because this visit was a virtual/telehealth visit,  certain criteria was not obtained, such a blood pressure, CBG if applicable, and timed get up and go. Any medications not marked as "taking" were not mentioned during the medication reconciliation part of the visit. Any vitals not documented were not able to be obtained due to this being a telehealth visit or patient was unable to self-report a recent blood pressure reading due to a lack of equipment at home via telehealth. Vitals that have been documented are verbally provided by the patient.   Subjective:   Amanda Salas is a 89 y.o. who presents for a Medicare Wellness preventive visit.  Visit Complete: Virtual I connected with  Amanda Salas on 03/23/24 by a video and audio enabled telemedicine application and verified that I am speaking with the correct person using two identifiers.  Patient Location: Home  Provider Location: Home Office  I discussed the limitations of evaluation and management by telemedicine. The patient expressed understanding and agreed to proceed.  Vital Signs: Because this visit was a virtual/telehealth visit, some criteria may be missing or patient reported. Any vitals not documented were not able to be obtained and vitals that have been documented are patient reported.   Persons Participating in Visit: Patient assisted by Her Granddaughter.  AWV Questionnaire: No: Patient Medicare AWV questionnaire was not completed prior to this visit.  Cardiac Risk Factors include: advanced age (>61men, >18 women);sedentary lifestyle;dyslipidemia;hypertension     Objective:    Today's Vitals   03/23/24 1133  BP: 100/68  Weight: 114 lb (51.7 kg)  Height: 5' (1.524 m)   Body mass index is 22.26 kg/m.     03/23/2024   11:32 AM 05/22/2022    1:31 PM 06/09/2020   11:05 AM 06/09/2020    6:31 AM 11/04/2017   10:56 AM 08/28/2016   11:25 AM  Advanced Directives  Does Patient Have a Medical Advance Directive? No Yes  Yes No No Yes  Type of Advance Directive  Healthcare Power of Attorney Out of facility DNR (pink MOST or yellow form);Healthcare Power of [place];Living will   Healthcare Power of [place];Living will  Does patient want to make changes to medical advance directive?   No - Guardian declined   No - Patient declined  Copy of Healthcare Power of Attorney in Chart?  No - copy requested Yes - validated most recent copy scanned in chart (See row information)   No - copy requested  Would patient like information on creating a medical advance directive? No - Patient declined  No - Guardian declined No - Patient declined No - Patient declined     Current Medications (verified) Outpatient Encounter Medications as of 03/23/2024  Medication Sig   acetaminophen (TYLENOL) 325 MG tablet Take 2 tablets (650 mg total) by mouth every 6 (six) hours as needed for mild pain (pain score 1-3 or temp > 100.5). (Patient taking differently: Take 650 mg by mouth every 6 (six) hours as needed for mild pain (pain score 1-3). Also takes 2 tablets at bedtime)   alum & mag hydroxide-simeth (MAALOX/MYLANTA) 200-200-20 MG/5ML suspension Take 30 mLs by mouth every 4 (four) hours as needed for indigestion.   brexpiprazole (REXULTI) 1 MG TABS tablet Take 1 tablet daily   Calcium Carbonate-Vitamin D (CALCIUM-D PO) Take 1 tablet by mouth daily.   Cholecalciferol (VITAMIN D3 PO) Take 1,000 Units daily by mouth.    docusate sodium (COLACE) 100 MG capsule Take 1 capsule (100 mg total) by  mouth 2 (two) times daily.   donepezil (ARICEPT) 10 MG tablet TAKE ONE TABLET BY MOUTH EVERY NIGHT AT BEDTIME   furosemide (LASIX) 20 MG tablet 1 tablet daily along with potassium x 3 days   losartan (COZAAR) 50 MG tablet TAKE ONE TABLET (50 MG TOTAL) BY MOUTH DAILY.   melatonin 3 MG TABS tablet Take 1 tablet (3 mg total) by mouth at bedtime as needed (sleep).   memantine (NAMENDA) 5 MG tablet TAKE ONE TABLET BY MOUTH TWICE A DAY   metoprolol succinate  (TOPROL-XL) 50 MG 24 hr tablet TAKE 1 TABLET BY MOUTH ONCE A DAY WITH OR IMMEDIATELY FOLLOWING A MEAL   simvastatin (ZOCOR) 20 MG tablet    No facility-administered encounter medications on file as of 03/23/2024.    Allergies (verified) Ace inhibitors   History: Past Medical History:  Diagnosis Date   Allergy    Arthritis    Cataract    Dementia (HCC)    Disorder of bone and cartilage, unspecified    GERD (gastroesophageal reflux disease)    Hepatitis    Hyperlipidemia    Hypertension    Thrombocytopenia, unspecified (HCC)    Past Surgical History:  Procedure Laterality Date   CARDIAC CATHETERIZATION     ESOPHAGOGASTRODUODENOSCOPY     INTRAMEDULLARY (IM) NAIL INTERTROCHANTERIC Left 06/09/2020   INTRAMEDULLARY (IM) NAIL INTERTROCHANTERIC Left 06/09/2020   Procedure: INTRAMEDULLARY (IM) NAIL INTERTROCHANTRIC;  Surgeon: Tarry Kos, MD;  Location: [facility] OR;  Service: Orthopedics;  Laterality: Left;   TOTAL HIP ARTHROPLASTY Right 10/31/2017   Procedure: TOTAL HIP ARTHROPLASTY DIRECT ANTERIOR APPROACH;  Surgeon: Tarry Kos, MD;  Location: [facility] OR;  Service: Orthopedics;  Laterality: Right;   Family History  Problem Relation Age of Onset   Alzheimer's disease Mother    Social History   Socioeconomic History   Marital status: Divorced    Spouse name: Not on file   Number of children: 1   Years of education: Not on file   Highest education level: Not on file  Occupational History   Occupation: retired  Tobacco Use   Smoking status: Never   Smokeless tobacco: Never  Vaping Use   Vaping status: Never Used  Substance and Sexual Activity   Alcohol use: No    Alcohol/week: 0.0 standard drinks of alcohol   Drug use: No   Sexual activity: Not Currently  Other Topics Concern   Not on file  Social History Narrative   does word puzzles to keep memory sharp    enjoys reading   is active but no longer drives    Social Drivers of Corporate investment banker Strain: Low Risk   (03/23/2024)   Overall Financial Resource Strain (CARDIA)    Difficulty of Paying Living Expenses: Not hard at all  Food Insecurity: No Food Insecurity (03/23/2024)   Hunger Vital Sign    Worried About Running Out of Food in the Last Year: Never true    Ran Out of Food in the Last Year: Never true  Transportation Needs: No Transportation Needs (03/23/2024)   PRAPARE - Administrator, Civil Service (Medical): No    Lack of Transportation (Non-Medical): No  Physical Activity: Insufficiently Active (03/23/2024)   Exercise Vital Sign    Days of Exercise per Week: 3 days    Minutes of Exercise per Session: 10 min  Stress: No Stress Concern Present (03/23/2024)   [employer] of Occupational Health - Occupational Stress Questionnaire    Feeling  of Stress : Not at all  Social Connections: Socially Isolated (03/23/2024)   Social Connection and Isolation Panel [NHANES]    Frequency of Communication with Friends and Family: More than three times a week    Frequency of Social Gatherings with Friends and Family: More than three times a week    Attends Religious Services: Never    Database administrator or Organizations: No    Attends Banker Meetings: Never    Marital Status: Widowed    Tobacco Counseling Counseling given: Not Answered    Clinical Intake:  Pre-visit preparation completed: Yes        BMI - recorded: 22.26 Nutritional Status: BMI of 19-24  Normal Nutritional Risks: None Diabetes: No  Lab Results  Component Value Date   HGBA1C 5.7 09/23/2022        Interpreter Needed?: No  Information entered by :: [employer]   Activities of Daily Living     03/23/2024   11:36 AM  In your present state of health, do you have any difficulty performing the following activities:  Hearing? 0  Vision? 0  Difficulty concentrating or making decisions? 1  Walking or climbing stairs? 1  Dressing or bathing? 1  Doing errands, shopping? 1  Preparing  Food and eating ? Y  Using the Toilet? Y  In the past six months, have you accidently leaked urine? Y  Do you have problems with loss of bowel control? Y  Managing your Medications? Y  Managing your Finances? Y  Housekeeping or managing your Housekeeping? Y    Patient Care Team: Tower, Audrie Gallus, MD as PCP - [facility], [place], [place] as VBCI Care Management  Indicate any recent Medical Services you may have received from other than [facility] providers in the past year (date may be approximate).     Assessment:   This is a routine wellness examination for [place].  Hearing/Vision screen Hearing Screening - Comments:: No hearing issues  Vision Screening - Comments:: Patient has glasses   Goals Addressed               This Visit's Progress     Patient Stated (pt-stated)        To have no falls        Depression Screen     03/23/2024   11:38 AM 01/13/2024   11:28 AM 05/22/2022    1:32 PM 09/02/2021    2:46 PM 09/19/2019    6:18 PM 08/30/2018    2:21 PM 08/28/2016   11:32 AM  PHQ 2/9 Scores  PHQ - 2 Score 0  0 0 0 0 0  PHQ- 9 Score 0        Exception Documentation  Medical reason         Fall Risk     03/23/2024   11:35 AM 02/16/2024    3:41 PM 05/22/2022    1:32 PM 08/30/2018    2:21 PM 08/28/2016   11:32 AM  Fall Risk   Falls in the past year? 0 0 0 No No  Number falls in past yr: 0 0 0    Injury with Fall? 0 0 0    Risk for fall due to : Impaired mobility;Impaired balance/gait Other (Comment);Impaired mobility;Impaired balance/gait Impaired balance/gait;Impaired mobility;Medication side effect    Follow up Falls evaluation completed Falls evaluation completed Falls evaluation completed;Education provided;Falls prevention discussed      MEDICARE RISK AT HOME:  Medicare Risk at Home Any stairs  in or around the home?: Yes If so, are there any without handrails?: No Home free of loose throw rugs in walkways, pet beds, electrical cords, etc?: Yes Adequate lighting in  your home to reduce risk of falls?: Yes Life alert?: No Use of a cane, walker or w/c?: Yes (walker) Grab bars in the bathroom?: Yes Shower chair or bench in shower?: Yes Elevated toilet seat or a handicapped toilet?: No  TIMED UP AND GO:  Was the test performed?  No  Cognitive Function: Impaired: Cognitive status assessed by direct observation. Patient has current diagnosis of cognitive impairment.    08/28/2016   11:50 AM  MMSE - Mini Mental State Exam  Orientation to time 0  Orientation to time comments pt was unable to provide month or year  Orientation to Place 5  Registration 3  Attention/ Calculation 0  Recall 0  Recall-comments pt was unable to recall 3 of 3 words  Language- name 2 objects 0  Language- repeat 1  Language- follow 3 step command 2  Language- follow 3 step command-comments pt was unable to follow 1 step of 3 step command  Language- read & follow direction 0  Write a sentence 0  Copy design 0  Total score 11        03/23/2024   11:35 AM  6CIT Screen  What Year? 4 points  What month? 3 points  What time? 3 points  Count back from 20 4 points  Months in reverse 4 points  Repeat phrase 10 points  Total Score 28 points    Immunizations Immunization History  Administered Date(s) Administered   Fluad Quad(high Dose 65+) 10/26/2019, 09/23/2022   Fluad Trivalent(High Dose 65+) 10/12/2023   Influenza Split 11/04/2011, 03/03/2013   Influenza Whole 10/04/2007, 12/06/2008, 10/21/2009, 10/21/2010   Influenza,inj,Quad PF,6+ Mos 11/22/2013, 10/30/2014, 08/28/2016, 08/30/2018   PFIZER(Purple Top)SARS-COV-2 Vaccination 01/24/2020, 02/14/2020   Pneumococcal Conjugate-13 06/01/2014   Pneumococcal Polysaccharide-23 11/20/2005   Td 09/19/1998, 12/11/2009    Screening Tests Health Maintenance  Topic Date Due   DTaP/Tdap/Td (3 - Tdap) 10/11/2024 (Originally 12/12/2019)   COVID-19 Vaccine (3 - 2024-25 season) 10/27/2024 (Originally 08/22/2023)   Zoster Vaccines-  Shingrix (1 of 2) 04/22/2025 (Originally 12/01/1979)   INFLUENZA VACCINE  07/21/2024   Medicare Annual Wellness (AWV)  03/23/2025   Pneumonia Vaccine 42+ Years old  Completed   DEXA SCAN  Completed   HPV VACCINES  Aged Out    Health Maintenance  There are no preventive care reminders to display for this patient. Health Maintenance Items Addressed:   Additional Screening:  Vision Screening: Recommended annual ophthalmology exams for early detection of glaucoma and other disorders of the eye.  Dental Screening: Recommended annual dental exams for proper oral hygiene  Community Resource Referral / Chronic Care Management: CRR required this visit?  No   CCM required this visit?  No     Plan:     I have personally reviewed and noted the following in the patient's chart:   Medical and social history Use of alcohol, tobacco or illicit drugs  Current medications and supplements including opioid prescriptions. Patient is not currently taking opioid prescriptions. Functional ability and status Nutritional status Physical activity Advanced directives List of other physicians Hospitalizations, surgeries, and ER visits in previous 12 months Vitals Screenings to include cognitive, depression, and falls Referrals and appointments  In addition, I have reviewed and discussed with patient certain preventive protocols, quality metrics, and best practice recommendations. A written personalized care plan  for preventive services as well as general preventive health recommendations were provided to patient.     Rudi Heap, [place]   03/23/2024   After Visit Summary: (MyChart) Due to this being a telephonic visit, the after visit summary with patients personalized plan was offered to patient via MyChart   Notes: Nothing significant to report at this time.

## 2024-03-23 NOTE — Patient Instructions (Signed)
 Amanda Salas , Thank you for taking time to come for your Medicare Wellness Visit. I appreciate your ongoing commitment to your health goals. Please review the following plan we discussed and let me know if I can assist you in the future.   Referrals/Orders/Follow-Ups/Clinician Recommendations: follow up in 1 year.  This is a list of the screening recommended for you and due dates:  Health Maintenance  Topic Date Due   DTaP/Tdap/Td vaccine (3 - Tdap) 10/11/2024*   COVID-19 Vaccine (3 - 2024-25 season) 10/27/2024*   Zoster (Shingles) Vaccine (1 of 2) 04/22/2025*   Flu Shot  07/21/2024   Medicare Annual Wellness Visit  03/23/2025   Pneumonia Vaccine  Completed   DEXA scan (bone density measurement)  Completed   HPV Vaccine  Aged Out  *Topic was postponed. The date shown is not the original due date.    Advanced directives: (Copy Requested) Please bring a copy of your health care power of attorney and living will to the office to be added to your chart at your convenience. You can mail to [facility] [address] [place], [place] [address] or email to [email or web address]  Next Medicare Annual Wellness Visit scheduled for next year: Yes

## 2024-04-17 ENCOUNTER — Other Ambulatory Visit: Payer: Self-pay | Admitting: *Deleted

## 2024-04-18 NOTE — Patient Outreach (Addendum)
 Complex Care Management   Visit Note  04/18/2024 late entry  Name:  Amanda Salas MRN: [id number] DOB: 22-Nov-1927  Situation: Referral received for Complex Care Management related to Dementia I obtained verbal consent from Caregiver.  Visit completed with caregiver on 04/17/24 to discuss placement options in memory care.   on the phone  Background:   Past Medical History:  Diagnosis Date   Allergy    Arthritis    Cataract    Dementia (HCC)    Disorder of bone and cartilage, unspecified    GERD (gastroesophageal reflux disease)    Hepatitis    Hyperlipidemia    Hypertension    Thrombocytopenia, unspecified (HCC)     Assessment: Patient Reported Symptoms:  Cognitive Cognitive Status: Able to follow simple commands, Poor judgment in daily scenarios, Struggling with memory recall      Neurological Neurological Review of Symptoms: No symptoms reported Neurological Conditions: Dementia Neurological Management Strategies: Medication therapy (moved in with daughter 8 years ago, home made accessible, medicine has helped) Neurological Self-Management Outcome: 4 (good)  HEENT HEENT Symptoms Reported: No symptoms reported      Cardiovascular Cardiovascular Symptoms Reported: No symptoms reported    Respiratory Respiratory Symptoms Reported: Wheezing Additional Respiratory Details: echo-cardiogram in February 2025, prescribed lasix  Respiratory Self-Management Outcome: 4 (good)  Endocrine Patient reports the following symptoms related to hypoglycemia or hyperglycemia : No symptoms reported Is patient diabetic?: No Endocrine Conditions: Osteoporosis Endocrine Management Strategies: Medication therapy  Gastrointestinal Gastrointestinal Symptoms Reported: Incontinence      Genitourinary Genitourinary Symptoms Reported: Incontinence    Integumentary Integumentary Symptoms Reported: No symptoms reported    Musculoskeletal Musculoskelatal Symptoms Reviewed: Difficulty walking,  Unsteady gait        Psychosocial       Quality of Family Relationships: supportive Do you feel physically threatened by others?: No (per granddaughter)      04/17/2024    3:00 PM  Depression screen PHQ 2/9  Decreased Interest 0  Down, Depressed, Hopeless 0  PHQ - 2 Score 0    There were no vitals filed for this visit.  Medications Reviewed Today     Reviewed by Ave Leisure, LCSW (Social Worker) on 04/17/24 at 1518  Med List Status: <None>   Medication Order Taking? Sig Documenting Provider Last Dose Status Informant  acetaminophen  (TYLENOL ) 325 MG tablet [id number] Yes Take 2 tablets (650 mg total) by mouth every 6 (six) hours as needed for mild pain (pain score 1-3 or temp > 100.5).  Patient taking differently: Take 650 mg by mouth every 6 (six) hours as needed for mild pain (pain score 1-3). Also takes 2 tablets at bedtime   Claretta Croft, MD Taking Active   alum & mag hydroxide-simeth (MAALOX/MYLANTA) 200-200-20 MG/5ML suspension [id number] Yes Take 30 mLs by mouth every 4 (four) hours as needed for indigestion. Claretta Croft, MD Taking Active   brexpiprazole  (REXULTI ) 1 MG TABS tablet [id number] Yes Take 1 tablet daily Bedsole, Amy E, MD Taking Active   Calcium Carbonate-Vitamin D  (CALCIUM-D PO) [id number] Yes Take 1 tablet by mouth daily. [provider] Taking Active   Cholecalciferol  (VITAMIN D3 PO) [id number] Yes Take 1,000 Units daily by mouth.  [provider] Taking Active Child  docusate sodium  (COLACE) 100 MG capsule [id number] Yes Take 1 capsule (100 mg total) by mouth 2 (two) times daily. Claretta Croft, MD Taking Active   donepezil  (ARICEPT ) 10 MG tablet [id number] Yes TAKE ONE TABLET BY MOUTH EVERY NIGHT  AT BEDTIME Tower, Manley Seeds, MD Taking Active   furosemide  (LASIX ) 20 MG tablet [id number] No 1 tablet daily along with potassium x 3 days  Patient not taking: Reported on 04/17/2024   Clemens Curt, MD Not Taking Active   losartan  (COZAAR ) 50  MG tablet [id number] Yes TAKE ONE TABLET (50 MG TOTAL) BY MOUTH DAILY. Tower, Manley Seeds, MD Taking Active   melatonin 3 MG TABS tablet [id number] Yes Take 1 tablet (3 mg total) by mouth at bedtime as needed (sleep). Claretta Croft, MD Taking Active   memantine  (NAMENDA ) 5 MG tablet [id number] Yes TAKE ONE TABLET BY MOUTH TWICE A DAY Tower, Manley Seeds, MD Taking Active   metoprolol  succinate (TOPROL -XL) 50 MG 24 hr tablet [id number] Yes TAKE 1 TABLET BY MOUTH ONCE A DAY WITH OR IMMEDIATELY FOLLOWING A MEAL Tower, Manley Seeds, MD Taking Active   simvastatin  (ZOCOR ) 20 MG tablet [id number] Yes  [provider] Taking Active             Recommendation:   CSW to request updated FL2 for placement purposes  Follow Up Plan:   Telephone follow-up 05/08/24  Michaelle Adolphus, LCSW [place]  [facility], [facility] Licensed Clinical Social Worker Care Coordinator  Direct Dial: [phone]

## 2024-04-18 NOTE — Patient Instructions (Signed)
 Visit Information  Thank you for taking time to visit with me today. Please don't hesitate to contact me if I can be of assistance to you before our next scheduled appointment.  Our next appointment is by telephone on 05/08/24 at 9am Please call the care guide team at [phone] if you need to cancel or reschedule your appointment.   Following is a copy of your care plan:   Goals Addressed             This Visit's Progress    VBCI Social Work Care Plan       Problems:   Level of Care Concerns:Facility placement (patient's granddaughter interested in facility care for patient)  CSW Clinical Goal(s):   Over the next 90 days the Caregiver will explore community resource options for unmet needs related to placement in a memory care facillity .  Interventions:  Level of Care Concerns in a patient with Dementia Current level of care: Home with other family or significant other(s): family member: granddaughter, daughter and son in law  Evaluation of patient's unmet needs in current living environment  Facility  Assessed needs and reviewed facility placement process; as well as the different levels of care Confirmed that patient's behaviors were beginning to stabilize with medications-placement not as urgent, however granddaughter would like to start the placement processs Collaborated with [facility], patient placed on waiting list-contacted [facility], no beds available-left message with intake coordinator to add patient to waiting list Discussed payment options for placement :Encouraged granddaughter to schedule tour for [facility] and [facility] as well as to start application process for long term care Medicaid through the Department of Social Services   Patient Goals/Self-Care Activities:  Call Department of Social Services to start the long term care Medicaid process             Contact facilities of choice to schedule tour and to discuss placement  options.  Plan:   Telephone follow up appointment with care management team member scheduled for:  05/08/24        Please call 911 if you are experiencing a Mental Health or Behavioral Health Crisis or need someone to talk to.  Patient verbalizes understanding of instructions and care plan provided today and agrees to view in MyChart. Active MyChart status and patient understanding of how to access instructions and care plan via MyChart confirmed with patient.     Khallid Pasillas, LCSW [place]  [facility], [facility] Licensed Clinical Social Worker Care Coordinator  Direct Dial: [phone]

## 2024-05-08 ENCOUNTER — Other Ambulatory Visit: Payer: Self-pay | Admitting: *Deleted

## 2024-05-08 NOTE — Patient Outreach (Signed)
 Complex Care Management   Visit Note  05/08/2024  Name:  Amanda Salas MRN: [id number] DOB: 04-23-1927  Situation: Referral received for Complex Care Management related to Dementia I obtained verbal consent from Caregiver.  Visit completed with caregiver-granddaughter  on the phone  Background:   Past Medical History:  Diagnosis Date   Allergy    Arthritis    Cataract    Dementia (HCC)    Disorder of bone and cartilage, unspecified    GERD (gastroesophageal reflux disease)    Hepatitis    Hyperlipidemia    Hypertension    Thrombocytopenia, unspecified (HCC)     Assessment: Patient Reported Symptoms:  Cognitive Cognitive Status: Able to follow simple commands, Poor judgment in daily scenarios, Struggling with memory recall (patient has been diagnosed with dementia)   Health Maintenance Behaviors: Annual physical exam  Neurological Neurological Review of Symptoms: No symptoms reported    HEENT HEENT Symptoms Reported: No symptoms reported      Cardiovascular Cardiovascular Symptoms Reported: No symptoms reported    Respiratory Respiratory Symptoms Reported: No symptoms reported Additional Respiratory Details: wheezinf has improved    Endocrine Patient reports the following symptoms related to hypoglycemia or hyperglycemia : No symptoms reported    Gastrointestinal Gastrointestinal Symptoms Reported: Incontinence      Genitourinary Genitourinary Symptoms Reported: Incontinence Genitourinary Management Strategies: Incontinence garment/pad  Integumentary Integumentary Symptoms Reported: No symptoms reported    Musculoskeletal Musculoskelatal Symptoms Reviewed: Difficulty walking, Unsteady gait Additional Musculoskeletal Details: uses a walker while ambulating Musculoskeletal Conditions: Mobility limited Musculoskeletal Management Strategies: Adequate rest      Psychosocial Psychosocial Symptoms Reported: No symptoms reported            04/17/2024    3:00 PM   Depression screen PHQ 2/9  Decreased Interest 0  Down, Depressed, Hopeless 0  PHQ - 2 Score 0    There were no vitals filed for this visit.  Medications Reviewed Today   Medications were not reviewed in this encounter     Recommendation:   PCP Follow-up  Follow Up Plan:   Patient has met all care management goals. Care Management case will be closed. Patient has been provided contact information should new needs arise.   Margit Batte, LCSW [place]  [facility], [facility] Licensed Clinical Social Worker Care Coordinator  Direct Dial: [phone]

## 2024-05-08 NOTE — Patient Instructions (Signed)
 Visit Information  Thank you for taking time to visit with me today. Please don't hesitate to contact me if I can be of assistance to you before our next scheduled appointment.   Patient has met all care management goals. Care Management case will be closed. Patient has been provided contact information should new needs arise.   Please call the care guide team at [phone] if you need to cancel, schedule, or reschedule an appointment.   Please call 911 if you are experiencing a Mental Health or Behavioral Health Crisis or need someone to talk to.  Louann Hopson, LCSW [place]  [facility], [facility] Licensed Clinical Social Worker Care Coordinator  Direct Dial: [phone]

## 2024-05-22 DIAGNOSIS — K219 Gastro-esophageal reflux disease without esophagitis: Secondary | ICD-10-CM | POA: Diagnosis not present

## 2024-05-22 DIAGNOSIS — N183 Chronic kidney disease, stage 3 unspecified: Secondary | ICD-10-CM | POA: Diagnosis not present

## 2024-06-08 ENCOUNTER — Other Ambulatory Visit: Payer: Self-pay | Admitting: Family Medicine

## 2024-06-19 ENCOUNTER — Encounter: Payer: Self-pay | Admitting: Family Medicine

## 2024-06-20 DIAGNOSIS — N183 Chronic kidney disease, stage 3 unspecified: Secondary | ICD-10-CM | POA: Diagnosis not present

## 2024-06-20 DIAGNOSIS — K219 Gastro-esophageal reflux disease without esophagitis: Secondary | ICD-10-CM | POA: Diagnosis not present

## 2024-06-26 ENCOUNTER — Telehealth: Payer: Self-pay

## 2024-06-26 NOTE — Telephone Encounter (Signed)
 Added from my chart message.   Thank you for getting back with us  and always offering a solution!  We did get a pill cutter and giving her half-a-pill now.  She doesn't have any signs of a UTI.  She's had a good week so far but I will reach out to you if anything changes. As for the medicine for bathroom problems, I will try to reach back out to [facility] and see if she can tell me.  Thanks again!

## 2024-06-26 NOTE — Telephone Encounter (Signed)
 Good Reassuring Aware

## 2024-06-28 ENCOUNTER — Other Ambulatory Visit: Payer: Self-pay | Admitting: Family Medicine

## 2024-06-29 ENCOUNTER — Telehealth: Payer: Self-pay

## 2024-06-29 NOTE — Telephone Encounter (Signed)
 Copied from CRM [id number]. Topic: Clinical - Medication Question >> Jun 29, 2024  1:34 PM Chasity T wrote: Reason for CRM: Josh from [employer] [employer] is calling in for patient medication brexpiprazole  (REXULTI ) 1 MG TABS tablet that was sent on today. He states that they need to know if she is being decreased to 0.5 mg per day and if she is then a prescription will need to be sent for 0.5 mg tablet once per day 30 day supply to the pharmacy. Due to insurance they will not pay over 30 days.

## 2024-06-29 NOTE — Telephone Encounter (Signed)
 Last filled on 01/13/24 #30 tab/ 3 refill Last OV was on 02/16/24

## 2024-06-30 MED ORDER — REXULTI 0.5 MG PO TABS: 0.5000 mg | ORAL_TABLET | Freq: Every day | ORAL | 3 refills | Status: DC

## 2024-06-30 NOTE — Telephone Encounter (Signed)
 She did go down to 0.5 mg daily   I sent it  Thanks

## 2024-06-30 NOTE — Addendum Note (Signed)
 Addended by: RANDEEN HARDY A on: 06/30/2024 10:49 AM   Modules accepted: Orders

## 2024-06-30 NOTE — Telephone Encounter (Signed)
 Left VM letting Pharmacy know Dr. Graham comments and that new Rx sent to pharmacy

## 2024-07-12 ENCOUNTER — Other Ambulatory Visit (HOSPITAL_COMMUNITY): Payer: Self-pay

## 2024-07-12 ENCOUNTER — Telehealth: Payer: Self-pay

## 2024-07-12 NOTE — Telephone Encounter (Signed)
 Pharmacy Patient Advocate Encounter  Received notification from [facility] that Prior Authorization for Rexulti  1MG  tablets AND Rexulti  0.5 MG tablets has been APPROVED Unable to obtain price due to refill too soon rejection, last fill date 06/30/2024  FOR REXULTI  0.5 MG next available fill date08/02/2024

## 2024-08-22 ENCOUNTER — Other Ambulatory Visit: Payer: Self-pay | Admitting: Family Medicine

## 2024-11-09 ENCOUNTER — Other Ambulatory Visit: Payer: Self-pay | Admitting: Family Medicine

## 2024-11-13 ENCOUNTER — Other Ambulatory Visit: Payer: Self-pay | Admitting: Family Medicine

## 2024-12-05 ENCOUNTER — Other Ambulatory Visit: Payer: Self-pay | Admitting: Family Medicine

## 2024-12-05 ENCOUNTER — Encounter: Payer: Self-pay | Admitting: Family Medicine

## 2024-12-05 MED ORDER — REXULTI 0.5 MG PO TABS: 0.5000 mg | ORAL_TABLET | Freq: Every day | ORAL | 3 refills | Status: AC

## 2024-12-05 NOTE — Telephone Encounter (Signed)
 F/u was on 2/29/25, no future appts.  Namenda  last filled on 06/09/24 #180 tab/ 1 refill  Rexulti  filled on 06/30/24 #30 tab/ 3 refills

## 2024-12-06 ENCOUNTER — Other Ambulatory Visit (HOSPITAL_COMMUNITY): Payer: Self-pay

## 2024-12-06 ENCOUNTER — Telehealth: Payer: Self-pay | Admitting: *Deleted

## 2024-12-08 ENCOUNTER — Other Ambulatory Visit (HOSPITAL_COMMUNITY): Payer: Self-pay

## 2024-12-08 ENCOUNTER — Telehealth: Payer: Self-pay

## 2024-12-08 NOTE — Telephone Encounter (Signed)
 Pharmacy Patient Advocate Encounter   Received notification from Onbase that prior authorization for Rexulti  0.5 tabs is required/requested.   Insurance verification completed.   The patient is insured through [facility]    Per test claim: Refill too soon. PA is not needed at this time. Medication was filled 11/20/24. Next eligible fill date is 12/13/24.

## 2024-12-12 ENCOUNTER — Telehealth: Payer: Self-pay | Admitting: Family Medicine

## 2024-12-12 ENCOUNTER — Telehealth: Payer: Self-pay | Admitting: *Deleted

## 2024-12-12 NOTE — Telephone Encounter (Signed)
 Copied from CRM [id number]. Topic: General - Other >> Dec 12, 2024 10:18 AM Aleatha C wrote: Reason for CRM: Patient Granddaughter called to see where was the paperwork sent to for the  [facility], [facility] and an H&P forms please call her and follow up  [phone] >> Dec 12, 2024 12:30 PM Donna BRAVO wrote: Clayborne Ester calling, regarding  FL2, MAR and an H&P forms for [facility]. There is an opening for the patient. These forms need filled out and sent to  [facility] care facility.    this is an urgent matter, there is a spot open for patient.    Read Note Verbatim:  Rayleen Glendora HERO, CMA    12/12/24  8:38 AM Note See prev note, FL2 form placed in your inbox, I attached the last OV note and med list with form  Clayborne stated this is an urgent matter And needs to be done by end of day.   Clayborne would like a call back when forms have been faxed or if she needs to pick up forms.

## 2024-12-12 NOTE — Telephone Encounter (Signed)
 Finished and family is picking form up

## 2024-12-12 NOTE — Telephone Encounter (Signed)
 Duplicate note, will address once PCP fills out FL2 form.

## 2024-12-12 NOTE — Telephone Encounter (Signed)
 See prev note, FL2 form placed in your inbox, I attached the last OV note and med list with form

## 2024-12-12 NOTE — Telephone Encounter (Signed)
 In IN box  Finish questions please/ att med list or fill in  Thanks

## 2024-12-12 NOTE — Telephone Encounter (Signed)
 Copied from CRM [id number]. Topic: General - Other >> Dec 12, 2024 10:18 AM Aleatha BROCKS wrote: Reason for CRM: Patient Granddaughter called to see where was the paperwork sent to for the  [facility], [facility] and an H&P forms please call her and follow up  [phone]

## 2024-12-12 NOTE — Telephone Encounter (Signed)
 Copied from CRM [id number]. Topic: Clinical - Medical Advice >> Dec 11, 2024  4:32 PM Alexandria E wrote: Reason for CRM: Patient's granddaughter, Clayborne, called wanting to speak with PCP's nurse. They are trying to get the patient into a facility, [facility], and they currently have one memory care bed available. Clayborne stated they are needing an FL2, MAR and an H&P to get her grandmother admitted.

## 2024-12-12 NOTE — Telephone Encounter (Signed)
 Duplicate request

## 2024-12-22 ENCOUNTER — Encounter: Payer: Self-pay | Admitting: Family Medicine

## 2024-12-22 ENCOUNTER — Telehealth: Payer: Self-pay | Admitting: Family Medicine

## 2024-12-22 NOTE — Telephone Encounter (Signed)
 Copied from CRM [id number]. Topic: General - Other >> Dec 12, 2024 10:18 AM Aleatha BROCKS wrote: Reason for CRM: Patient Granddaughter called to see where was the paperwork sent to for the  [facility], [facility] and an H&P forms please call her and follow up  [phone]

## 2024-12-22 NOTE — Telephone Encounter (Signed)
 Copied from CRM [id number]. Topic: General - Other >> Dec 12, 2024 10:18 AM Aleatha C wrote: Reason for CRM: Patient Granddaughter called to see where was the paperwork sent to for the  [facility], [place] and an H&P forms please call her and follow up  [phone] >> Dec 22, 2024  8:55 AM Montie POUR wrote: Mitzie, daughter, is calling and Ms. Chan Rosasco is on the incorrect form. It needs to be on the long term care form. Please call Mitzie at [phone] when this is completed. She needs it today as soon as possible because Ms. Cremeens will be admitting on Monday.   >> Dec 12, 2024 12:30 PM Donna BRAVO wrote: Clayborne Ester calling, regarding  FL2, MAR and an H&P forms for [facility]. There is an opening for the patient. These forms need filled out and sent to  [facility] care facility.    this is an urgent matter, there is a spot open for patient.    Read Note Verbatim:  Rayleen Glendora HERO, CMA    12/12/24  8:38 AM Note See prev note, FL2 form placed in your inbox, I attached the last OV note and med list with form  Clayborne stated this is an urgent matter And needs to be done by end of day.   Clayborne would like a call back when forms have been faxed or if she needs to pick up forms.

## 2024-12-22 NOTE — Telephone Encounter (Signed)
 Done and in IN box

## 2024-12-22 NOTE — Telephone Encounter (Signed)
"  Duplicate encounter.   "

## 2024-12-22 NOTE — Telephone Encounter (Signed)
 Form completed with Granddaughter and placed at the front for pick up, family aware

## 2024-12-22 NOTE — Telephone Encounter (Signed)
 Long Term Care [facility] printed of the website. Placed in PCP's inbox for review (attached med list)

## 2024-12-25 NOTE — Telephone Encounter (Unsigned)
 Copied from CRM [id number]. Topic: General - Other >> Dec 12, 2024 10:18 AM Aleatha C wrote: Reason for CRM: Patient Granddaughter called to see where was the paperwork sent to for the  [facility], [place] and an H&P forms please call her and follow up  [phone] >> Dec 22, 2024  8:55 AM Montie POUR wrote: Mitzie, daughter, is calling and Ms. Chan Rosasco is on the incorrect form. It needs to be on the long term care form. Please call Mitzie at [phone] when this is completed. She needs it today as soon as possible because Ms. Cremeens will be admitting on Monday.   >> Dec 12, 2024 12:30 PM Donna BRAVO wrote: Clayborne Ester calling, regarding  FL2, MAR and an H&P forms for [facility]. There is an opening for the patient. These forms need filled out and sent to  [facility] care facility.    this is an urgent matter, there is a spot open for patient.    Read Note Verbatim:  Rayleen Glendora HERO, CMA    12/12/24  8:38 AM Note See prev note, FL2 form placed in your inbox, I attached the last OV note and med list with form  Clayborne stated this is an urgent matter And needs to be done by end of day.   Clayborne would like a call back when forms have been faxed or if she needs to pick up forms.

## 2024-12-25 NOTE — Telephone Encounter (Signed)
 I did the 2nd (long term) form last week as soon as wel got it  Shapale sent it- she is not here today Can someone please check on this?

## 2024-12-27 NOTE — Telephone Encounter (Signed)
 This is a duplicate encounter, forms have been completed

## 2025-01-23 ENCOUNTER — Other Ambulatory Visit: Payer: Self-pay

## 2025-01-23 ENCOUNTER — Emergency Department: Admission: EM | Admit: 2025-01-23 | Discharge: 2025-01-23 | Disposition: A | Discharge status: Home / Self Care

## 2025-01-23 ENCOUNTER — Emergency Department

## 2025-01-23 DIAGNOSIS — I129 Hypertensive chronic kidney disease with stage 1 through stage 4 chronic kidney disease, or unspecified chronic kidney disease: Secondary | ICD-10-CM | POA: Insufficient documentation

## 2025-01-23 DIAGNOSIS — S2231XA Fracture of one rib, right side, initial encounter for closed fracture: Secondary | ICD-10-CM | POA: Insufficient documentation

## 2025-01-23 DIAGNOSIS — N189 Chronic kidney disease, unspecified: Secondary | ICD-10-CM | POA: Insufficient documentation

## 2025-01-23 DIAGNOSIS — W07XXXA Fall from chair, initial encounter: Secondary | ICD-10-CM | POA: Insufficient documentation

## 2025-01-23 MED ORDER — LIDOCAINE 5 % EX PTCH
1.0000 | MEDICATED_PATCH | CUTANEOUS | Status: DC
  Administered 2025-01-23: 1 via TRANSDERMAL
  Filled 2025-01-23: qty 1

## 2025-01-23 MED ORDER — LIDOCAINE 5 % EX PTCH: 1.0000 | MEDICATED_PATCH | CUTANEOUS | 0 refills | Status: AC

## 2025-01-23 NOTE — ED Triage Notes (Signed)
 Pt comes via EMS from [facility]. Pt fell on Saturday. Pt had scans and showed rib fractures on right side. Pt is '[id number].  Pt states pain to rib area especially with movement.

## 2025-03-27 ENCOUNTER — Ambulatory Visit
